# Patient Record
Sex: Female | Born: 1985 | Race: Black or African American | Hispanic: No | Marital: Married | Smoking: Never smoker
Health system: Southern US, Community
[De-identification: ages and names within clinical notes are randomized; demographics above are authoritative.]

## PROBLEM LIST (undated history)

## (undated) DIAGNOSIS — M2550 Pain in unspecified joint: Secondary | ICD-10-CM

## (undated) DIAGNOSIS — M7989 Other specified soft tissue disorders: Secondary | ICD-10-CM

## (undated) DIAGNOSIS — E559 Vitamin D deficiency, unspecified: Secondary | ICD-10-CM

## (undated) DIAGNOSIS — D649 Anemia, unspecified: Secondary | ICD-10-CM

## (undated) DIAGNOSIS — K5900 Constipation, unspecified: Secondary | ICD-10-CM

## (undated) DIAGNOSIS — Z8632 Personal history of gestational diabetes: Principal | ICD-10-CM

## (undated) HISTORY — DX: Personal history of gestational diabetes: Z86.32

## (undated) HISTORY — DX: Anemia, unspecified: D64.9

## (undated) HISTORY — DX: Vitamin D deficiency, unspecified: E55.9

## (undated) HISTORY — DX: Constipation, unspecified: K59.00

## (undated) HISTORY — DX: Other specified soft tissue disorders: M79.89

## (undated) HISTORY — DX: Pain in unspecified joint: M25.50

## (undated) HISTORY — PX: NO PAST SURGERIES: SHX2092

---

## 2016-03-29 NOTE — L&D Delivery Note (Signed)
Patient is a 31 y.o. now [id number] s/p NSVD at [redacted]w[redacted]d, who was admitted for IOL for A1GDM, started on IV Pitocin 080 this morning, and progressed quickly.   Delivery Note Patient SROM at 12:12 PM with clear fluid, and quickly delivered at 12:13 PM a viable female was delivered via Vaginal, Spontaneous Delivery. Arrived in the room, baby had just delivered and placenta quickly delivered spontaneously. Infant with good tone and spontaneous cry; dried and bulb suctioned. Cord clamped x 2 after 1-minute delay, and cut by family member. Cord blood drawn Fundus firm with massage and Pitocin. Perineum inspected and found to have no lacerations.  APGAR: 8, 9; weight pending. Placenta status: intact, sent to L&D; 3-vessel cord  Anesthesia:  Epidural Episiotomy: None Lacerations: None Suture Repair: none Est. Blood Loss (mL): 50  Mom to postpartum.  Baby to Couplet care / Skin to Skin.  Raynelle Fanning P. Degele, MD OB Fellow 12/15/16, 12:32 PM

## 2016-05-19 LAB — OB RESULTS CONSOLE GC/CHLAMYDIA
CHLAMYDIA, DNA PROBE: NEGATIVE
Gonorrhea: NEGATIVE

## 2016-05-19 LAB — OB RESULTS CONSOLE RPR: RPR: NONREACTIVE

## 2016-05-19 LAB — OB RESULTS CONSOLE ABO/RH: RH Type: POSITIVE

## 2016-05-19 LAB — OB RESULTS CONSOLE HGB/HCT, BLOOD
HCT: 33
Hemoglobin: 10.9

## 2016-05-19 LAB — OB RESULTS CONSOLE HIV ANTIBODY (ROUTINE TESTING): HIV: NONREACTIVE

## 2016-05-19 LAB — OB RESULTS CONSOLE ANTIBODY SCREEN: Antibody Screen: NEGATIVE

## 2016-05-19 LAB — OB RESULTS CONSOLE HEPATITIS B SURFACE ANTIGEN: HEP B S AG: NEGATIVE

## 2016-05-19 LAB — CYTOLOGY - PAP: PAP SMEAR: NEGATIVE

## 2016-05-19 LAB — OB RESULTS CONSOLE PLATELET COUNT: Platelets: 400

## 2016-05-19 LAB — OB RESULTS CONSOLE RUBELLA ANTIBODY, IGM: RUBELLA: IMMUNE

## 2016-10-21 ENCOUNTER — Ambulatory Visit (INDEPENDENT_AMBULATORY_CARE_PROVIDER_SITE_OTHER): Payer: BLUE CROSS/BLUE SHIELD | Admitting: Obstetrics and Gynecology

## 2016-10-21 ENCOUNTER — Encounter: Payer: Self-pay | Admitting: *Deleted

## 2016-10-21 ENCOUNTER — Encounter: Payer: Self-pay | Admitting: Obstetrics and Gynecology

## 2016-10-21 DIAGNOSIS — Z3480 Encounter for supervision of other normal pregnancy, unspecified trimester: Secondary | ICD-10-CM

## 2016-10-21 DIAGNOSIS — Z3483 Encounter for supervision of other normal pregnancy, third trimester: Secondary | ICD-10-CM

## 2016-10-21 DIAGNOSIS — Z6841 Body Mass Index (BMI) 40.0 and over, adult: Secondary | ICD-10-CM | POA: Insufficient documentation

## 2016-10-21 DIAGNOSIS — O0990 Supervision of high risk pregnancy, unspecified, unspecified trimester: Secondary | ICD-10-CM | POA: Insufficient documentation

## 2016-10-21 DIAGNOSIS — O99213 Obesity complicating pregnancy, third trimester: Secondary | ICD-10-CM | POA: Insufficient documentation

## 2016-10-21 DIAGNOSIS — E669 Obesity, unspecified: Secondary | ICD-10-CM | POA: Insufficient documentation

## 2016-10-21 NOTE — Progress Notes (Signed)
Prenatal Visit Note Date: 10/21/2016 [facility]  NOB  Transfer of [place] from [place] b/c father is ill  Subjective:  Cassandra Vazquez is a 31 y.o. G1P0 at 10330w1d being seen today for ongoing prenatal care.  She is currently monitored for the following issues for this high-risk pregnancy and has Supervision of other normal pregnancy, antepartum; Obesity in pregnancy, antepartum, third trimester; and BMI 45.0-49.9, adult (HCC) on her problem list.  Patient reports no complaints.   Contractions: Irregular. Vag. Bleeding: None.  Movement: Present. Denies leaking of fluid.   The following portions of the patient's history were reviewed and updated as appropriate: allergies, current medications, past family history, past medical history, past social history, past surgical history and problem list. Problem list updated.  Objective:   Vitals:   10/21/16 0825 10/21/16 0858  BP: 107/67   Pulse: 96   Temp: 98 F (36.7 C)   Weight: 272 lb (123.4 kg)   Height:  5\' 4"  (1.626 m)    Fetal Status: Fetal Heart Rate (bpm): 150s Fundal Height: 30 cm Movement: Present     General:  Alert, oriented and cooperative. Patient is in no acute distress.  Skin: Skin is warm and dry. No rash noted.   Cardiovascular: Normal heart rate noted  Respiratory: Normal respiratory effort, no problems with respiration noted  Abdomen: Soft, gravid, appropriate for gestational age. Pain/Pressure: Present     Pelvic:  Cervical exam deferred        Extremities: Normal range of motion.     Mental Status: Normal mood and affect. Normal behavior. Normal judgment and thought content.   Urinalysis:      Assessment and Plan:  Pregnancy: G1P0 at 5230w1d  1. Supervision of other normal pregnancy, antepartum Routine care. Pt didn't have an anatomy u/s. A limited u/s showed normal placenta and no previa. OCPs D/w her re: practice, hospital, residents, CNMs, etc - Glucose Tolerance, 2 Hours  w/1 Hour - CBC - RPR - HIV antibody (with reflex) - US MFM OB DETAIL +14 WK; Future  2. Obesity in pregnancy, antepartum, third trimester Routine care  3. BMI 45.0-49.9, adult (HCC) See above.   Preterm labor symptoms and general obstetric precautions including but not limited to vaginal bleeding, contractions, leaking of fluid and fetal movement were reviewed in detail with the patient. Please refer to After Visit Summary for other counseling recommendations.  Return in about 2 weeks (around 11/04/2016).   Walden BingPickens, Glendal Cassaday, MD

## 2016-10-22 ENCOUNTER — Encounter: Payer: Self-pay | Admitting: Obstetrics and Gynecology

## 2016-10-22 DIAGNOSIS — Z8632 Personal history of gestational diabetes: Secondary | ICD-10-CM

## 2016-10-22 DIAGNOSIS — O09299 Supervision of pregnancy with other poor reproductive or obstetric history, unspecified trimester: Secondary | ICD-10-CM

## 2016-10-22 HISTORY — DX: Personal history of gestational diabetes: Z86.32

## 2016-10-22 HISTORY — DX: Supervision of pregnancy with other poor reproductive or obstetric history, unspecified trimester: O09.299

## 2016-10-22 LAB — GLUCOSE TOLERANCE, 2 HOURS W/ 1HR
GLUCOSE, 1 HOUR: 180 mg/dL — AB (ref 65–179)
GLUCOSE, 2 HOUR: 164 mg/dL — AB (ref 65–152)
Glucose, Fasting: 84 mg/dL (ref 65–91)

## 2016-10-22 LAB — CBC
HEMATOCRIT: 31.8 % — AB (ref 34.0–46.6)
HEMOGLOBIN: 10.3 g/dL — AB (ref 11.1–15.9)
MCH: 26.3 pg — AB (ref 26.6–33.0)
MCHC: 32.4 g/dL (ref 31.5–35.7)
MCV: 81 fL (ref 79–97)
Platelets: 357 10*3/uL (ref 150–379)
RBC: 3.92 x10E6/uL (ref 3.77–5.28)
RDW: 14.1 % (ref 12.3–15.4)
WBC: 10.8 10*3/uL (ref 3.4–10.8)

## 2016-10-22 LAB — RPR: RPR: NONREACTIVE

## 2016-10-22 LAB — HIV ANTIBODY (ROUTINE TESTING W REFLEX): HIV SCREEN 4TH GENERATION: NONREACTIVE

## 2016-10-25 ENCOUNTER — Ambulatory Visit (HOSPITAL_COMMUNITY)
Admission: RE | Admit: 2016-10-25 | Discharge: 2016-10-25 | Disposition: A | Discharge status: Home / Self Care | Payer: BLUE CROSS/BLUE SHIELD | Source: Ambulatory Visit | Attending: Obstetrics and Gynecology | Admitting: Obstetrics and Gynecology

## 2016-10-25 ENCOUNTER — Telehealth: Payer: Self-pay | Admitting: *Deleted

## 2016-10-25 DIAGNOSIS — O321XX0 Maternal care for breech presentation, not applicable or unspecified: Secondary | ICD-10-CM | POA: Diagnosis not present

## 2016-10-25 DIAGNOSIS — E669 Obesity, unspecified: Secondary | ICD-10-CM | POA: Diagnosis not present

## 2016-10-25 DIAGNOSIS — O99213 Obesity complicating pregnancy, third trimester: Secondary | ICD-10-CM | POA: Insufficient documentation

## 2016-10-25 DIAGNOSIS — Z3A31 31 weeks gestation of pregnancy: Secondary | ICD-10-CM | POA: Insufficient documentation

## 2016-10-25 DIAGNOSIS — Z369 Encounter for antenatal screening, unspecified: Secondary | ICD-10-CM | POA: Diagnosis not present

## 2016-10-25 DIAGNOSIS — Z3483 Encounter for supervision of other normal pregnancy, third trimester: Secondary | ICD-10-CM | POA: Diagnosis present

## 2016-10-25 DIAGNOSIS — Z3480 Encounter for supervision of other normal pregnancy, unspecified trimester: Secondary | ICD-10-CM

## 2016-10-25 DIAGNOSIS — O24419 Gestational diabetes mellitus in pregnancy, unspecified control: Secondary | ICD-10-CM

## 2016-10-25 MED ORDER — ACCU-CHEK FASTCLIX LANCETS MISC: 1.0000 [IU] | Freq: Four times a day (QID) | 12 refills | Status: DC

## 2016-10-25 MED ORDER — ACCU-CHEK NANO SMARTVIEW W/DEVICE KIT: 1.0000 | PACK | 0 refills | Status: DC

## 2016-10-25 MED ORDER — GLUCOSE BLOOD VI STRP: ORAL_STRIP | 12 refills | Status: DC

## 2016-10-25 NOTE — Telephone Encounter (Signed)
-----   Message from [place] Bingharlie Pickens, MD sent at 10/22/2016 10:54 AM EDT ----- Pt has gdm. Please set her up per protocol. thanks

## 2016-10-25 NOTE — Telephone Encounter (Signed)
Called pt to adv GDM - LM for her to rtn call.  Sent Supplies to pharmacy and placed referral to N&D

## 2016-11-03 ENCOUNTER — Encounter: Payer: BLUE CROSS/BLUE SHIELD | Attending: Obstetrics and Gynecology | Admitting: Registered"

## 2016-11-03 DIAGNOSIS — Z713 Dietary counseling and surveillance: Secondary | ICD-10-CM | POA: Diagnosis not present

## 2016-11-03 DIAGNOSIS — O24419 Gestational diabetes mellitus in pregnancy, unspecified control: Secondary | ICD-10-CM | POA: Diagnosis not present

## 2016-11-03 DIAGNOSIS — R7309 Other abnormal glucose: Secondary | ICD-10-CM

## 2016-11-04 ENCOUNTER — Encounter: Payer: Self-pay | Admitting: Registered"

## 2016-11-04 ENCOUNTER — Ambulatory Visit (INDEPENDENT_AMBULATORY_CARE_PROVIDER_SITE_OTHER): Payer: BLUE CROSS/BLUE SHIELD | Admitting: Obstetrics & Gynecology

## 2016-11-04 VITALS — BP 111/71 | HR 94 | Wt 269.0 lb

## 2016-11-04 DIAGNOSIS — Z23 Encounter for immunization: Secondary | ICD-10-CM | POA: Diagnosis not present

## 2016-11-04 DIAGNOSIS — O0993 Supervision of high risk pregnancy, unspecified, third trimester: Secondary | ICD-10-CM

## 2016-11-04 DIAGNOSIS — O24419 Gestational diabetes mellitus in pregnancy, unspecified control: Secondary | ICD-10-CM

## 2016-11-04 MED ORDER — GLUCOSE BLOOD VI STRP: ORAL_STRIP | 12 refills | Status: DC

## 2016-11-04 NOTE — Patient Instructions (Addendum)
Return to clinic for any scheduled appointments or obstetric concerns, or go to [facility]U for evaluation   Gestational Diabetes Mellitus, Self Care Caring for yourself after you have been diagnosed with gestational diabetes (gestational diabetes mellitus) means keeping your blood sugar (glucose) under control with a balance of:  Nutrition.  Exercise.  Lifestyle changes.  Medicines or insulin, if necessary.  Support from your team of health care providers and others.  The following information explains what you need to know to manage your gestational diabetes at home. What do I need to do to manage my blood glucose?  Check your blood glucose every day during your pregnancy. Do this as often as told by your health care provider.  Contact your health care provider if your blood glucose is above your target for 2 tests in a row. Your health care provider will set individualized treatment goals for you. Generally, the goal of treatment is to maintain the following blood glucose levels during pregnancy:  After not eating for 8 hours (after fasting): at or below 95 mg/dL (5.3 mmol/L).  After meals (postprandial): ? One hour after a meal: at or below 140 mg/dL (7.8 mmol/L). ? Two hours after a meal: at or below 120 mg/dL (6.7 mmol/L).  A1c (hemoglobin A1c) level: 6-6.5%.  What do I need to know about hyperglycemia and hypoglycemia? What is hyperglycemia? Hyperglycemia, also called high blood glucose, occurs when blood glucose is too high. Make sure you know the early signs of hyperglycemia, such as:  Increased thirst.  Hunger.  Feeling very tired.  Needing to urinate more often than usual.  Blurry vision.  What is hypoglycemia? Hypoglycemia, also called low blood glucose, occurswith a blood glucose level at or below 70 mg/dL (3.9 mmol/L). The risk for hypoglycemia increases during or after exercise, during sleep, during illness, and when skipping meals or not eating for a long time  (fasting). It is important to know the symptoms of hypoglycemia and treat it right away. Always have a 15-gram rapid-acting carbohydrate snack with you to treat low blood glucose.Family members and close friends should also know the symptoms and should understand how to treat hypoglycemia, in case you are not able to treat yourself. What are the symptoms of hypoglycemia? Hypoglycemia symptoms can include:  Hunger.  Anxiety.  Sweating and feeling clammy.  Confusion.  Dizziness or feeling light-headed.  Sleepiness.  Nausea.  Increased heart rate.  Headache.  Blurry vision.  Seizure.  Nightmares.  Tingling or numbness around the mouth, lips, or tongue.  A change in speech.  Decreased ability to concentrate.  A change in coordination.  Restless sleep.  Tremors or shakes.  Fainting.  Irritability.  How do I treat hypoglycemia?  If you are alert and able to swallow safely, follow the 15:15 rule:  Take 15 grams of a rapid-acting carbohydrate. Rapid-acting options include: ? 1 tube of glucose gel. ? 3 glucose pills. ? 6-8 pieces of hard candy. ? 4 oz (120 mL) of fruit juice. ? 4 oz (120 mL) of regular (not diet) soda.  Check your blood glucose 15 minutes after you take the carbohydrate.  If the repeat blood glucose level is still at or below 70 mg/dL (3.9 mmol/L), take 15 grams of a carbohydrate again.  If your blood glucose level does not increase above 70 mg/dL (3.9 mmol/L) after 3 tries, seek emergency medical care.  After your blood glucose level returns to normal, eat a meal or a snack within 1 hour.  How do  I treat severe hypoglycemia? Severe hypoglycemia is when your blood glucose level is at or below 54 mg/dL (3 mmol/L). Severe hypoglycemia is an emergency. Do not wait to see if the symptoms will go away. Get medical help right away. Call your local emergency services (911 in the [place] Do not drive yourself to the hospital. If you have severe  hypoglycemia and you cannot eat or drink, you may need an injection of glucagon. A family member or close friend should learn how to check your blood glucose and how to give you a glucagon injection. Ask your health care provider if you need to have an emergency glucagon injection kit available. Severe hypoglycemia may need to be treated in a hospital. The treatment may include getting glucose through an IV tube. You may also need treatment for the cause of your hypoglycemia. What else can I do to manage my gestational diabetes? Take your diabetes medicines as told  If your health care provider prescribed insulin or diabetes medicines, take them every day.  Do not run out of insulin or other diabetes medicines that you take. Plan ahead so you always have these available.  If you use insulin, adjust your dosage based on how physically active you are and what foods you eat. Your health care provider will tell you how to adjust your dosage. Make healthy food choices  The things that you eat and drink affect your blood glucose. Making good choices helps to control your diabetes and prevent other health problems. A healthy meal plan includes eating lean proteins, complex carbohydrates, fresh fruits and vegetables, low-fat dairy products, and healthy fats. Make an appointment to see a diet and nutrition specialist (registered dietitian) to help you create an eating plan that is right for you. Make sure that you:  Follow instructions from your health care provider about eating or drinking restrictions.  Drink enough fluid to keep your urine clear or pale yellow.  Eat healthy snacks between nutritious meals.  Track the carbohydrates that you eat. Do this by reading food labels and learning the standard serving sizes of foods.  Follow your sick day plan whenever you cannot eat or drink as usual. Make this plan in advance with your health care provider.  Stay active   Do at least 30 minutes of  physical activity a day, or as much physical activity as your health care provider recommends during your pregnancy. ? Doing 10 minutes of exercise 30 minutes after each meal may help to control postprandial blood glucose levels.  If you start a new exercise or activity, work with your health care provider to adjust your insulin, medicines, or food intake as needed. Make healthy lifestyle choices  Do not drink alcohol.  Do not use any tobacco products, such as cigarettes, chewing tobacco, and e-cigarettes. If you need help quitting, ask your health care provider.  Learn to manage stress. If you need help with this, ask your health care provider. Care for your body  Keep your immunizations up to date.  Brush your teeth and gums two times a day, and floss at least one time a day.  Visit your dentist at least once every 6 months.  Maintain a healthy weight during your pregnancy. General instructions   Take over-the-counter and prescription medicines only as told by your health care provider.  Talk with your health care provider about your risk for high blood pressure during pregnancy (preeclampsia or eclampsia).  Share your diabetes management plan with people in your  workplace, school, and household.  Check your urine for ketones during your pregnancy when you are ill and as told by your health care provider.  Carry a medical alert card or wear medical alert jewelry.  Ask your health care provider: ? Do I need to meet with a diabetes educator? ? Where can I find a support group for people with diabetes?  Keep all follow-up visits during your pregnancy (prenatal) and after delivery (postnatal) as told by your health care provider. This is important. Get the care that you need after delivery  Have your blood glucose level checked 4-12 weeks after delivery. This is done with an oral glucose tolerance test (OGTT).  Get screened for diabetes at least every 3 years, or as often as  told by your health care provider. Where to find more information: To learn more about gestational diabetes, visit:  American Diabetes Association (ADA): www.diabetes.org/diabetes-basics/gestational  Centers for Disease Control and Prevention (CDC): http://sanchez-watson.com/.pdf  This information is not intended to replace advice given to you by your health care provider. Make sure you discuss any questions you have with your health care provider. Document Released: 07/07/2015 Document Revised: 08/21/2015 Document Reviewed: 04/18/2015 Elsevier Interactive Patient Education  2017 [employer].

## 2016-11-04 NOTE — Progress Notes (Addendum)
   PRENATAL VISIT NOTE  Subjective:  Marjorie Whitelock is a [age] y.Myna Brighto. G3P2002 at 6414w1d being seen today for ongoing prenatal care.  She is currently monitored for the following issues for this high-risk pregnancy and has Supervision of high-risk pregnancy; Obesity in pregnancy, antepartum, third trimester; BMI 45.0-49.9, adult (HCC); and GDM (gestational diabetes mellitus) on her problem list.  Patient reports dark brown discharge x 1 episode, no blood. Feels it is dirt and sweat, does not see any more when she wipes.   Contractions: Not present. Vag. Bleeding: None.  Movement: Present. Denies leaking of fluid.   The following portions of the patient's history were reviewed and updated as appropriate: allergies, current medications, past family history, past medical history, past social history, past surgical history and problem list. Problem list updated.  Objective:   Vitals:   11/04/16 0845  BP: 111/71  Pulse: 94  Weight: 269 lb (122 kg)    Fetal Status: Fetal Heart Rate (bpm): 147 Fundal Height: 33 cm Movement: Present     General:  Alert, oriented and cooperative. Patient is in no acute distress.  Skin: Skin is warm and dry. No rash noted.   Cardiovascular: Normal heart rate noted  Respiratory: Normal respiratory effort, no problems with respiration noted  Abdomen: Soft, gravid, appropriate for gestational age.  Pain/Pressure: Absent     Pelvic: Cervical exam deferred        Extremities: Normal range of motion.  Edema: None  Mental Status:  Normal mood and affect. Normal behavior. Normal judgment and thought content.   Assessment and Plan:  Pregnancy: G3P2002 at 1929w1d  1. Gestational diabetes mellitus (GDM), antepartum, gestational diabetes method of control unspecified Just had DM education yesterday. Will evaluate blood sugars next visit.  Growth scan ordered 37-38 weeks. - glucose blood test strip; Use as instructed  Dispense: 100 each; Refill: 12 - US MFM OB FOLLOW UP;  Future  2. Supervision of high risk pregnancy in third trimester - Tdap vaccine greater than or equal to 29yo IM Preterm labor symptoms and general obstetric precautions including but not limited to vaginal bleeding, contractions, leaking of fluid and fetal movement were reviewed in detail with the patient. Please refer to After Visit Summary for other counseling recommendations.  Return in about 2 weeks (around 11/18/2016) for OB Visit.   Jaynie CollinsUgonna Naw Lasala, MD

## 2016-11-04 NOTE — Progress Notes (Signed)
Pt has dark brown vaginal discharge today

## 2016-11-04 NOTE — Addendum Note (Signed)
Addended by: Jaynie CollinsANYANWU, Connor Foxworthy A on: 11/04/2016 09:12 AM   Modules accepted: Orders

## 2016-11-04 NOTE — Progress Notes (Signed)
Patient was seen on 11/03/2016 for Gestational Diabetes self-management class at the [facility]. The following learning objectives were met by the patient during this course:   States the definition of Gestational Diabetes  States why dietary management is important in controlling blood glucose  Describes the effects each nutrient has on blood glucose levels  Demonstrates ability to create a balanced meal plan  Demonstrates carbohydrate counting   States when to check blood glucose levels  Demonstrates proper blood glucose monitoring techniques  States the effect of stress and exercise on blood glucose levels  States the importance of limiting caffeine and abstaining from alcohol and smoking  Blood glucose monitor given: Contour Next Lot # [id number] Exp: 2016-12-26 Blood glucose reading: 90  Patient instructed to monitor glucose levels: FBS: 60 - <95 1 hour: <140 2 hour: <120  Patient received handouts:  Nutrition Diabetes and Pregnancy  Carbohydrate Counting List  Patient will be seen for follow-up as needed.

## 2016-11-08 ENCOUNTER — Ambulatory Visit: Payer: BLUE CROSS/BLUE SHIELD | Admitting: Family Medicine

## 2016-11-08 ENCOUNTER — Ambulatory Visit (INDEPENDENT_AMBULATORY_CARE_PROVIDER_SITE_OTHER): Payer: BLUE CROSS/BLUE SHIELD | Admitting: Family

## 2016-11-08 ENCOUNTER — Encounter: Payer: Self-pay | Admitting: Family

## 2016-11-08 VITALS — BP 118/76 | HR 89 | Temp 98.1°F | Ht 64.0 in | Wt 273.2 lb

## 2016-11-08 DIAGNOSIS — Z111 Encounter for screening for respiratory tuberculosis: Secondary | ICD-10-CM

## 2016-11-08 DIAGNOSIS — Z0000 Encounter for general adult medical examination without abnormal findings: Secondary | ICD-10-CM | POA: Insufficient documentation

## 2016-11-08 DIAGNOSIS — O99213 Obesity complicating pregnancy, third trimester: Secondary | ICD-10-CM

## 2016-11-08 NOTE — Progress Notes (Signed)
Pre visit review using our clinic review tool, if applicable. No additional management support is needed unless otherwise documented below in the visit note. 

## 2016-11-08 NOTE — Patient Instructions (Signed)
Such a pleasure meeting you!  I wish you a healthy last 4 weeks of pregnancy and healthy baby.   Come back after and we will discuss nutrition :)

## 2016-11-08 NOTE — Progress Notes (Signed)
Subjective:    Patient ID: Cassandra Vazquez, female    DOB: 23-Dec-1985, 31 y.o.   MRN: [id number]  CC: [name] is a 31 y.o. female who presents today to establish care.    HPI: Pregnant- 36 weeks  Over all feels well, is tired and thinks her weight is contributing.   Frustrated by weight gain during this pregnancy and feels "unhealthy".  Tearful when thinks about her health and health of her parents from weight.   OB- follows with [facility] for pap smear  Would like form filled out for [employer]  Denies exertional chest pain or pressure, numbness or tingling radiating to left arm or jaw, palpitations, dizziness, frequent headaches, changes in vision, or shortness of breath.      HISTORY:  History reviewed. No pertinent past medical history. No past surgical history on file. Family History  Problem Relation Age of Onset  . Diabetes Mother   . Diabetes Father   . CVA Father     Allergies: Patient has no known allergies. Current Outpatient Prescriptions on File Prior to Visit  Medication Sig Dispense Refill  . ACCU-CHEK FASTCLIX LANCETS MISC 1 Units by Percutaneous route 4 (four) times daily. 100 each 12  . Blood Glucose Monitoring Suppl (ACCU-CHEK NANO SMARTVIEW) w/Device KIT 1 kit by Subdermal route as directed. Check blood sugars for fasting, and two hours after breakfast, lunch and dinner (4 checks daily) 1 kit 0  . glucose blood test strip Use as instructed 100 each 12   No current facility-administered medications on file prior to visit.     Social History  Substance Use Topics  . Smoking status: Never Smoker  . Smokeless tobacco: Never Used  . Alcohol use No    Review of Systems  Constitutional: Positive for fatigue. Negative for chills and fever.  Respiratory: Negative for cough.   Cardiovascular: Negative for chest pain and palpitations.  Gastrointestinal: Negative for nausea and vomiting.      Objective:    BP 118/76   Pulse 89    Temp 98.1 F (36.7 C) (Oral)   Ht _0  (1.626 m)   Wt 273 lb 3.2 oz (123.9 kg)   LMP 03/17/2016   SpO2 96%   BMI 46.89 kg/m  BP Readings from Last 3 Encounters:  11/08/16 118/76  11/04/16 111/71  10/21/16 107/67   Wt Readings from Last 3 Encounters:  11/08/16 273 lb 3.2 oz (123.9 kg)  11/04/16 269 lb (122 kg)  10/21/16 272 lb (123.4 kg)    Physical Exam  Constitutional: She appears well-developed and well-nourished.  Eyes: Conjunctivae are normal.  Cardiovascular: Normal rate, regular rhythm, normal heart sounds and normal pulses.   Pulmonary/Chest: Effort normal and breath sounds normal. She has no wheezes. She has no rhonchi. She has no rales.  Neurological: She is alert.  Skin: Skin is warm and dry.  Psychiatric: She has a normal mood and affect. Her speech is normal and behavior is normal. Thought content normal.  Vitals reviewed.      Assessment & Plan:   Problem List Items Addressed This Visit      Other   Obesity in pregnancy, antepartum, third trimester    Discussed with patient that  at this point, she should focus on delivering a healthy baby. Advised her to come back after baby and we would work together to get her back on a healthy track. We discussed nutrition consult and an exercise program.  PPD screening test - Primary    Pending form for [employer] after PPD read in 2 days.      Relevant Orders   PPD (Completed)       I am having Ms. Charnley maintain her ACCU-CHEK NANO SMARTVIEW, ACCU-CHEK FASTCLIX LANCETS, and glucose blood.   No orders of the defined types were placed in this encounter.   Return precautions given.   Risks, benefits, and alternatives of the medications and treatment plan prescribed today were discussed, and patient expressed understanding.   Education regarding symptom management and diagnosis given to patient on AVS.  Continue to follow with Burnard Hawthorne, FNP for routine health maintenance.     Marcos Eke and I agreed with plan.   Mable Paris, FNP

## 2016-11-08 NOTE — Assessment & Plan Note (Addendum)
Discussed with patient that  at this point, she should focus on delivering a healthy baby. Advised her to come back after baby and we would work together to get her back on a healthy track. We discussed nutrition consult and an exercise program.

## 2016-11-08 NOTE — Assessment & Plan Note (Signed)
Pending form for [employer] after PPD read in 2 days.

## 2016-11-10 ENCOUNTER — Ambulatory Visit: Payer: BLUE CROSS/BLUE SHIELD | Admitting: *Deleted

## 2016-11-10 DIAGNOSIS — Z111 Encounter for screening for respiratory tuberculosis: Secondary | ICD-10-CM

## 2016-11-10 LAB — TB SKIN TEST
Induration: 0 mm
TB SKIN TEST: NEGATIVE

## 2016-11-10 NOTE — Progress Notes (Signed)
Patient presented for PPD read.

## 2016-11-17 ENCOUNTER — Ambulatory Visit (INDEPENDENT_AMBULATORY_CARE_PROVIDER_SITE_OTHER): Payer: BLUE CROSS/BLUE SHIELD | Admitting: Family Medicine

## 2016-11-17 VITALS — BP 105/71 | HR 82 | Wt 273.0 lb

## 2016-11-17 DIAGNOSIS — O0993 Supervision of high risk pregnancy, unspecified, third trimester: Secondary | ICD-10-CM

## 2016-11-17 DIAGNOSIS — O24410 Gestational diabetes mellitus in pregnancy, diet controlled: Secondary | ICD-10-CM

## 2016-11-17 DIAGNOSIS — O99213 Obesity complicating pregnancy, third trimester: Secondary | ICD-10-CM

## 2016-11-17 NOTE — Progress Notes (Deleted)
   PRENATAL VISIT NOTE  Subjective:  Cassandra Vazquez is a 31 y.o. G3P2002 at [redacted]w[redacted]d being seen today for ongoing prenatal care.  She is currently monitored for the following issues for this {Blank single:19197::"high-risk","low-risk"} pregnancy and has Supervision of high-risk pregnancy; Obesity in pregnancy, antepartum, third trimester; BMI 45.0-49.9, adult (HCC); GDM (gestational diabetes mellitus); and PPD screening test on her problem list.  Patient reports {sx:14538}.  Contractions: Not present. Vag. Bleeding: None.  Movement: Present. Denies leaking of fluid.   Ran out early last week.  Fasting- 110s 2hr after eating 90s   The following portions of the patient's history were reviewed and updated as appropriate: allergies, current medications, past family history, past medical history, past social history, past surgical history and problem list. Problem list updated.  Objective:   Vitals:   11/17/16 1636  BP: 105/71  Pulse: 82  Weight: 273 lb (123.8 kg)    Fetal Status: Fetal Heart Rate (bpm): 150   Movement: Present     General:  Alert, oriented and cooperative. Patient is in no acute distress.  Skin: Skin is warm and dry. No rash noted.   Cardiovascular: Normal heart rate noted  Respiratory: Normal respiratory effort, no problems with respiration noted  Abdomen: Soft, gravid, appropriate for gestational age.  Pain/Pressure: Absent     Pelvic: {Blank single:19197::"Cervical exam performed","Cervical exam deferred"}        Extremities: Normal range of motion.  Edema: None  Mental Status:  Normal mood and affect. Normal behavior. Normal judgment and thought content.   Assessment and Plan:  Pregnancy: G3P2002 at [redacted]w[redacted]d  1. Supervision of high risk pregnancy in third trimester ***  2. Diet controlled gestational diabetes mellitus (GDM) in third trimester ***  3. Obesity in pregnancy, antepartum, third trimester 1 lb (0.454 kg)   {Blank single:19197::"Term","Preterm"} labor  symptoms and general obstetric precautions including but not limited to vaginal bleeding, contractions, leaking of fluid and fetal movement were reviewed in detail with the patient. Please refer to After Visit Summary for other counseling recommendations.  Return in about 2 weeks (around 12/01/2016) for Routine prenatal care.   Federico Flake, MD

## 2016-11-17 NOTE — Progress Notes (Signed)
Pt denies issues at this time 

## 2016-11-17 NOTE — Progress Notes (Signed)
   PRENATAL VISIT NOTE  Subjective:  Cassandra Vazquez is a 31 y.o. G3P2002 at [redacted]w[redacted]d being seen today for ongoing prenatal care.  She is currently monitored for the following issues for this low-risk pregnancy and has Supervision of high-risk pregnancy; Obesity in pregnancy, antepartum, third trimester; BMI 45.0-49.9, adult (HCC); GDM (gestational diabetes mellitus); and PPD screening test on her problem list.  Patient reports no complaints.  Contractions: Not present. Vag. Bleeding: None.  Movement: Present. Denies leaking of fluid.   Ran out of strips last week Reports fastings in the 110s PP in the 90s   The following portions of the patient's history were reviewed and updated as appropriate: allergies, current medications, past family history, past medical history, past social history, past surgical history and problem list. Problem list updated.  Objective:   Vitals:   11/17/16 1636  BP: 105/71  Pulse: 82  Weight: 273 lb (123.8 kg)    Fetal Status: Fetal Heart Rate (bpm): 150   Movement: Present     General:  Alert, oriented and cooperative. Patient is in no acute distress.  Skin: Skin is warm and dry. No rash noted.   Cardiovascular: Normal heart rate noted  Respiratory: Normal respiratory effort, no problems with respiration noted  Abdomen: Soft, gravid, appropriate for gestational age.  Pain/Pressure: Absent     Pelvic: Cervical exam deferred        Extremities: Normal range of motion.  Edema: None  Mental Status:  Normal mood and affect. Normal behavior. Normal judgment and thought content.   Assessment and Plan:  Pregnancy: G3P2002 at [redacted]w[redacted]d  1. Supervision of high risk pregnancy in third trimester- -UTD -Will defer GBS and GC/CT until next appointment  2. Diet controlled gestational diabetes mellitus (GDM) in third trimester - Discussed need to check sugars, has not been checking regularly.  - Per her report fasting are above goal but did not bring log today - [place] MFM  OB FOLLOW UP; Future  3. Obesity in pregnancy, antepartum, third trimester 1 lb (0.454 kg)   Preterm labor symptoms and general obstetric precautions including but not limited to vaginal bleeding, contractions, leaking of fluid and fetal movement were reviewed in detail with the patient. Please refer to After Visit Summary for other counseling recommendations.   Return in about 2 weeks (around 12/01/2016) for Routine prenatal care.   Federico Flake, MD

## 2016-11-30 ENCOUNTER — Ambulatory Visit (INDEPENDENT_AMBULATORY_CARE_PROVIDER_SITE_OTHER): Payer: BLUE CROSS/BLUE SHIELD | Admitting: Obstetrics and Gynecology

## 2016-11-30 VITALS — BP 119/75 | HR 98 | Wt 270.0 lb

## 2016-11-30 DIAGNOSIS — Z113 Encounter for screening for infections with a predominantly sexual mode of transmission: Secondary | ICD-10-CM

## 2016-11-30 DIAGNOSIS — O0993 Supervision of high risk pregnancy, unspecified, third trimester: Secondary | ICD-10-CM

## 2016-11-30 DIAGNOSIS — O24419 Gestational diabetes mellitus in pregnancy, unspecified control: Secondary | ICD-10-CM

## 2016-11-30 DIAGNOSIS — O99213 Obesity complicating pregnancy, third trimester: Secondary | ICD-10-CM

## 2016-11-30 DIAGNOSIS — Z6841 Body Mass Index (BMI) 40.0 and over, adult: Secondary | ICD-10-CM

## 2016-11-30 LAB — OB RESULTS CONSOLE GBS: STREP GROUP B AG: NEGATIVE

## 2016-11-30 MED ORDER — METFORMIN HCL 500 MG PO TABS: 500.0000 mg | ORAL_TABLET | Freq: Every day | ORAL | 1 refills | Status: DC

## 2016-11-30 NOTE — Progress Notes (Signed)
Prenatal Visit Note Date: 11/30/2016 Clinic: [facility]-  Subjective:  Cassandra Vazquez is a 31 y.o. G3P2002 at 2163w6d being seen today for ongoing prenatal care.  She is currently monitored for the following issues for this high-risk pregnancy and has Supervision of high-risk pregnancy; Obesity in pregnancy, antepartum, third trimester; BMI 45.0-49.9, adult (HCC); GDM, class A2; and PPD screening test on her problem list.  Patient reports no complaints.   Contractions: Irregular. Vag. Bleeding: None.  Movement: Present. Denies leaking of fluid.   The following portions of the patient's history were reviewed and updated as appropriate: allergies, current medications, past family history, past medical history, past social history, past surgical history and problem list. Problem list updated.  Objective:   Vitals:   11/30/16 1557  BP: 119/75  Pulse: 98  Weight: 270 lb (122.5 kg)    Fetal Status: Fetal Heart Rate (bpm): 148 Fundal Height: 37 cm Movement: Present  Presentation: Vertex  General:  Alert, oriented and cooperative. Patient is in no acute distress.  Skin: Skin is warm and dry. No rash noted.   Cardiovascular: Normal heart rate noted  Respiratory: Normal respiratory effort, no problems with respiration noted  Abdomen: Soft, gravid, appropriate for gestational age. Pain/Pressure: Absent     Pelvic:  Cervical exam deferred        Extremities: Normal range of motion.  Edema: None  Mental Status: Normal mood and affect. Normal behavior. Normal judgment and thought content.   Urinalysis:      Assessment and Plan:  Pregnancy: G3P2002 at 8463w6d  1. Supervision of high risk pregnancy in third trimester D/w pt re: BC nv.  - Culture, beta strep (group b only) - Cervicovaginal ancillary only   2. GDM AM fastings in the 110s and 2h PP in the 90s-110s. D/w pt that recommend starting qhs medication given AM elevated BS values. rNST today (140 baseline, +accels, no  decel, mod variability, one UC x 6128m). NST/AFI later this week. Growth u/s and BPP next week. Will start with metformin 500mg  qhs.   Preterm labor symptoms and general obstetric precautions including but not limited to vaginal bleeding, contractions, leaking of fluid and fetal movement were reviewed in detail with the patient. Please refer to After Visit Summary for other counseling recommendations.  Return in about 3 days (around 12/03/2016) for nst/afi. 1wk nst/rob.   [employer] [facility] BingPickens, Claudell Rhody, MD

## 2016-12-01 LAB — BASIC METABOLIC PANEL
BUN/Creatinine Ratio: 20 (ref 9–23)
BUN: 10 mg/dL (ref 6–20)
CALCIUM: 9 mg/dL (ref 8.7–10.2)
CHLORIDE: 102 mmol/L (ref 96–106)
CO2: 20 mmol/L (ref 20–29)
Creatinine, Ser: 0.49 mg/dL — ABNORMAL LOW (ref 0.57–1.00)
GFR calc Af Amer: 152 mL/min/{1.73_m2} (ref >59)
GFR calc non Af Amer: 132 mL/min/{1.73_m2} (ref >59)
GLUCOSE: 51 mg/dL — AB (ref 65–99)
POTASSIUM: 4.2 mmol/L (ref 3.5–5.2)
SODIUM: 137 mmol/L (ref 134–144)

## 2016-12-02 LAB — CERVICOVAGINAL ANCILLARY ONLY
Chlamydia: NEGATIVE
Neisseria Gonorrhea: NEGATIVE

## 2016-12-04 LAB — CULTURE, BETA STREP (GROUP B ONLY): Strep Gp B Culture: NEGATIVE

## 2016-12-08 ENCOUNTER — Ambulatory Visit (INDEPENDENT_AMBULATORY_CARE_PROVIDER_SITE_OTHER): Payer: BLUE CROSS/BLUE SHIELD | Admitting: Student

## 2016-12-08 ENCOUNTER — Ambulatory Visit (HOSPITAL_COMMUNITY): Payer: BLUE CROSS/BLUE SHIELD

## 2016-12-08 VITALS — BP 117/75 | HR 94 | Wt 273.6 lb

## 2016-12-08 DIAGNOSIS — O24419 Gestational diabetes mellitus in pregnancy, unspecified control: Secondary | ICD-10-CM

## 2016-12-08 DIAGNOSIS — O0993 Supervision of high risk pregnancy, unspecified, third trimester: Secondary | ICD-10-CM

## 2016-12-08 NOTE — Progress Notes (Addendum)
   PRENATAL VISIT NOTE  Subjective:  Cassandra Vazquez is a 31 y.o. G3P2002 at 9365w0d being seen today for ongoing prenatal care.  She is currently monitored for the following issues for this high-risk pregnancy and has Supervision of high-risk pregnancy; Obesity in pregnancy, antepartum, third trimester; BMI 45.0-49.9, adult (HCC); GDM, class A2; and PPD screening test on her problem list.  Patient reports no complaints.  Contractions: Irregular. Vag. Bleeding: None.  Movement: Present. Denies leaking of fluid.   The following portions of the patient's history were reviewed and updated as appropriate: allergies, current medications, past family history, past medical history, past social history, past surgical history and problem list. Problem list updated.  Objective:   Vitals:   12/08/16 1633  BP: 117/75  Pulse: 94  Weight: 273 lb 9.6 oz (124.1 kg)    Fetal Status: Fetal Heart Rate (bpm): NST Fundal Height: 38 cm Movement: Present     General:  Alert, oriented and cooperative. Patient is in no acute distress.  Skin: Skin is warm and dry. No rash noted.   Cardiovascular: Normal heart rate noted  Respiratory: Normal respiratory effort, no problems with respiration noted  Abdomen: Soft, gravid, appropriate for gestational age.  Pain/Pressure: Present     Pelvic: Cervical exam deferred        Extremities: Normal range of motion.  Edema: None  Mental Status:  Normal mood and affect. Normal behavior. Normal judgment and thought content.   Assessment and Plan:  Pregnancy: G3P2002 at 2165w0d  1. Supervision of high risk pregnancy in third trimester See notes in point # 2. Given that patient does not have a BPP until next week and that she has not been taking her prescribed medication, NST and AFI were done at this visit.  - Fetal nonstress test: 140 bpm mod var, present acel, - dec, one UCX in 20 min - Amniotic fluid index-Total is 13; no evidence of polyhydramnios.   2. GDM, class A2 Patient  was recently given  RX for metformin 500 mg qhs on her visit on 11-30-2016 after her fastings were in the 110s. Patient says that she has not been taking her metformin but that her fastings are in the 90s after she stopped eating so late and stopped drinking soda in the past. She says that she did not think she needed her metformin because her fastings were now  in the 90s after this dietary change. However, patient has not brought her log and so I am unable to verify this information.  I emphasized to the patient the importance of taking her medication and the effects of uncontrolled diabetes on the fetus. Patient agrees to take her nightly metformin and continue her diabetic diet and sugar checks.   -Plan for induction at 39 weeks; patient has BPP appt on 12-15-2016.    Term labor symptoms and general obstetric precautions including but not limited to vaginal bleeding, contractions, leaking of fluid and fetal movement were reviewed in detail with the patient. Please refer to After Visit Summary for other counseling recommendations.  Return in about 1 week (around 12/15/2016).   Cassandra Vazquez, CNM

## 2016-12-09 ENCOUNTER — Other Ambulatory Visit: Payer: Self-pay | Admitting: Student

## 2016-12-09 ENCOUNTER — Telehealth (HOSPITAL_COMMUNITY): Payer: Self-pay | Admitting: *Deleted

## 2016-12-09 ENCOUNTER — Encounter: Payer: Self-pay | Admitting: *Deleted

## 2016-12-09 NOTE — Telephone Encounter (Signed)
Preadmission screen  

## 2016-12-10 ENCOUNTER — Other Ambulatory Visit: Payer: Self-pay | Admitting: Advanced Practice Midwife

## 2016-12-10 ENCOUNTER — Ambulatory Visit (HOSPITAL_COMMUNITY)
Admission: RE | Admit: 2016-12-10 | Discharge: 2016-12-10 | Disposition: A | Discharge status: Home / Self Care | Payer: BC Managed Care – PPO | Source: Ambulatory Visit | Attending: Obstetrics & Gynecology | Admitting: Obstetrics & Gynecology

## 2016-12-10 DIAGNOSIS — Z3A38 38 weeks gestation of pregnancy: Secondary | ICD-10-CM | POA: Insufficient documentation

## 2016-12-10 DIAGNOSIS — O24419 Gestational diabetes mellitus in pregnancy, unspecified control: Secondary | ICD-10-CM | POA: Diagnosis present

## 2016-12-10 DIAGNOSIS — O0993 Supervision of high risk pregnancy, unspecified, third trimester: Secondary | ICD-10-CM | POA: Diagnosis not present

## 2016-12-14 ENCOUNTER — Ambulatory Visit (INDEPENDENT_AMBULATORY_CARE_PROVIDER_SITE_OTHER): Payer: BC Managed Care – PPO | Admitting: Family Medicine

## 2016-12-14 VITALS — BP 100/65 | HR 88 | Wt 269.4 lb

## 2016-12-14 DIAGNOSIS — O0993 Supervision of high risk pregnancy, unspecified, third trimester: Secondary | ICD-10-CM

## 2016-12-14 DIAGNOSIS — O99213 Obesity complicating pregnancy, third trimester: Secondary | ICD-10-CM

## 2016-12-14 DIAGNOSIS — O24419 Gestational diabetes mellitus in pregnancy, unspecified control: Secondary | ICD-10-CM

## 2016-12-14 NOTE — Progress Notes (Signed)
   PRENATAL VISIT NOTE  Subjective:  Cassandra Vazquez is a 31 y.o. G3P2002 at [redacted]w[redacted]d being seen today for ongoing prenatal care.  She is currently monitored for the following issues for this high-risk pregnancy and has Supervision of high-risk pregnancy; Obesity in pregnancy, antepartum, third trimester; BMI 45.0-49.9, adult (HCC); GDM, class A2; PPD screening test; and NSVD (normal spontaneous vaginal delivery) on her problem list.  Patient reports mild contraction.  Contractions: Irregular.  .  Movement: Present. Denies leaking of fluid.   The following portions of the patient's history were reviewed and updated as appropriate: allergies, current medications, past family history, past medical history, past social history, past surgical history and problem list. Problem list updated.  Objective:   Vitals:   12/14/16 1621  BP: 100/65  Pulse: 88  Weight: 269 lb 6.4 oz (122.2 kg)    Fetal Status:     Movement: Present     General:  Alert, oriented and cooperative. Patient is in no acute distress.  Skin: Skin is warm and dry. No rash noted.   Cardiovascular: Normal heart rate noted  Respiratory: Normal respiratory effort, no problems with respiration noted  Abdomen: Soft, gravid, appropriate for gestational age.  Pain/Pressure: Present     Pelvic: Cervical exam performed        Extremities: Normal range of motion.  Edema: None  Mental Status:  Normal mood and affect. Normal behavior. Normal judgment and thought content.   Assessment and Plan:  Pregnancy: G3P2002 at [redacted]w[redacted]d  1. GDM, class A2 Well controlled sugars, has IOL scheduled  2. Supervision of high risk pregnancy in third trimester UTD  3. Obesity in pregnancy, antepartum, third trimester Stable  Term labor symptoms and general obstetric precautions including but not limited to vaginal bleeding, contractions, leaking of fluid and fetal movement were reviewed in detail with the patient. Please refer to After Visit Summary for  other counseling recommendations.   Future Appointments Date Time Provider Department Center  01/27/2017 10:00 AM Anyanwu, Jethro Bastos, MD [facility]    Federico Flake, MD

## 2016-12-15 ENCOUNTER — Inpatient Hospital Stay (HOSPITAL_COMMUNITY): Payer: BC Managed Care – PPO | Admitting: Anesthesiology

## 2016-12-15 ENCOUNTER — Ambulatory Visit (HOSPITAL_COMMUNITY): Payer: BLUE CROSS/BLUE SHIELD

## 2016-12-15 ENCOUNTER — Inpatient Hospital Stay (HOSPITAL_COMMUNITY)
Admission: RE | Admit: 2016-12-15 | Discharge: 2016-12-17 | DRG: 775 | Disposition: A | Discharge status: Home / Self Care | Payer: BC Managed Care – PPO | Source: Ambulatory Visit | Attending: Family Medicine | Admitting: Family Medicine

## 2016-12-15 ENCOUNTER — Encounter (HOSPITAL_COMMUNITY): Payer: Self-pay

## 2016-12-15 DIAGNOSIS — O99214 Obesity complicating childbirth: Secondary | ICD-10-CM | POA: Diagnosis present

## 2016-12-15 DIAGNOSIS — Z3A39 39 weeks gestation of pregnancy: Secondary | ICD-10-CM

## 2016-12-15 DIAGNOSIS — Z6841 Body Mass Index (BMI) 40.0 and over, adult: Secondary | ICD-10-CM | POA: Diagnosis not present

## 2016-12-15 DIAGNOSIS — O09299 Supervision of pregnancy with other poor reproductive or obstetric history, unspecified trimester: Secondary | ICD-10-CM

## 2016-12-15 DIAGNOSIS — O24420 Gestational diabetes mellitus in childbirth, diet controlled: Secondary | ICD-10-CM | POA: Diagnosis present

## 2016-12-15 DIAGNOSIS — Z8632 Personal history of gestational diabetes: Secondary | ICD-10-CM

## 2016-12-15 DIAGNOSIS — O99213 Obesity complicating pregnancy, third trimester: Secondary | ICD-10-CM | POA: Diagnosis present

## 2016-12-15 LAB — CBC
HEMATOCRIT: 32.7 % — AB (ref 36.0–46.0)
Hemoglobin: 10.8 g/dL — ABNORMAL LOW (ref 12.0–15.0)
MCH: 25.4 pg — ABNORMAL LOW (ref 26.0–34.0)
MCHC: 33 g/dL (ref 30.0–36.0)
MCV: 76.9 fL — AB (ref 78.0–100.0)
PLATELETS: 343 10*3/uL (ref 150–400)
RBC: 4.25 MIL/uL (ref 3.87–5.11)
RDW: 14.7 % (ref 11.5–15.5)
WBC: 10.5 10*3/uL (ref 4.0–10.5)

## 2016-12-15 LAB — GLUCOSE, CAPILLARY: Glucose-Capillary: 89 mg/dL (ref 65–99)

## 2016-12-15 LAB — TYPE AND SCREEN
ABO/RH(D): A POS
ANTIBODY SCREEN: NEGATIVE

## 2016-12-15 LAB — ABO/RH: ABO/RH(D): A POS

## 2016-12-15 MED ORDER — ACETAMINOPHEN 325 MG PO TABS: 650.0000 mg | ORAL_TABLET | ORAL | Status: DC | PRN

## 2016-12-15 MED ORDER — DIBUCAINE 1 % RE OINT: 1.0000 "application " | TOPICAL_OINTMENT | RECTAL | Status: DC | PRN

## 2016-12-15 MED ORDER — COCONUT OIL OIL: 1.0000 "application " | TOPICAL_OIL | Status: DC | PRN

## 2016-12-15 MED ORDER — LACTATED RINGERS IV SOLN: 500.0000 mL | INTRAVENOUS | Status: DC | PRN

## 2016-12-15 MED ORDER — IBUPROFEN 600 MG PO TABS
600.0000 mg | ORAL_TABLET | Freq: Four times a day (QID) | ORAL | Status: DC
  Administered 2016-12-15 – 2016-12-17 (×7): 600 mg via ORAL
  Filled 2016-12-15 (×7): qty 1

## 2016-12-15 MED ORDER — DIPHENHYDRAMINE HCL 50 MG/ML IJ SOLN: 12.5000 mg | INTRAMUSCULAR | Status: DC | PRN

## 2016-12-15 MED ORDER — TETANUS-DIPHTH-ACELL PERTUSSIS 5-2.5-18.5 LF-MCG/0.5 IM SUSP
0.5000 mL | Freq: Once | INTRAMUSCULAR | Status: DC
Start: 2016-12-16 — End: 2016-12-17

## 2016-12-15 MED ORDER — ONDANSETRON HCL 4 MG/2ML IJ SOLN: 4.0000 mg | INTRAMUSCULAR | Status: DC | PRN

## 2016-12-15 MED ORDER — OXYTOCIN 40 UNITS IN LACTATED RINGERS INFUSION - SIMPLE MED
2.5000 [IU]/h | INTRAVENOUS | Status: DC
  Filled 2016-12-15: qty 1000

## 2016-12-15 MED ORDER — PHENYLEPHRINE 40 MCG/ML (10ML) SYRINGE FOR IV PUSH (FOR BLOOD PRESSURE SUPPORT)
80.0000 ug | PREFILLED_SYRINGE | INTRAVENOUS | Status: DC | PRN
  Filled 2016-12-15: qty 5

## 2016-12-15 MED ORDER — LACTATED RINGERS IV SOLN
INTRAVENOUS | Status: DC
  Administered 2016-12-15: 08:00:00 via INTRAVENOUS

## 2016-12-15 MED ORDER — PHENYLEPHRINE 40 MCG/ML (10ML) SYRINGE FOR IV PUSH (FOR BLOOD PRESSURE SUPPORT)
80.0000 ug | PREFILLED_SYRINGE | INTRAVENOUS | Status: DC | PRN
  Filled 2016-12-15: qty 10
  Filled 2016-12-15: qty 5

## 2016-12-15 MED ORDER — TERBUTALINE SULFATE 1 MG/ML IJ SOLN
0.2500 mg | Freq: Once | INTRAMUSCULAR | Status: DC | PRN
  Filled 2016-12-15: qty 1

## 2016-12-15 MED ORDER — FENTANYL 2.5 MCG/ML BUPIVACAINE 1/10 % EPIDURAL INFUSION (WH - ANES)
14.0000 mL/h | INTRAMUSCULAR | Status: DC | PRN
  Administered 2016-12-15 (×2): 14 mL/h via EPIDURAL
  Filled 2016-12-15: qty 100

## 2016-12-15 MED ORDER — ONDANSETRON HCL 4 MG/2ML IJ SOLN
4.0000 mg | Freq: Four times a day (QID) | INTRAMUSCULAR | Status: DC | PRN
  Filled 2016-12-15: qty 2

## 2016-12-15 MED ORDER — PRENATAL MULTIVITAMIN CH
1.0000 | ORAL_TABLET | Freq: Every day | ORAL | Status: DC
  Administered 2016-12-16: 1 via ORAL
  Filled 2016-12-15: qty 1

## 2016-12-15 MED ORDER — SIMETHICONE 80 MG PO CHEW: 80.0000 mg | CHEWABLE_TABLET | ORAL | Status: DC | PRN

## 2016-12-15 MED ORDER — SENNOSIDES-DOCUSATE SODIUM 8.6-50 MG PO TABS
2.0000 | ORAL_TABLET | ORAL | Status: DC
  Administered 2016-12-15 – 2016-12-17 (×2): 2 via ORAL
  Filled 2016-12-15 (×2): qty 2

## 2016-12-15 MED ORDER — OXYTOCIN 40 UNITS IN LACTATED RINGERS INFUSION - SIMPLE MED
1.0000 m[IU]/min | INTRAVENOUS | Status: DC
Start: 2016-12-15 — End: 2016-12-15
  Administered 2016-12-15: 2 m[IU]/min via INTRAVENOUS

## 2016-12-15 MED ORDER — ACETAMINOPHEN 325 MG PO TABS
650.0000 mg | ORAL_TABLET | ORAL | Status: DC | PRN
  Administered 2016-12-15 – 2016-12-17 (×5): 650 mg via ORAL
  Filled 2016-12-15 (×5): qty 2

## 2016-12-15 MED ORDER — LIDOCAINE HCL (PF) 1 % IJ SOLN
INTRAMUSCULAR | Status: DC | PRN
  Administered 2016-12-15 (×2): 4 mL via EPIDURAL

## 2016-12-15 MED ORDER — BENZOCAINE-MENTHOL 20-0.5 % EX AERO: 1.0000 "application " | INHALATION_SPRAY | CUTANEOUS | Status: DC | PRN

## 2016-12-15 MED ORDER — WITCH HAZEL-GLYCERIN EX PADS: 1.0000 "application " | MEDICATED_PAD | CUTANEOUS | Status: DC | PRN

## 2016-12-15 MED ORDER — OXYCODONE-ACETAMINOPHEN 5-325 MG PO TABS: 1.0000 | ORAL_TABLET | ORAL | Status: DC | PRN

## 2016-12-15 MED ORDER — LIDOCAINE HCL (PF) 1 % IJ SOLN
30.0000 mL | INTRAMUSCULAR | Status: DC | PRN
  Filled 2016-12-15: qty 30

## 2016-12-15 MED ORDER — ONDANSETRON HCL 4 MG PO TABS: 4.0000 mg | ORAL_TABLET | ORAL | Status: DC | PRN

## 2016-12-15 MED ORDER — OXYCODONE-ACETAMINOPHEN 5-325 MG PO TABS: 2.0000 | ORAL_TABLET | ORAL | Status: DC | PRN

## 2016-12-15 MED ORDER — ZOLPIDEM TARTRATE 5 MG PO TABS: 5.0000 mg | ORAL_TABLET | Freq: Every evening | ORAL | Status: DC | PRN

## 2016-12-15 MED ORDER — DIPHENHYDRAMINE HCL 25 MG PO CAPS: 25.0000 mg | ORAL_CAPSULE | Freq: Four times a day (QID) | ORAL | Status: DC | PRN

## 2016-12-15 MED ORDER — FLEET ENEMA 7-19 GM/118ML RE ENEM: 1.0000 | ENEMA | Freq: Every day | RECTAL | Status: DC | PRN

## 2016-12-15 MED ORDER — EPHEDRINE 5 MG/ML INJ
10.0000 mg | INTRAVENOUS | Status: DC | PRN
  Filled 2016-12-15: qty 2

## 2016-12-15 MED ORDER — SOD CITRATE-CITRIC ACID 500-334 MG/5ML PO SOLN: 30.0000 mL | ORAL | Status: DC | PRN

## 2016-12-15 MED ORDER — OXYTOCIN BOLUS FROM INFUSION
500.0000 mL | Freq: Once | INTRAVENOUS | Status: AC
  Administered 2016-12-15: 500 mL via INTRAVENOUS

## 2016-12-15 MED ORDER — LACTATED RINGERS IV SOLN
500.0000 mL | Freq: Once | INTRAVENOUS | Status: AC
  Administered 2016-12-15: 500 mL via INTRAVENOUS

## 2016-12-15 MED ORDER — MISOPROSTOL 25 MCG QUARTER TABLET
25.0000 ug | ORAL_TABLET | ORAL | Status: DC | PRN
  Filled 2016-12-15: qty 1

## 2016-12-15 NOTE — Anesthesia Postprocedure Evaluation (Signed)
Anesthesia Post Note  Patient: Cassandra Vazquez  Procedure(s) Performed: * No procedures listed *     Patient location during evaluation: Mother Baby Anesthesia Type: Epidural Level of consciousness: awake, awake and alert, oriented and patient cooperative Pain management: pain level controlled Vital Signs Assessment: post-procedure vital signs reviewed and stable Respiratory status: spontaneous breathing, nonlabored ventilation and respiratory function stable Cardiovascular status: stable Postop Assessment: no headache, no backache, patient able to bend at knees and no apparent nausea or vomiting Anesthetic complications: no    Last Vitals:  Vitals:   12/15/16 1402 12/15/16 1518  BP: 118/66 140/82  Pulse: 76 78  Resp: 18   Temp: 36.6 C 36.6 C  SpO2:      Last Pain:  Vitals:   12/15/16 1518  TempSrc: Oral  PainSc: 4    Pain Goal:                 Lekisha Mcghee L

## 2016-12-15 NOTE — Anesthesia Preprocedure Evaluation (Signed)
Anesthesia Evaluation  Patient identified by MRN, date of birth, ID band Patient awake    Reviewed: Allergy & Precautions, H&P , NPO status , Patient's Chart, lab work & pertinent test results  History of Anesthesia Complications Negative for: history of anesthetic complications  Airway Mallampati: II  TM Distance: >3 FB Neck ROM: full    Dental no notable dental hx. (+) Teeth Intact   Pulmonary neg pulmonary ROS,    Pulmonary exam normal breath sounds clear to auscultation       Cardiovascular negative cardio ROS Normal cardiovascular exam Rhythm:regular Rate:Normal     Neuro/Psych negative neurological ROS  negative psych ROS   GI/Hepatic negative GI ROS, Neg liver ROS,   Endo/Other  diabetes, GestationalMorbid obesity  Renal/GU negative Renal ROS     Musculoskeletal   Abdominal (+) + obese,   Peds  Hematology negative hematology ROS (+)   Anesthesia Other Findings   Reproductive/Obstetrics (+) Pregnancy                             Anesthesia Physical Anesthesia Plan  ASA: III  Anesthesia Plan: Epidural   Post-op Pain Management:    Induction:   PONV Risk Score and Plan:   Airway Management Planned:   Additional Equipment:   Intra-op Plan:   Post-operative Plan:   Informed Consent: I have reviewed the patients History and Physical, chart, labs and discussed the procedure including the risks, benefits and alternatives for the proposed anesthesia with the patient or authorized representative who has indicated his/her understanding and acceptance.     Plan Discussed with:   Anesthesia Plan Comments:         Anesthesia Quick Evaluation

## 2016-12-15 NOTE — Anesthesia Pain Management Evaluation Note (Signed)
  [facility] Pain Management Visit Note  Patient: Cassandra Vazquez, 31 y.o., female  "Hello I am a member of the anesthesia team at [facility]. We have an anesthesia team available at all times to provide care throughout the hospital, including epidural management and anesthesia for C-section. I don't know your plan for the delivery whether it a natural birth, water birth, IV sedation, nitrous supplementation, doula or epidural, but we want to meet your pain goals."   1.Was your pain managed to your expectations on prior hospitalizations?   Yes   2.What is your expectation for pain management during this hospitalization?     Epidural  3.How can we help you reach that goal? epidural  Record the patient's initial score and the patient's pain goal.   Pain: 5/10  Pain Goal: 0/10 [facility] wants you to be able to say your pain was always managed very well.  Salome Arnt 12/15/2016

## 2016-12-15 NOTE — Anesthesia Procedure Notes (Signed)
Epidural Patient location during procedure: OB Start time: 12/15/2016 10:40 AM  Staffing Anesthesiologist: Karna Christmas P Performed: anesthesiologist   Preanesthetic Checklist Completed: patient identified, site marked, pre-op evaluation, timeout performed, IV checked, risks and benefits discussed and monitors and equipment checked  Epidural Patient position: sitting Prep: DuraPrep Patient monitoring: heart rate, cardiac monitor, continuous pulse ox and blood pressure Approach: midline Location: L3-L4 Injection technique: LOR air  Needle:  Needle type: Tuohy  Needle gauge: 17 G Needle length: 9 cm Needle insertion depth: 9 cm Catheter type: closed end flexible Catheter size: 19 Gauge Catheter at skin depth: 14 cm Test dose: negative and Other  Assessment Events: blood not aspirated, injection not painful, no injection resistance and negative IV test  Additional Notes Informed consent obtained prior to proceeding including risk of failure, 1% risk of PDPH, risk of minor discomfort and bruising.  Discussed rare but serious complications.  Discussed alternatives to epidural analgesia and patient desires to proceed.  Timeout performed pre-procedure verifying patient name, procedure, and platelet count.  Loss of resistance encountered on second attempt.Patient tolerated procedure well. Reason for block:procedure for pain

## 2016-12-15 NOTE — H&P (Signed)
OBSTETRIC ADMISSION HISTORY AND PHYSICAL  Cassandra Vazquez is a 31 y.o. female G3P2002 with IUP at 67w0dby LMP presenting for IOL for A2GDM. She reports +FMs, No LOF, minimal VB, no blurry vision, headaches or peripheral edema, and RUQ pain.  She plans on breast feeding. She is considering POP's for birth control after delivery. She has not been taking her prescribed metformin, as she felt she was able to control her blood sugars with diet control. She began to feel contractions yesterday AM, about every 20 minutes. They were about every 7 minutes as of yesterday evening. Her previously pregnancies were uncomplicated and were delivered via SVD.  [facility]  Dating: By LMP on 03/17/16 --->  Estimated Date of Delivery: 12/22/16  Sono:   '@[redacted]w[redacted]d' , CWD, normal anatomy, cephalic presentation, est. FW 3215 g, 62% EFW  Prenatal History/Complications: [facility] [place] [facility] [place] [facility]  Dating LMP=9wk u/s Blood type: A/Positive/-- (02/21 0000)   Genetic Screen  none Antibody:Negative (02/21 0000)  Anatomic [place] - limited but normal. Neg but incomplete. '[ ]'  rpt at 38wks Rubella: Immune (02/21 0000)  GTT Third trimester:  RPR: Nonreactive (02/21 0000)   Flu vaccine N/A HBsAg: Negative (02/21 0000)   TDaP vaccine 11/04/16 HIV: Non-reactive (02/21 0000)   Baby Food  Breast/bottle                                             GBS: neg  Contraception Condoms/Rhythm method Pap:05/19/16 - Normal  Circumcision n/a   Pediatrician [facility]   Support Person FOB: Jabari   Prenatal Classes No Hgb electrophoresis:     Past Medical History: History reviewed. No pertinent past medical history.  Past Surgical History: History reviewed. No pertinent surgical history.  Obstetrical History: OB History    Gravida Para Term Preterm AB Living   '3 2 2     2   ' SAB TAB Ectopic Multiple Live Births           2      Obstetric Comments   224and 2013 TSVD; no  issues or problems during those pregnancies. Largest was 8lbs 15oz. Both children are healthy      Social History: Social History   Social History  . Marital status: Married    Spouse name: N/A  . Number of children: N/A  . Years of education: N/A   Social History Main Topics  . Smoking status: Never Smoker  . Smokeless tobacco: Never Used  . Alcohol use No  . Drug use: No  . Sexual activity: Yes   Other Topics Concern  . 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 Diabetes Mother   . Diabetes Father   . CVA Father     Allergies: No Known Allergies  Prescriptions Prior to Admission  Medication Sig Dispense Refill Last Dose  . ACCU-CHEK FASTCLIX LANCETS MISC 1 Units by Percutaneous route 4 (four) times daily. 100 each 12 Taking  . Blood Glucose Monitoring Suppl (ACCU-CHEK NANO SMARTVIEW) w/Device KIT 1 kit by Subdermal route as directed. Check blood sugars for fasting, and two hours after breakfast, lunch and dinner (4 checks  daily) 1 kit 0 Taking  . glucose blood test strip Use as instructed 100 each 12 Taking  . metFORMIN (GLUCOPHAGE) 500 MG tablet Take 1 tablet (500 mg total) by mouth at bedtime. 30 tablet 1 Taking     Review of Systems   All systems reviewed and negative except as stated in HPI  Blood pressure 119/85, pulse 89, resp. rate 16, height '5\' 4"'  (1.626 m), weight 122 kg (269 lb), last menstrual period 03/17/2016. General appearance: alert, cooperative and no distress. Sitting comfortably in bed with husband at bedside. Abdomen: soft, non-tender; bowel sounds normal Pelvic: Deferred Extremities: Homans sign is negative, no sign of DVT Presentation: cephalic Fetal monitoringBaseline: 140 bpm, Variability: Good {> 6 bpm), Accelerations: Reactive and Decelerations: Absent Uterine activityFrequency: Every 3-4 minutes Dilation:  4.5 Effacement (%): 70 Station: -2 Exam by:: J.Thornton< RN    Prenatal labs: ABO, Rh: A/Positive/-- (02/21 0000) Antibody: Negative (02/21 0000) Rubella: Immune (02/21 0000) RPR: Non Reactive (07/26 0851)  HBsAg: Negative (02/21 0000)  HIV: Non-reactive (02/21 0000)  GBS: Negative (09/04 0000)  1 hr Glucola 180, 2 hr 164 Genetic screening  none Anatomy US 08/02/16 - limited but normal. Neg but incomplete. Repeat 10/25/16  Limited views of the fetal anatomy obtained due to late gestational age and fetal position. No gross anomalies noted.  Prenatal Transfer Tool  Maternal Diabetes: Yes:  Diabetes Type:  Insulin/Medication controlled, Diet controlled Genetic Screening: Declined Maternal Ultrasounds/Referrals: Normal Fetal Ultrasounds or other Referrals:  None Maternal Substance Abuse:  No Significant Maternal Medications:  None Significant Maternal Lab Results: Lab values include: Group B Strep negative  Results for orders placed or performed during the hospital encounter of 12/15/16 (from the past 24 hour(s))  CBC   Collection Time: 12/15/16  7:52 AM  Result Value Ref Range   WBC 10.5 4.0 - 10.5 K/uL   RBC 4.25 3.87 - 5.11 MIL/uL   Hemoglobin 10.8 (L) 12.0 - 15.0 g/dL   HCT 32.7 (L) 36.0 - 46.0 %   MCV 76.9 (L) 78.0 - 100.0 fL   MCH 25.4 (L) 26.0 - 34.0 pg   MCHC 33.0 30.0 - 36.0 g/dL   RDW 14.7 11.5 - 15.5 %   Platelets 343 150 - 400 K/uL  Glucose, capillary   Collection Time: 12/15/16  7:55 AM  Result Value Ref Range   Glucose-Capillary 89 65 - 99 mg/dL    Patient Active Problem List   Diagnosis Date Noted  . Normal labor 12/15/2016  . PPD screening test 11/08/2016  . GDM, class A2 10/22/2016  . Supervision of high-risk pregnancy 10/21/2016  . Obesity in pregnancy, antepartum, third trimester 10/21/2016  . BMI 45.0-49.9, adult (B and E) 10/21/2016    Assessment/Plan:  Cassandra Vazquez is a 31 y.o. G3P2002 at 70w0dhere for IOL due to A1GDM. Has not been taking  prescribed metformin, but has been controlling with diet. Is currently comfortable.  #Labor: Pit started 2x2 #Pain:  Currently minimal discomofort with contractions. Plans to get epidural. #FWB: Category 1 strip #ID:  GBS negative #MOF: Breast feeding #MOC: Considering POP's #Circ:  N/A  ADahlia Bailiff Medical Student  12/15/2016, 9:37 AM  I have seen and evaluated the patient independently and agree with the assessment and plan as listed above. MRegenia Skeeter DO PGY-2 Family Medicine Resident   OB FELLOW MEDICAL STUDENT NOTE ATTESTATION  I confirm that I have verified the information documented in the medical student's note and that I have also personally performed the physical  exam and all medical decision making activities. A1GDM (she was prescribed metformin, but never started taking it; reports making further dietary changes, and reports CBGs at goal). Initial CBG here 89. IOL, favorable cervix, Pit started.  Gailen Shelter, MD OB Fellow 12/15/2016

## 2016-12-15 NOTE — Lactation Note (Signed)
This note was copied from a baby's chart. Lactation Consultation Note  Patient Name: Cassandra Vazquez [id number] Date: 12/15/2016 Reason for consult: Initial assessment Baby at 30 minutes of life. Upon entry baby was latched. Baby does come on and off the breast but mom is able to re latch baby easily. A few swallows were noted. Experienced Mom denies breast or nipple pain, voiced no concerns. Discussed baby behavior, feeding frequency, baby belly size, voids, wt loss, breast changes, and nipple care. Reviewed manual expression and spoon feeding. Given lactation handouts. Aware of OP services and support group.    Maternal Data Has patient been taught Hand Expression?: Yes Does the patient have breastfeeding experience prior to this delivery?: Yes  Feeding Feeding Type: Breast Fed Length of feed:  (latched at entry and exit)  LATCH Score Latch: Repeated attempts needed to sustain latch, nipple held in mouth throughout feeding, stimulation needed to elicit sucking reflex.  Audible Swallowing: A few with stimulation  Type of Nipple: Everted at rest and after stimulation  Comfort (Breast/Nipple): Soft / non-tender  Hold (Positioning): No assistance needed to correctly position infant at breast.  LATCH Score: 8  Interventions Interventions: Breast feeding basics reviewed;Hand express;Breast massage;Position options;Support pillows;Hand pump  Lactation Tools Discussed/Used WIC Program: No   Consult Status Consult Status: Follow-up Date: 12/16/16 Follow-up type: In-patient    Rulon Eisenmenger 12/15/2016, 1:03 PM

## 2016-12-16 LAB — GLUCOSE, CAPILLARY: Glucose-Capillary: 99 mg/dL (ref 65–99)

## 2016-12-16 LAB — RPR: RPR: NONREACTIVE

## 2016-12-16 MED ORDER — IBUPROFEN 600 MG PO TABS: 600.0000 mg | ORAL_TABLET | Freq: Four times a day (QID) | ORAL | 0 refills | Status: DC | PRN

## 2016-12-16 NOTE — Discharge Instructions (Signed)

## 2016-12-16 NOTE — Progress Notes (Signed)
Post Partum Day 1 Subjective: G3P3003 now PPD#1 from uncomplicated SVD following IOL for A2GDM.  She has had one mild range elevated BP this AM.  No headache, vision changes, epigastric or RUQ pain.  No concerns this AM.  Pain controlled.  Minimal lochia.  Ambulating with ease.  Tolerating PO.   Objective: Blood pressure 120/68, pulse 65, temperature 98 F (36.7 C), temperature source Axillary, resp. rate 18, height  (1.626 m), weight 122 kg (269 lb), last menstrual period 03/17/2016, SpO2 100 %, unknown if currently breastfeeding.  Physical Exam:  General: alert and cooperative Lochia: appropriate Uterine Fundus: firm Incision: N/A DVT Evaluation: No evidence of DVT seen on physical exam.   Recent Labs  12/15/16 0752  HGB 10.8*  HCT 32.7*    Assessment/Plan: G3P3003 now PPD#1 from SVD following IOL for A2GDM.  Continue routine postpartum care. A2GDM - never started her prescribed metformin; CBG this AM acceptable at 99; monitor Elevated BP - isolated; denies pre-eclampsia symptoms; monitor x48 hours   Anticipate discharge tomorrow.   LOS: 1 day   Cassandra Beach, DO PGY-2 Family Medicine Resident 12/16/2016, 8:23 AM   CNM attestation Post Partum Day #1 I have seen and examined this patient and agree with above documentation in the resident's note.   Cassandra Vazquez is a 31 y.o. [phone] s/p SVD.  Pt denies problems with ambulating, voiding or po intake. Pain is well controlled.  Plan for birth control is oral progesterone-only contraceptive.  Method of Feeding: breast  PE:  BP 120/68 (BP Location: Left Arm)   Pulse 65   Temp 98 F (36.7 C) (Axillary)   Resp 18   Ht  (1.626 m)   Wt 122 kg (269 lb)   LMP 03/17/2016   SpO2 100%   Breastfeeding? Unknown   BMI 46.17 kg/m  Fundus firm  Plan for discharge: 12/17/16  Cam Hai, CNM 10:06 AM 12/16/2016

## 2016-12-17 ENCOUNTER — Encounter (INDEPENDENT_AMBULATORY_CARE_PROVIDER_SITE_OTHER): Payer: Self-pay | Admitting: *Deleted

## 2016-12-17 DIAGNOSIS — Z029 Encounter for administrative examinations, unspecified: Secondary | ICD-10-CM

## 2016-12-17 NOTE — Lactation Note (Signed)
This note was copied from a baby's chart. Lactation Consultation Note: Mother reports that infant cluster fed through the night. Advised mother that this is normal for the first several nights. Advised mother to continue to cue base feed and feed at least 8-12 times in 24 hours. Mother advised in treatment and prevention of engorgement . Reviewed S/S of Mastitis. Mother informed of all breastfeeding services , BFSG'S and out patient dept. Mother receptive to all teaching. She has number to [facility] office for questions and concerns.   Patient Name: Cassandra Vazquez RUEAV'W Date: 12/17/2016 Reason for consult: Follow-up assessment   Maternal Data    Feeding Feeding Type: Breast Fed Length of feed: 10 min  LATCH Score Latch: Repeated attempts needed to sustain latch, nipple held in mouth throughout feeding, stimulation needed to elicit sucking reflex.  Audible Swallowing: None  Type of Nipple: Everted at rest and after stimulation  Comfort (Breast/Nipple): Soft / non-tender  Hold (Positioning): No assistance needed to correctly position infant at breast.  LATCH Score: 7  Interventions    Lactation Tools Discussed/Used     Consult Status Consult Status: Complete    Michel Bickers 12/17/2016, 9:51 AM

## 2016-12-17 NOTE — Discharge Summary (Signed)
OB Discharge Summary     Patient Name: Cassandra Vazquez DOB: February 15, 1986 MRN: [id number]  Date of admission: 12/15/2016 Delivering MD: KEY, Stanton Kidney K   Date of discharge: 12/17/2016  Admitting diagnosis: INDUCTION Intrauterine pregnancy: [redacted]w[redacted]d    Secondary diagnosis:  Principal Problem:   NSVD (normal spontaneous vaginal delivery) Active Problems:   Obesity in pregnancy, antepartum, third trimester   GDM, class A2  Additional problems:  Patient Active Problem List   Diagnosis Date Noted  . NSVD (normal spontaneous vaginal delivery) 12/15/2016  . PPD screening test 11/08/2016  . GDM, class A2 10/22/2016  . Supervision of high-risk pregnancy 10/21/2016  . Obesity in pregnancy, antepartum, third trimester 10/21/2016  . BMI 45.0-49.9, adult ([place] 10/21/2016        Discharge diagnosis: Term Pregnancy Delivered                                                                                                Post partum procedures: None  Augmentation: Pitocin  Complications: None  Hospital course:  Induction of Labor With Vaginal Delivery   31y.o. yo G3P3003 at 3103w0das admitted to the hospital 12/15/2016 for induction of labor.  Indication for induction: A1 DM.  Patient had an uncomplicated labor course as follows: Membrane Rupture Time/Date: 12:12 PM ,12/15/2016   Intrapartum Procedures: Episiotomy: None [1]                                         Lacerations:  None [1]  Patient had delivery of a Viable infant.  Information for the patient's newborn:  WeRoselani, Grajeda0[[id number]]Delivery Method: Vaginal, Spontaneous Delivery (Filed from Delivery Summary)   12/15/2016  Details of delivery can be found in separate delivery note.  Patient had a routine postpartum course.  She did have two mildly elevated blood pressures on postpartum day one but these resolved.  She had no headache, vision changes, epigastric or RUQ pain.  On day of discharge, her blood pressure is normal.   Patient  is discharged home 12/17/16.  Physical exam  Vitals:   12/15/16 1915 12/16/16 0300 12/16/16 1807 12/17/16 0621  BP: 138/80 120/68 (!) 115/59 127/72  Pulse: 100 65 90 89  Resp: '16 18 18 18  ' Temp: 99.6 F (37.6 C) 98 F (36.7 C) 98.4 F (36.9 C) 98.2 F (36.8 C)  TempSrc: Axillary Axillary Oral Oral  SpO2:      Weight:      Height:       General: alert, cooperative and no distress Lochia: appropriate Uterine Fundus: firm Incision: N/A DVT Evaluation: No evidence of DVT seen on physical exam. Labs: Lab Results  Component Value Date   WBC 10.5 12/15/2016   HGB 10.8 (L) 12/15/2016   HCT 32.7 (L) 12/15/2016   MCV 76.9 (L) 12/15/2016   PLT 343 12/15/2016   CMP Latest Ref Rng & Units 11/30/2016  Glucose 65 - 99 mg/dL 51(L)  BUN 6 - 20 mg/dL 10  Creatinine  0.57 - 1.00 mg/dL 0.49(L)  Sodium 134 - 144 mmol/L 137  Potassium 3.5 - 5.2 mmol/L 4.2  Chloride 96 - 106 mmol/L 102  CO2 20 - 29 mmol/L 20  Calcium 8.7 - 10.2 mg/dL 9.0    Discharge instruction: per After Visit Summary and "Baby and Me Booklet".  After visit meds:  Allergies as of 12/17/2016   No Known Allergies     Medication List    STOP taking these medications   ACCU-CHEK FASTCLIX LANCETS Misc   ACCU-CHEK NANO SMARTVIEW w/Device Kit   glucose blood test strip     TAKE these medications   ibuprofen 600 MG tablet Commonly known as:  ADVIL,MOTRIN Take 1 tablet (600 mg total) by mouth every 6 (six) hours as needed for moderate pain or cramping.            Discharge Care Instructions        Start     Ordered   12/16/16 0000  ibuprofen (ADVIL,MOTRIN) 600 MG tablet  Every 6 hours PRN     12/16/16 0801   12/15/16 0000  OB RESULT CONSOLE Group B Strep    Comments:  This external order was created through the Results Console.   12/15/16 0726      Diet: carb modified diet  Activity: Advance as tolerated. Pelvic rest for 6 weeks.   Outpatient follow up:6 weeks Follow up Appt:Future  Appointments Date Time Provider [place]  01/27/2017 10:00 AM Anyanwu, Sallyanne Havers, MD [facility]   Follow up Visit:No Follow-up on file.  Postpartum contraception: Progesterone only pills  Newborn Data: Live born female  Birth Weight: 6 lb 7.2 oz (2926 g) APGAR: 8, 9  Baby Feeding: Breast Disposition:home with mother   12/17/2016 Metta Clines, DO PGY-2 Family Medicine Resident  I have seen and examined this patient and agree with the management plan.

## 2016-12-21 ENCOUNTER — Encounter: Payer: BLUE CROSS/BLUE SHIELD | Admitting: Obstetrics & Gynecology

## 2017-01-27 ENCOUNTER — Ambulatory Visit (INDEPENDENT_AMBULATORY_CARE_PROVIDER_SITE_OTHER): Payer: BC Managed Care – PPO | Admitting: Clinical

## 2017-01-27 ENCOUNTER — Encounter: Payer: Self-pay | Admitting: Obstetrics & Gynecology

## 2017-01-27 ENCOUNTER — Ambulatory Visit (INDEPENDENT_AMBULATORY_CARE_PROVIDER_SITE_OTHER): Payer: BC Managed Care – PPO | Admitting: Obstetrics & Gynecology

## 2017-01-27 VITALS — BP 100/61 | HR 75 | Wt 255.2 lb

## 2017-01-27 DIAGNOSIS — F53 Postpartum depression: Secondary | ICD-10-CM | POA: Insufficient documentation

## 2017-01-27 DIAGNOSIS — F4323 Adjustment disorder with mixed anxiety and depressed mood: Secondary | ICD-10-CM | POA: Diagnosis not present

## 2017-01-27 DIAGNOSIS — Z8632 Personal history of gestational diabetes: Secondary | ICD-10-CM

## 2017-01-27 DIAGNOSIS — Z1389 Encounter for screening for other disorder: Secondary | ICD-10-CM | POA: Diagnosis not present

## 2017-01-27 DIAGNOSIS — O99345 Other mental disorders complicating the puerperium: Secondary | ICD-10-CM

## 2017-01-27 NOTE — BH Specialist Note (Addendum)
Integrated Behavioral Health Initial Visit  MRN: [id number] Name: Cassandra Vazquez  Number of Integrated Behavioral Health Clinician visits:: 1/6 Session Start time: 3:30  Session End time: 4:30 Total time: 1 hour  Type of Service: Integrated Behavioral Health- Individual/Family Interpretor:No. Interpretor Name and Language: n/a   Warm Hand Off Completed.       SUBJECTIVE: Cassandra Vazquez is a 31 y.o. female accompanied by n/a Patient was referred by Dr Macon LargeAnyanwu for depression. Patient reports the following symptoms/concerns: Pt states her primary concern today is feeling depressed, fatigue, anxious, irritable, lack of concentration and poor quality of sleep postpartum; feels best when she is able to plan ahead. Pt sleeps in about two hour stretches at a time. 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 Reduce symptoms of: anxiety and depression 2. Increase knowledge and/or ability of: coping skills  3. Demonstrate ability to: Increase healthy adjustment to current life circumstances  INTERVENTIONS: Interventions utilized: Copywriter, advertisingMindfulness or Relaxation Training, Behavioral Activation and Psychoeducation and/or Health Education  Standardized Assessments completed: Not Needed; did Edinburgh screening at previous medical visit today, with score of 23, no SI  ASSESSMENT: Patient currently experiencing Adjustment disorder with mixed anxious and depressed mood.   Patient may benefit from psychoeducation and brief therapeutic interventions  regarding coping with symptoms of anxiety and depression.  PLAN: 1. Follow up with behavioral health clinician on : One month, or earlier, as needed, if symptoms increase and/or do not improve 2. Behavioral recommendations:  -Begin using sleep mask, aromatherapy, and sound machine at bedtime, starting tonight -CALM relaxation breathing exercise every morning; throughout the day as needed -On first day back at work, consider teaching students CALM and/or flower/pinwheel breathing exercise, for improved work atmosphere -Begin Worry Hour strategy tomorrow, to prioritize worries -Consider apps for additional self-coping -Read educational material regarding coping with symptoms of anxiety and depression, along with information about how antidepressants work -If symptoms do not improve within two weeks, [facility] medical provider [facility] 3. Referral(s): [facility] 4. "From scale of 1-10, how likely are you to follow plan?": 9  Georg Ang C Lovenia Debruler, LCSWA

## 2017-01-27 NOTE — Patient Instructions (Signed)
Return to clinic for any scheduled appointments or for any gynecologic concerns as needed.   

## 2017-01-27 NOTE — Progress Notes (Signed)
Post Partum Exam  Cassandra Vazquez is a 31 y.o. 1033P3003 female who presents for a postpartum visit. She is 6 weeks postpartum following a spontaneous vaginal delivery. I have fully reviewed the prenatal and intrapartum course; had A2GDM. The delivery was at 39.0 gestational weeks.  Anesthesia: epidural. Postpartum course has been unremarkable. Baby's course has been uncomplicated. Baby is feeding by breast. Bleeding not at this time. Bowel function is normal. Bladder function is normal. Patient is not sexually active. Contraception method is condoms. Postpartum depression screening:positive (see below); denies any SI or HI.  Feels she has adequate support at home.  The following portions of the patient's history were reviewed and updated as appropriate: allergies, current medications, past family history, past medical history, past social history, past surgical history and problem list. Normal pap in 2/04/29/2016.  Review of Systems Pertinent items noted in HPI and remainder of comprehensive ROS otherwise negative.    Objective:  Blood pressure 100/61, pulse 75, weight 255 lb 3.2 oz (115.8 kg), unknown if currently breastfeeding.  General:  alert   Breasts:  inspection negative, no nipple discharge or bleeding, no masses or nodularity palpable  Lungs: clear to auscultation bilaterally  Heart:  regular rate and rhythm  Abdomen: soft, non-tender; bowel sounds normal; no masses,  no organomegaly   Pelvic:  not evaluated          Assessment:   Normal postpartum exam. Has postpartum depression  Plan:  1. History of gestational diabetes Will return for postpartum 2 hr GTT tomorrow, will follow up results and manage accordingly.  2. Postpartum depression Patient [facility]ally [facility] concerns.  [facility] called, appointment scheduled for 3:30pm today. - Amb ref to Integrated Behavioral Health  3. Postpartum care following vaginal  delivery Contraception: condoms. Declines other methods.  Follow up as scheduled and later on as needed.  Jaynie CollinsUGONNA  Cassandra Jares, MD, FACOG Attending Obstetrician & Gynecologist, [facility] for [facility], [facility]

## 2017-01-28 ENCOUNTER — Other Ambulatory Visit: Payer: BC Managed Care – PPO

## 2017-01-28 DIAGNOSIS — O24419 Gestational diabetes mellitus in pregnancy, unspecified control: Secondary | ICD-10-CM

## 2017-01-31 LAB — GLUCOSE TOLERANCE, 2 HOURS
GLUCOSE FASTING GTT: 87 mg/dL (ref 65–99)
GLUCOSE, 2 HOUR: 135 mg/dL (ref 65–139)

## 2017-03-20 ENCOUNTER — Encounter: Payer: Self-pay | Admitting: Emergency Medicine

## 2017-03-20 ENCOUNTER — Emergency Department
Admission: EM | Admit: 2017-03-20 | Discharge: 2017-03-20 | Disposition: A | Discharge status: Home / Self Care | Payer: BC Managed Care – PPO | Attending: Emergency Medicine | Admitting: Emergency Medicine

## 2017-03-20 ENCOUNTER — Other Ambulatory Visit: Payer: Self-pay

## 2017-03-20 ENCOUNTER — Emergency Department: Payer: BC Managed Care – PPO

## 2017-03-20 DIAGNOSIS — S93401A Sprain of unspecified ligament of right ankle, initial encounter: Secondary | ICD-10-CM | POA: Insufficient documentation

## 2017-03-20 DIAGNOSIS — Y939 Activity, unspecified: Secondary | ICD-10-CM | POA: Insufficient documentation

## 2017-03-20 DIAGNOSIS — W010XXA Fall on same level from slipping, tripping and stumbling without subsequent striking against object, initial encounter: Secondary | ICD-10-CM | POA: Diagnosis not present

## 2017-03-20 DIAGNOSIS — S99911A Unspecified injury of right ankle, initial encounter: Secondary | ICD-10-CM | POA: Diagnosis present

## 2017-03-20 DIAGNOSIS — Y929 Unspecified place or not applicable: Secondary | ICD-10-CM | POA: Insufficient documentation

## 2017-03-20 DIAGNOSIS — Y999 Unspecified external cause status: Secondary | ICD-10-CM | POA: Insufficient documentation

## 2017-03-20 MED ORDER — NAPROXEN 500 MG PO TABS
500.0000 mg | ORAL_TABLET | Freq: Once | ORAL | Status: AC
  Administered 2017-03-20: 500 mg via ORAL
  Filled 2017-03-20: qty 1

## 2017-03-20 NOTE — ED Triage Notes (Signed)
States she fell twisted right ankle   Unable to bear wt

## 2017-03-20 NOTE — Discharge Instructions (Signed)
Follow up with the podiatrist if not improving over the week.  Return to the ER for symptoms that change or worsen if unable to schedule an appointment.

## 2017-03-20 NOTE — ED Provider Notes (Signed)
[facility] Emergency Department Provider Note ____________________________________________  Time seen: Approximately 12:43 PM  I have reviewed the triage vital signs and the nursing notes.   HISTORY  Chief Complaint Ankle Pain    HPI Cassandra Vazquez is a 31 y.o. female who presents to the emergency department for treatment and evaluation of right ankle pain.  She reports and eversion injury earlier today.  She reports significant pain with attempts to bear weight.  She denies other pain or injury.  She has not taken any medications for pain or attempted any other alleviating measures prior to arrival. Past Medical History:  Diagnosis Date  . History of A2 gestational diabetes 10/22/2016   Dx 7/26    Patient Active Problem List   Diagnosis Date Noted  . Postpartum depression 01/27/2017  . PPD screening test 11/08/2016  . History of A2 gestational diabetes 10/22/2016  . Supervision of high-risk pregnancy 10/21/2016  . Obesity in pregnancy, antepartum, third trimester 10/21/2016  . BMI 45.0-49.9, adult ([facility]) 10/21/2016    History reviewed. No pertinent surgical history.  Prior to Admission medications   Medication Sig Start Date End Date Taking? Authorizing Provider  ibuprofen (ADVIL,MOTRIN) 600 MG tablet Take 1 tablet (600 mg total) by mouth every 6 (six) hours as needed for moderate pain or cramping. Patient not taking: Reported on 01/27/2017 12/16/16   Larene BeachKey, Mary K, DO    Allergies Patient has no known allergies.  Family History  Problem Relation Age of Onset  . Diabetes Mother   . Diabetes Father   . CVA Father     Social History Social History   Tobacco Use  . Smoking status: Never Smoker  . Smokeless tobacco: Never Used  Substance Use Topics  . Alcohol use: No  . Drug use: No    Review of Systems Constitutional: Negative for recent illness. Cardiovascular: Negative for chest pain. Respiratory: Negative for shortness of  breath. Musculoskeletal: Positive for right ankle pain. Skin: Positive for diffuse swelling without erythema of the right ankle. Neurological: Negative for paresthesias.  ____________________________________________   PHYSICAL EXAM:  VITAL SIGNS: ED Triage Vitals  Enc Vitals Group     BP 03/20/17 1158 130/87     Pulse Rate 03/20/17 1157 94     Resp 03/20/17 1157 18     Temp 03/20/17 1157 98.2 F (36.8 C)     Temp Source 03/20/17 1157 Oral     SpO2 03/20/17 1157 98 %     Weight --      Height --      Head Circumference --      Peak Flow --      Pain Score 03/20/17 1150 8     Pain Loc --      Pain Edu? --      Excl. in GC? --     Constitutional: Alert and oriented. Well appearing and in no acute distress. Eyes: Conjunctivae are clear without discharge or drainage Head: Atraumatic Neck: Supple. Respiratory: Respirations even and unlabored. Musculoskeletal: Diffuse soft tissue swelling over the right ankle is noted on exam.  Compartments are soft.  Patient has limited flexion and extension of the right foot secondary to pain.  There is no obvious deformity.  There is no open wounds or lesions about the ankle. Neurologic: Motor and sensation is intact over the right lower Skin: Intact Psychiatric: Affect and behavior appropriate.  ____________________________________________   LABS (all labs ordered are listed, but only abnormal results are displayed)  Labs  Reviewed - No data to display ____________________________________________  RADIOLOGY  X-ray of the right ankle is negative for acute bony abnormality per radiology. ____________________________________________   PROCEDURES  .Splint Application Date/Time: 03/20/2017 1:46 PM Performed by: Chinita Pesterriplett, Laiyah Exline B, FNP Authorized by: Chinita Pesterriplett, Kemberly Taves B, FNP   Consent:    Consent obtained:  Verbal   Consent given by:  Patient Pre-procedure details:    Sensation:  Normal Procedure details:    Laterality:  Right    Location:  Ankle   Splint type:  Ankle stirrup   Supplies:  Prefabricated splint Post-procedure details:    Pain:  Improved   Sensation:  Normal   Patient tolerance of procedure:  Tolerated well, no immediate complications    ____________________________________________   INITIAL IMPRESSION / ASSESSMENT AND PLAN / ED COURSE  Cassandra Vazquez is a 31 y.o. female who presents to the emergency department after sustaining a mechanical, non-syncopal fall and twisting her right ankle as a result.  X-ray and clinical exam are consistent.  Patient will be placed in a Velcro ankle stirrup splint and advised to rest, ice, and elevate her right lower extremity.  She was instructed to follow-up with podiatry if not improving over a week.  She was instructed to return to the emergency department for symptoms that change or worsen if she is unable to schedule an appointment.  Medications  naproxen (NAPROSYN) tablet 500 mg (not administered)    Pertinent labs & imaging results that were available during my care of the patient were reviewed by me and considered in my medical decision making (see chart for details).  _________________________________________   FINAL CLINICAL IMPRESSION(S) / ED DIAGNOSES  Final diagnoses:  Sprain of right ankle, unspecified ligament, initial encounter    ED Discharge Orders    None       If controlled substance prescribed during this visit, 12 month history viewed on the N[facility] prior to issuing an initial prescription for Schedule II or III opiod.    Chinita Pesterriplett, Isam Unrein B, FNP 03/20/17 1348    Arnaldo NatalMalinda, Paul F, MD 03/20/17 [phone]

## 2017-09-02 ENCOUNTER — Encounter: Payer: Self-pay | Admitting: Family

## 2017-09-02 ENCOUNTER — Ambulatory Visit (INDEPENDENT_AMBULATORY_CARE_PROVIDER_SITE_OTHER): Payer: BC Managed Care – PPO | Admitting: Family

## 2017-09-02 VITALS — BP 110/74 | HR 84 | Temp 98.4°F | Ht 64.0 in | Wt 272.0 lb

## 2017-09-02 DIAGNOSIS — O99213 Obesity complicating pregnancy, third trimester: Secondary | ICD-10-CM | POA: Diagnosis not present

## 2017-09-02 DIAGNOSIS — F339 Major depressive disorder, recurrent, unspecified: Secondary | ICD-10-CM | POA: Diagnosis not present

## 2017-09-02 DIAGNOSIS — Z6841 Body Mass Index (BMI) 40.0 and over, adult: Secondary | ICD-10-CM

## 2017-09-02 DIAGNOSIS — Z0000 Encounter for general adult medical examination without abnormal findings: Secondary | ICD-10-CM

## 2017-09-02 DIAGNOSIS — R5383 Other fatigue: Secondary | ICD-10-CM | POA: Diagnosis not present

## 2017-09-02 MED ORDER — BUPROPION HCL ER (XL) 150 MG PO TB24: ORAL_TABLET | ORAL | 3 refills | Status: DC

## 2017-09-02 NOTE — Assessment & Plan Note (Signed)
Suspect multifactorial as discussed with patient. Lack of exercise, disrupted sleep, obesity likely contributing factors.  Pending lab work for metabolic reasons as well.  Very much encouraged exercise, weight loss.

## 2017-09-02 NOTE — Assessment & Plan Note (Signed)
Trial of Wellbutrin.  Referral for medical weight loss management. will follow

## 2017-09-02 NOTE — Progress Notes (Signed)
Subjective:    Patient ID: Cassandra Vazquez, female    DOB: [birth year], 32 y.o.   MRN: [id number]  CC: Cassandra Vazquez is a [age] y.o. female who presents today for physical exam.    HPI: Feeling well. No concerns.   Does report low energy and has gained 11 pounds. No changes to diet. No exercise. Has 738 month old daughter. Disrupted sleep with 3 kids.  Doesn't snore.   Reports depression since baby . Feels overwhelmed. No si/hi. No h/o seizure. Drinks one glass of wine per night     Colorectal Cancer Screening: no early family history Breast Cancer Screening: no early family history Cervical Cancer Screening: UTD 2018 , normal.  Bone Health screening/DEXA for 65+: No increased fracture risk. Defer screening at this time. Lung Cancer Screening: Doesn't have 30 year pack year history and age > 55 years. Immunizations       Tetanus - utd        Labs: Screening labs today. Exercise: Gets regular exercise.  Alcohol use:  Nightly, one glass of wine Smoking/tobacco use: Nonsmoker.  Regular dental exams: In need of dental exam. Wears seat belt: Yes. Skin: no new lesions; no h/o skin cancer  HISTORY:  Past Medical History:  Diagnosis Date  . History of A2 gestational diabetes 10/22/2016   Dx 7/26    History reviewed. No pertinent surgical history. Family History  Problem Relation Age of Onset  . Diabetes Mother   . Diabetes Father   . CVA Father   . Colon cancer Neg Hx   . Breast cancer Neg Hx       ALLERGIES: Patient has no known allergies.  Current Outpatient Medications on File Prior to Visit  Medication Sig Dispense Refill  . ibuprofen (ADVIL,MOTRIN) 600 MG tablet Take 1 tablet (600 mg total) by mouth every 6 (six) hours as needed for moderate pain or cramping. (Patient not taking: Reported on 01/27/2017) 30 tablet 0   No current facility-administered medications on file prior to visit.     Social History   Tobacco Use  . Smoking status: Never Smoker  . Smokeless  tobacco: Never Used  Substance Use Topics  . Alcohol use: Yes    Comment: wine one glass per night  . Drug use: No    Review of Systems  Constitutional: Positive for fatigue. Negative for chills, fever and unexpected weight change.  HENT: Negative for congestion.   Respiratory: Negative for cough.   Cardiovascular: Negative for chest pain, palpitations and leg swelling.  Gastrointestinal: Negative for nausea and vomiting.  Musculoskeletal: Negative for arthralgias and myalgias.  Skin: Negative for rash.  Neurological: Negative for headaches.  Hematological: Negative for adenopathy.  Psychiatric/Behavioral: Negative for confusion, sleep disturbance and suicidal ideas.      Objective:    BP 110/74 (BP Location: Left Arm, Patient Position: Sitting, Cuff Size: Normal)   Pulse 84   Temp 98.4 F (36.9 C) (Oral)   Ht 5\' 4"  (1.626 m)   Wt 272 lb (123.4 kg)   SpO2 99%   BMI 46.69 kg/m   BP Readings from Last 3 Encounters:  09/02/17 110/74  03/20/17 130/87  01/27/17 100/61   Wt Readings from Last 3 Encounters:  09/02/17 272 lb (123.4 kg)  01/27/17 255 lb 3.2 oz (115.8 kg)  12/15/16 269 lb (122 kg)    Physical Exam  Constitutional: She appears well-developed and well-nourished.  Eyes: Conjunctivae are normal.  Neck: No thyroid mass and no thyromegaly present.  Cardiovascular: Normal rate, regular rhythm, normal heart sounds and normal pulses.  Pulmonary/Chest: Effort normal and breath sounds normal. She has no wheezes. She has no rhonchi. She has no rales. Right breast exhibits no inverted nipple, no mass, no nipple discharge, no skin change and no tenderness. Left breast exhibits no inverted nipple, no mass, no nipple discharge, no skin change and no tenderness. Breasts are symmetrical.  CBE performed.   Lymphadenopathy:       Head (right side): No submental, no submandibular, no tonsillar, no preauricular, no posterior auricular and no occipital adenopathy present.        Head (left side): No submental, no submandibular, no tonsillar, no preauricular, no posterior auricular and no occipital adenopathy present.    She has no cervical adenopathy.       Right cervical: No superficial cervical, no deep cervical and no posterior cervical adenopathy present.      Left cervical: No superficial cervical, no deep cervical and no posterior cervical adenopathy present.    She has no axillary adenopathy.  Neurological: She is alert.  Skin: Skin is warm and dry.  Psychiatric: She has a normal mood and affect. Her speech is normal and behavior is normal. Thought content normal.  Vitals reviewed.      Assessment & Plan:   Problem List Items Addressed This Visit      Other   BMI 45.0-49.9, adult (HCC)    Trial of Wellbutrin.  Referral for medical weight loss management. will follow      Routine physical examination - Primary    Clinical breast exam performed.  Patient declines pelvic exam in the absence of complaints today, Pap is up-to-date.      Relevant Orders   Comprehensive metabolic panel   Depression, recurrent (HCC)    Unchanged.  Will try Wellbutrin, added benefit of weight loss.  Patient to follow-up in 6 weeks.      Relevant Medications   buPROPion (WELLBUTRIN XL) 150 MG 24 hr tablet   Fatigue    Suspect multifactorial as discussed with patient. Lack of exercise, disrupted sleep, obesity likely contributing factors.  Pending lab work for metabolic reasons as well.  Very much encouraged exercise, weight loss.      Relevant Orders   CBC with Differential/Platelet   TSH   VITAMIN D 25 Hydroxy (Vit-D Deficiency, Fractures)   RESOLVED: Obesity in pregnancy, antepartum, third trimester   Relevant Orders   Amb Ref to Medical Weight Management   Hemoglobin A1c   Lipid panel       I am having Cassandra Vazquez start on buPROPion. I am also having her maintain her ibuprofen.   Meds ordered this encounter  Medications  . buPROPion (WELLBUTRIN XL) 150  MG 24 hr tablet    Sig: Start 150 mg ER PO qam, increase after 3 days to 300mg  qam    Dispense:  60 tablet    Refill:  3    Order Specific Question:   Supervising Provider    Answer:   Sherlene Shams [[id number]]    Return precautions given.   Risks, benefits, and alternatives of the medications and treatment plan prescribed today were discussed, and patient expressed understanding.   Education regarding symptom management and diagnosis given to patient on AVS.   Continue to follow with Allegra Grana, FNP for routine health maintenance.   Cassandra Vazquez and I agreed with plan.   Rennie Plowman, FNP

## 2017-09-02 NOTE — Assessment & Plan Note (Signed)
Unchanged.  Will try Wellbutrin, added benefit of weight loss.  Patient to follow-up in 6 weeks.

## 2017-09-02 NOTE — Assessment & Plan Note (Signed)
Clinical breast exam performed.  Patient declines pelvic exam in the absence of complaints today, Pap is up-to-date.

## 2017-09-02 NOTE — Patient Instructions (Addendum)
Trial of wellbutrin. Limit red wine.   Fasting labs when you can  Today we discussed referrals, orders. Cassandra Vazquez - medical weight loss physican.    I have placed these orders in the system for you.  Please be sure to give [place] a call if you have not heard from our office regarding scheduling a test or regarding referral in a timely manner.  It is very important that you let me know as soon as possible.    Health Maintenance, Female Adopting a healthy lifestyle and getting preventive care can go a long way to promote health and wellness. Talk with your health care provider about what schedule of regular examinations is right for you. This is a good chance for you to check in with your provider about disease prevention and staying healthy. In between checkups, there are plenty of things you can do on your own. Experts have done a lot of research about which lifestyle changes and preventive measures are most likely to keep you healthy. Ask your health care provider for more information. Weight and diet Eat a healthy diet  Be sure to include plenty of vegetables, fruits, low-fat dairy products, and lean protein.  Do not eat a lot of foods high in solid fats, added sugars, or salt.  Get regular exercise. This is one of the most important things you can do for your health. ? Most adults should exercise for at least 150 minutes each week. The exercise should increase your heart rate and make you sweat (moderate-intensity exercise). ? Most adults should also do strengthening exercises at least twice a week. This is in addition to the moderate-intensity exercise.  Maintain a healthy weight  Body mass index (BMI) is a measurement that can be used to identify possible weight problems. It estimates body fat based on height and weight. Your health care provider can help determine your BMI and help you achieve or maintain a healthy weight.  For females 27 years of age and older: ? A BMI below 18.5 is  considered underweight. ? A BMI of 18.5 to 24.9 is normal. ? A BMI of 25 to 29.9 is considered overweight. ? A BMI of 30 and above is considered obese.  Watch levels of cholesterol and blood lipids  You should start having your blood tested for lipids and cholesterol at 30 years of age, then have this test every 5 years.  You may need to have your cholesterol levels checked more often if: ? Your lipid or cholesterol levels are high. ? You are older than 30 years of age. ? You are at high risk for heart disease.  Cancer screening Lung Cancer  Lung cancer screening is recommended for adults 12-60 years old who are at high risk for lung cancer because of a history of smoking.  A yearly low-dose CT scan of the lungs is recommended for people who: ? Currently smoke. ? Have quit within the past 15 years. ? Have at least a 30-pack-year history of smoking. A pack year is smoking an average of one pack of cigarettes a day for 1 year.  Yearly screening should continue until it has been 15 years since you quit.  Yearly screening should stop if you develop a health problem that would prevent you from having lung cancer treatment.  Breast Cancer  Practice breast self-awareness. This means understanding how your breasts normally appear and feel.  It also means doing regular breast self-exams. Let your health care provider know about any  changes, no matter how small.  If you are in your 20s or 30s, you should have a clinical breast exam (CBE) by a health care provider every 1-3 years as part of a regular health exam.  If you are 71 or older, have a CBE every year. Also consider having a breast X-ray (mammogram) every year.  If you have a family history of breast cancer, talk to your health care provider about genetic screening.  If you are at high risk for breast cancer, talk to your health care provider about having an MRI and a mammogram every year.  Breast cancer gene (BRCA) assessment  is recommended for women who have family members with BRCA-related cancers. BRCA-related cancers include: ? Breast. ? Ovarian. ? Tubal. ? Peritoneal cancers.  Results of the assessment will determine the need for genetic counseling and BRCA1 and BRCA2 testing.  Cervical Cancer Your health care provider may recommend that you be screened regularly for cancer of the pelvic organs (ovaries, uterus, and vagina). This screening involves a pelvic examination, including checking for microscopic changes to the surface of your cervix (Pap test). You may be encouraged to have this screening done every 3 years, beginning at age 102.  For women ages 11-65, health care providers may recommend pelvic exams and Pap testing every 3 years, or they may recommend the Pap and pelvic exam, combined with testing for human papilloma virus (HPV), every 5 years. Some types of HPV increase your risk of cervical cancer. Testing for HPV may also be done on women of any age with unclear Pap test results.  Other health care providers may not recommend any screening for nonpregnant women who are considered low risk for pelvic cancer and who do not have symptoms. Ask your health care provider if a screening pelvic exam is right for you.  If you have had past treatment for cervical cancer or a condition that could lead to cancer, you need Pap tests and screening for cancer for at least 20 years after your treatment. If Pap tests have been discontinued, your risk factors (such as having a new sexual partner) need to be reassessed to determine if screening should resume. Some women have medical problems that increase the chance of getting cervical cancer. In these cases, your health care provider may recommend more frequent screening and Pap tests.  Colorectal Cancer  This type of cancer can be detected and often prevented.  Routine colorectal cancer screening usually begins at 30 years of age and continues through 30 years of  age.  Your health care provider may recommend screening at an earlier age if you have risk factors for colon cancer.  Your health care provider may also recommend using home test kits to check for hidden blood in the stool.  A small camera at the end of a tube can be used to examine your colon directly (sigmoidoscopy or colonoscopy). This is done to check for the earliest forms of colorectal cancer.  Routine screening usually begins at age 16.  Direct examination of the colon should be repeated every 5-10 years through 30 years of age. However, you may need to be screened more often if early forms of precancerous polyps or small growths are found.  Skin Cancer  Check your skin from head to toe regularly.  Tell your health care provider about any new moles or changes in moles, especially if there is a change in a mole's shape or color.  Also tell your health care provider if  you have a mole that is larger than the size of a pencil eraser.  Always use sunscreen. Apply sunscreen liberally and repeatedly throughout the day.  Protect yourself by wearing long sleeves, pants, a wide-brimmed hat, and sunglasses whenever you are outside.  Heart disease, diabetes, and high blood pressure  High blood pressure causes heart disease and increases the risk of stroke. High blood pressure is more likely to develop in: ? People who have blood pressure in the high end of the normal range (130-139/85-89 mm Hg). ? People who are overweight or obese. ? People who are African American.  If you are 34-38 years of age, have your blood pressure checked every 3-5 years. If you are 9 years of age or older, have your blood pressure checked every year. You should have your blood pressure measured twice-once when you are at a hospital or clinic, and once when you are not at a hospital or clinic. Record the average of the two measurements. To check your blood pressure when you are not at a hospital or clinic, you  can use: ? An automated blood pressure machine at a pharmacy. ? A home blood pressure monitor.  If you are between 3 years and 71 years old, ask your health care provider if you should take aspirin to prevent strokes.  Have regular diabetes screenings. This involves taking a blood sample to check your fasting blood sugar level. ? If you are at a normal weight and have a low risk for diabetes, have this test once every three years after 30 years of age. ? If you are overweight and have a high risk for diabetes, consider being tested at a younger age or more often. Preventing infection Hepatitis B  If you have a higher risk for hepatitis B, you should be screened for this virus. You are considered at high risk for hepatitis B if: ? You were born in a country where hepatitis B is common. Ask your health care provider which countries are considered high risk. ? Your parents were born in a high-risk country, and you have not been immunized against hepatitis B (hepatitis B vaccine). ? You have HIV or AIDS. ? You use needles to inject street drugs. ? You live with someone who has hepatitis B. ? You have had sex with someone who has hepatitis B. ? You get hemodialysis treatment. ? You take certain medicines for conditions, including cancer, organ transplantation, and autoimmune conditions.  Hepatitis C  Blood testing is recommended for: ? Everyone born from [birth year] through 1963. ? Anyone with known risk factors for hepatitis C.  Sexually transmitted infections (STIs)  You should be screened for sexually transmitted infections (STIs) including gonorrhea and chlamydia if: ? You are sexually active and are younger than 30 years of age. ? You are older than 30 years of age and your health care provider tells you that you are at risk for this type of infection. ? Your sexual activity has changed since you were last screened and you are at an increased risk for chlamydia or gonorrhea. Ask your  health care provider if you are at risk.  If you do not have HIV, but are at risk, it may be recommended that you take a prescription medicine daily to prevent HIV infection. This is called pre-exposure prophylaxis (PrEP). You are considered at risk if: ? You are sexually active and do not regularly use condoms or know the HIV status of your partner(s). ? You take drugs by  injection. ? You are sexually active with a partner who has HIV.  Talk with your health care provider about whether you are at high risk of being infected with HIV. If you choose to begin PrEP, you should first be tested for HIV. You should then be tested every 3 months for as long as you are taking PrEP. Pregnancy  If you are premenopausal and you may become pregnant, ask your health care provider about preconception counseling.  If you may become pregnant, take 400 to 800 micrograms (mcg) of folic acid every day.  If you want to prevent pregnancy, talk to your health care provider about birth control (contraception). Osteoporosis and menopause  Osteoporosis is a disease in which the bones lose minerals and strength with aging. This can result in serious bone fractures. Your risk for osteoporosis can be identified using a bone density scan.  If you are 45 years of age or older, or if you are at risk for osteoporosis and fractures, ask your health care provider if you should be screened.  Ask your health care provider whether you should take a calcium or vitamin D supplement to lower your risk for osteoporosis.  Menopause may have certain physical symptoms and risks.  Hormone replacement therapy may reduce some of these symptoms and risks. Talk to your health care provider about whether hormone replacement therapy is right for you. Follow these instructions at home:  Schedule regular health, dental, and eye exams.  Stay current with your immunizations.  Do not use any tobacco products including cigarettes, chewing  tobacco, or electronic cigarettes.  If you are pregnant, do not drink alcohol.  If you are breastfeeding, limit how much and how often you drink alcohol.  Limit alcohol intake to no more than 1 drink per day for nonpregnant women. One drink equals 12 ounces of beer, 5 ounces of wine, or 1 ounces of hard liquor.  Do not use street drugs.  Do not share needles.  Ask your health care provider for help if you need support or information about quitting drugs.  Tell your health care provider if you often feel depressed.  Tell your health care provider if you have ever been abused or do not feel safe at home. This information is not intended to replace advice given to you by your health care provider. Make sure you discuss any questions you have with your health care provider. Document Released: 09/28/2010 Document Revised: 08/21/2015 Document Reviewed: 12/17/2014 Elsevier Interactive Patient Education  [employer].

## 2017-09-08 ENCOUNTER — Other Ambulatory Visit: Payer: BC Managed Care – PPO

## 2017-10-08 ENCOUNTER — Other Ambulatory Visit: Payer: Self-pay

## 2017-10-08 ENCOUNTER — Emergency Department: Payer: BC Managed Care – PPO

## 2017-10-08 ENCOUNTER — Emergency Department
Admission: EM | Admit: 2017-10-08 | Discharge: 2017-10-08 | Disposition: A | Discharge status: Home / Self Care | Payer: BC Managed Care – PPO | Attending: Emergency Medicine | Admitting: Emergency Medicine

## 2017-10-08 DIAGNOSIS — R0602 Shortness of breath: Secondary | ICD-10-CM | POA: Diagnosis not present

## 2017-10-08 DIAGNOSIS — R091 Pleurisy: Secondary | ICD-10-CM

## 2017-10-08 DIAGNOSIS — R079 Chest pain, unspecified: Secondary | ICD-10-CM | POA: Insufficient documentation

## 2017-10-08 LAB — CBC
HEMATOCRIT: 35.4 % (ref 35.0–47.0)
HEMOGLOBIN: 12.2 g/dL (ref 12.0–16.0)
MCH: 28.8 pg (ref 26.0–34.0)
MCHC: 34.5 g/dL (ref 32.0–36.0)
MCV: 83.7 fL (ref 80.0–100.0)
Platelets: 375 10*3/uL (ref 150–440)
RBC: 4.23 MIL/uL (ref 3.80–5.20)
RDW: 13.5 % (ref 11.5–14.5)
WBC: 9.3 10*3/uL (ref 3.6–11.0)

## 2017-10-08 LAB — BASIC METABOLIC PANEL
ANION GAP: 7 (ref 5–15)
BUN: 8 mg/dL (ref 6–20)
CALCIUM: 8.8 mg/dL — AB (ref 8.9–10.3)
CHLORIDE: 107 mmol/L (ref 98–111)
CO2: 24 mmol/L (ref 22–32)
Creatinine, Ser: 0.71 mg/dL (ref 0.44–1.00)
GFR calc non Af Amer: >60 mL/min (ref >60)
Glucose, Bld: 103 mg/dL — ABNORMAL HIGH (ref 70–99)
Potassium: 3.7 mmol/L (ref 3.5–5.1)
Sodium: 138 mmol/L (ref 135–145)

## 2017-10-08 LAB — TROPONIN I: Troponin I: <0.03 ng/mL (ref <0.03)

## 2017-10-08 LAB — FIBRIN DERIVATIVES D-DIMER (ARMC ONLY): Fibrin derivatives D-dimer (ARMC): 244.63 ng/mL (FEU) (ref 0.00–499.00)

## 2017-10-08 LAB — POCT PREGNANCY, URINE: PREG TEST UR: NEGATIVE

## 2017-10-08 NOTE — Discharge Instructions (Addendum)

## 2017-10-08 NOTE — ED Provider Notes (Signed)
[facility] Emergency Department Provider Note  ____________________________________________  Time seen: Approximately 5:27 PM  I have reviewed the triage vital signs and the nursing notes.   HISTORY  Chief Complaint Chest Pain   HPI Cassandra Vazquez is a 31 y.o. female with no significant past medical history who presents for evaluation of chest pain.  Patient reports that the pain started yesterday.  The pain is constant, dull located in the center of her chest and it becomes sharp and pleuritic with coughing or deep inspiration.  The pain is moderate in intensity.  Patient denies coughing, personal or family history of blood clots, recent travel immobilization, exogenous hormones, leg pain or swelling, or history of cancer.  She does not smoke.  No history of asthma or COPD.  She also endorses mild constant shortness of breath since this morning.  Past Medical History:  Diagnosis Date  . History of A2 gestational diabetes 10/22/2016   Dx 7/26    Patient Active Problem List   Diagnosis Date Noted  . Depression, recurrent (HCC) 09/02/2017  . Fatigue 09/02/2017  . Routine physical examination 11/08/2016  . BMI 45.0-49.9, adult (HCC) 10/21/2016    History reviewed. No pertinent surgical history.  Prior to Admission medications   Medication Sig Start Date End Date Taking? Authorizing Provider  buPROPion (WELLBUTRIN XL) 150 MG 24 hr tablet Start 150 mg ER PO qam, increase after 3 days to 300mg  qam Patient taking differently: Take 300 mg by mouth daily.  09/02/17  Yes Arnett, Lyn Records, FNP  ibuprofen (ADVIL,MOTRIN) 600 MG tablet Take 1 tablet (600 mg total) by mouth every 6 (six) hours as needed for moderate pain or cramping. Patient not taking: Reported on 01/27/2017 12/16/16   Larene Beach, DO    Allergies Patient has no known allergies.  Family History  Problem Relation Age of Onset  . Diabetes Mother   . Diabetes Father   . CVA Father   . Colon  cancer Neg Hx   . Breast cancer Neg Hx     Social History Social History   Tobacco Use  . Smoking status: Never Smoker  . Smokeless tobacco: Never Used  Substance Use Topics  . Alcohol use: Yes    Comment: wine one glass per night  . Drug use: No    Review of Systems  Constitutional: Negative for fever. Eyes: Negative for visual changes. ENT: Negative for sore throat. Neck: No neck pain  Cardiovascular: + chest pain. Respiratory: + shortness of breath. Gastrointestinal: Negative for abdominal pain, vomiting or diarrhea. Genitourinary: Negative for dysuria. Musculoskeletal: Negative for back pain. Skin: Negative for rash. Neurological: Negative for headaches, weakness or numbness. Psych: No SI or HI  ____________________________________________   PHYSICAL EXAM:  VITAL SIGNS: ED Triage Vitals  Enc Vitals Group     BP 10/08/17 1440 123/78     Pulse Rate 10/08/17 1440 81     Resp 10/08/17 1440 18     Temp 10/08/17 1440 98.4 F (36.9 C)     Temp Source 10/08/17 1440 Oral     SpO2 10/08/17 1440 99 %     Weight 10/08/17 1439 270 lb (122.5 kg)     Height 10/08/17 1439 5\' 4"  (1.626 m)     Head Circumference --      Peak Flow --      Pain Score 10/08/17 1439 4     Pain Loc --      Pain Edu? --  Excl. in GC? --     Constitutional: Alert and oriented. Well appearing and in no apparent distress. HEENT:      Head: Normocephalic and atraumatic.         Eyes: Conjunctivae are normal. Sclera is non-icteric.       Mouth/Throat: Mucous membranes are moist.       Neck: Supple with no signs of meningismus. Cardiovascular: Regular rate and rhythm. No murmurs, gallops, or rubs. 2+ symmetrical distal pulses are present in all extremities. No JVD. Respiratory: Normal respiratory effort. Lungs are clear to auscultation bilaterally. No wheezes, crackles, or rhonchi.  Gastrointestinal: Soft, non tender, and non distended with positive bowel sounds. No rebound or  guarding. Genitourinary: No CVA tenderness. Musculoskeletal: Nontender with normal range of motion in all extremities. No edema, cyanosis, or erythema of extremities. Neurologic: Normal speech and language. Face is symmetric. Moving all extremities. No gross focal neurologic deficits are appreciated. Skin: Skin is warm, dry and intact. No rash noted. Psychiatric: Mood and affect are normal. Speech and behavior are normal.  ____________________________________________   LABS (all labs ordered are listed, but only abnormal results are displayed)  Labs Reviewed  BASIC METABOLIC PANEL - Abnormal; Notable for the following components:      Result Value   Glucose, Bld 103 (*)    Calcium 8.8 (*)    All other components within normal limits  CBC  TROPONIN I  FIBRIN DERIVATIVES D-DIMER ([facility] ONLY)  POC URINE PREG, ED  POCT PREGNANCY, URINE   ____________________________________________  EKG  ED ECG REPORT I, Nita Sicklearolina Ezell Melikian, the attending physician, personally viewed and interpreted this ECG.  Normal sinus rhythm, rate of 76, normal intervals, normal axis, no ST elevations or depressions.  Normal EKG. ____________________________________________  RADIOLOGY  I have personally reviewed the images performed during this visit and I agree with the Radiologist's read.   Interpretation by Radiologist:  Dg Chest 2 View  Result Date: 10/08/2017 CLINICAL DATA:  Pt states she has had Mid chest pain and SOB x 1 day. Sharp pains when taking a deep breath. EXAM: CHEST - 2 VIEW COMPARISON:  None. FINDINGS: The heart size and mediastinal contours are within normal limits. Both lungs are clear. No pleural effusion or pneumothorax. The visualized skeletal structures are unremarkable. IMPRESSION: No active cardiopulmonary disease. Electronically Signed   By: Amie Portlandavid  Ormond M.D.   On: 10/08/2017 15:02     ____________________________________________   PROCEDURES  Procedure(s) performed:  None Procedures Critical Care performed:  None ____________________________________________   INITIAL IMPRESSION / ASSESSMENT AND PLAN / ED COURSE  32 y.o. female with no significant past medical history who presents for evaluation of constant pleuritic central chest pain and SOb since yesterday.  Patient is well-appearing, no distress, she has normal vital signs, normal work of breathing, satting 99% on room air, lungs are clear, there is no pitting edema of her extremities.  EKG shows no evidence of ischemia.  Chest x-ray shows no evidence of pneumothorax or pneumonia.  Will check a d-dimer to rule out PE.  Also the differential diagnosis pleurisy.  No clinical signs or symptoms of dissection, ACS, myocarditis or pericarditis.  Clinical Course as of Oct 08 2009  Sat Oct 08, 2017  2010 Chest x-ray, EKG, troponin, d-dimer, and labs with no acute findings.  Patient's presentation most likely concerning for pleurisy.  Will discharge home on NSAIDs.  Discussed strict return precautions for new or worsening chest pain, shortness of breath, fever, dizziness.  Otherwise she will  follow-up with her doctor Monday.   [CV]    Clinical Course User Index [CV] Don Perking Washington, MD     As part of my medical decision making, I reviewed the following data within the electronic MEDICAL RECORD NUMBER Nursing notes reviewed and incorporated, Labs reviewed , EKG interpreted , Old EKG reviewed, Old chart reviewed, Radiograph reviewed , Notes from prior ED visits and [place] Controlled Substance Database    Pertinent labs & imaging results that were available during my care of the patient were reviewed by me and considered in my medical decision making (see chart for details).    ____________________________________________   FINAL CLINICAL IMPRESSION(S) / ED DIAGNOSES  Final diagnoses:  Chest pain, unspecified type  Pleurisy      NEW MEDICATIONS STARTED DURING THIS VISIT:  ED Discharge Orders    None        Note:  This document was prepared using Dragon voice recognition software and may include unintentional dictation errors.    Nita Sickle, MD 10/08/17 2011

## 2017-10-08 NOTE — ED Triage Notes (Signed)
Pt arrives to ED c/o of central CP that began yesterday, states pain when deep breath. Denies productive cough. States SOB. No SOB noticed. Denies N&V& diaphoresis.   Alert, oriented, ambulatory. No distress noted.

## 2018-01-16 ENCOUNTER — Encounter: Payer: Self-pay | Admitting: *Deleted

## 2018-01-16 ENCOUNTER — Other Ambulatory Visit (INDEPENDENT_AMBULATORY_CARE_PROVIDER_SITE_OTHER): Payer: BC Managed Care – PPO | Admitting: *Deleted

## 2018-01-16 VITALS — BP 130/80 | HR 85

## 2018-01-16 DIAGNOSIS — Z3201 Encounter for pregnancy test, result positive: Secondary | ICD-10-CM | POA: Diagnosis not present

## 2018-01-16 NOTE — Progress Notes (Signed)
Pt here for a UPT. Had a positive pregnancy test at home. LMP 11/29/17 (exact). Pt has started taking prenatal vitamins with folic acid.  UPT results positive.  According to LMP pt is 6weeks 6 days pregnant. Pt will make follow up appointment.   Scheryl Marten, RN

## 2018-01-17 NOTE — Progress Notes (Signed)
I have reviewed the chart and agree with nursing staff's documentation of this patient's encounter.  Jaynie Collins, MD 01/17/2018 12:44 PM

## 2018-02-21 ENCOUNTER — Ambulatory Visit (INDEPENDENT_AMBULATORY_CARE_PROVIDER_SITE_OTHER): Payer: BC Managed Care – PPO | Admitting: Obstetrics & Gynecology

## 2018-02-21 ENCOUNTER — Encounter: Payer: Self-pay | Admitting: Obstetrics & Gynecology

## 2018-02-21 VITALS — BP 115/74 | HR 74 | Wt 268.4 lb

## 2018-02-21 DIAGNOSIS — O0990 Supervision of high risk pregnancy, unspecified, unspecified trimester: Secondary | ICD-10-CM

## 2018-02-21 DIAGNOSIS — Z8632 Personal history of gestational diabetes: Secondary | ICD-10-CM

## 2018-02-21 DIAGNOSIS — O09291 Supervision of pregnancy with other poor reproductive or obstetric history, first trimester: Secondary | ICD-10-CM

## 2018-02-21 DIAGNOSIS — O283 Abnormal ultrasonic finding on antenatal screening of mother: Secondary | ICD-10-CM

## 2018-02-21 DIAGNOSIS — O0991 Supervision of high risk pregnancy, unspecified, first trimester: Secondary | ICD-10-CM

## 2018-02-21 DIAGNOSIS — O99213 Obesity complicating pregnancy, third trimester: Secondary | ICD-10-CM

## 2018-02-21 DIAGNOSIS — O09299 Supervision of pregnancy with other poor reproductive or obstetric history, unspecified trimester: Secondary | ICD-10-CM

## 2018-02-21 DIAGNOSIS — E559 Vitamin D deficiency, unspecified: Secondary | ICD-10-CM

## 2018-02-21 LAB — POCT URINE PREGNANCY: Preg Test, Ur: POSITIVE — AB

## 2018-02-21 NOTE — Progress Notes (Signed)
Last pap 05/19/2016- Normal  Declined flu

## 2018-02-21 NOTE — Addendum Note (Signed)
Addended by: Jaynie CollinsANYANWU, Tyra Gural A on: 02/21/2018 04:48 PM   Modules accepted: Orders

## 2018-02-21 NOTE — Progress Notes (Addendum)
Subjective:   Cassandra Vazquez is a 32 y.o. [phone] at 8959w0d by LMP being seen today for her first obstetrical visit.  Her obstetrical history is significant for obesity and history of GDM. Patient does intend to breast feed. Pregnancy history fully reviewed.  Patient reports no complaints.  HISTORY: OB History  Gravida Para Term Preterm AB Living  4 3 3  0 0 3  SAB TAB Ectopic Multiple Live Births  0 0 0 0 3    # Outcome Date GA Lbr Len/2nd Weight Sex Delivery Anes PTL Lv  4 Current           3 Term 12/15/16 7571w0d 02:10 / 00:03 6 lb 7.2 oz (2.926 kg) F Vag-Spont EPI  LIV     Name: Deremer,GIRL Netha     Apgar1: 8  Apgar5: 9  2 Term           1 Term             Obstetric Comments  2015 and 2013 TSVD; no issues or problems during those pregnancies. Largest was 8lbs 15oz. Both children are healthy    Last pap smear was done 05/19/2016 and was normal  Past Medical History:  Diagnosis Date  . History of A2 gestational diabetes 10/22/2016   Negative postpartum 2 hr GTT   History reviewed. No pertinent surgical history. Family History  Problem Relation Age of Onset  . Diabetes Mother   . Diabetes Father   . CVA Father   . Colon cancer Neg Hx   . Breast cancer Neg Hx    Social History   Tobacco Use  . Smoking status: Never Smoker  . Smokeless tobacco: Never Used  Substance Use Topics  . Alcohol use: Yes    Comment: wine one glass per night  . Drug use: No   No Known Allergies Current Outpatient Medications on File Prior to Visit  Medication Sig Dispense Refill  . buPROPion (WELLBUTRIN XL) 150 MG 24 hr tablet Start 150 mg ER PO qam, increase after 3 days to 300mg  qam (Patient taking differently: Take 300 mg by mouth daily. ) 60 tablet 3  . ibuprofen (ADVIL,MOTRIN) 600 MG tablet Take 1 tablet (600 mg total) by mouth every 6 (six) hours as needed for moderate pain or cramping. (Patient not taking: Reported on 01/27/2017) 30 tablet 0   No current facility-administered  medications on file prior to visit.     Review of Systems Pertinent items noted in HPI and remainder of comprehensive ROS otherwise negative.  Exam   Vitals:   02/21/18 1535  BP: 115/74  Pulse: 74  Weight: 268 lb 6.4 oz (121.7 kg)      General: well-developed, well-nourished female in no acute distress  Breasts:  normal appearance, no masses or tenderness bilaterally  Skin: normal coloration and turgor, no rashes  Neurologic: oriented, normal, negative, normal mood  Extremities: normal strength, tone, and muscle mass, ROM of all joints is normal  HEENT PERRLA, extraocular movement intact and sclera clear, anicteric  Mouth/Teeth mucous membranes moist, pharynx normal without lesions and dental hygiene good  Neck supple and no masses  Cardiovascular: regular rate and rhythm  Respiratory:  no respiratory distress, normal breath sounds  Abdomen: soft, non-tender; bowel sounds normal; no masses,  no organomegaly   Bedside Ultrasound for FHR check: Single viable IUP with FHR160 bpm.  There was a large fundal mass noted, possibly intrauterine.  Will send for formal ultrasound ASAP for further characterization.  Patient informed that the ultrasound is considered a limited obstetric ultrasound and is not intended to be a complete ultrasound exam.  Patient also informed that the ultrasound is not being completed with the intent of assessing for fetal or placental anomalies or any pelvic abnormalities.  Explained that the purpose of today's ultrasound is to assess for fetal heart rate.  Patient acknowledges the purpose of the exam and the limitations of the study.    Assessment:   Pregnancy: [id number] Patient Active Problem List   Diagnosis Date Noted  . Depression, recurrent (HCC) 09/02/2017  . History of gestational diabetes in prior pregnancy, currently pregnant 10/22/2016  . Supervision of high risk pregnancy, antepartum 10/21/2016  . BMI 45.0-49.9, adult (HCC) 10/21/2016     Plan:    1. Abnormal obstetric ultrasound scan Will evaluate ?uterine mass - US OB Comp Less 14 Wks; Future - US OB Transvaginal; Future  2. History of gestational diabetes in prior pregnancy, currently pregnant Labs drawn, will get early 2 hr GTT later - Comprehensive metabolic panel - Hemoglobin A1c - TSH - Protein / creatinine ratio, urine - Glucose Tolerance, 2 Hours w/1 Hour; Future - [place] MFM OB DETAIL +14 WK; Future  3. Obesity in pregnancy, antepartum, third trimester Anatomy scan also ordered - US MFM OB DETAIL +14 WK; Future  4. Supervision of high risk pregnancy, antepartum - Obstetric Panel, Including HIV - Culture, OB Urine - Genetic Screening - Vitamin D (25 hydroxy) - Enroll Patient in Babyscripts - GC probe amplification, urine - [place] MFM OB DETAIL +14 WK; Future Prenatal labs ordered, will follow up results and manage accordingly. Declined carrier screening (Hemoglobinopathy, CF and SMA screen) due to cost. Continue prenatal vitamins. Genetic Screening discussed, NIPS: ordered. Ultrasound discussed; fetal anatomic survey: ordered. Problem list reviewed and updated. The nature of [facility] with multiple MDs and other Advanced Practice Providers was explained to patient; also emphasized that residents, students are part of our team. Routine obstetric precautions reviewed. Return in about 4 weeks (around 03/21/2018) for OB Visit.     Jaynie Collins, MD, FACOG Obstetrician & Gynecologist, [facility] for [employer], [facility]

## 2018-02-21 NOTE — Patient Instructions (Signed)
First Trimester of Pregnancy The first trimester of pregnancy is from week 1 until the end of week 13 (months 1 through 3). A week after a sperm fertilizes an egg, the egg will implant on the wall of the uterus. This embryo will begin to develop into a baby. Genes from you and your partner will form the baby. The female genes will determine whether the baby will be a boy or a girl. At 6-8 weeks, the eyes and face will be formed, and the heartbeat can be seen on ultrasound. At the end of 12 weeks, all the baby's organs will be formed. Now that you are pregnant, you will want to do everything you can to have a healthy baby. Two of the most important things are to get good prenatal care and to follow your health care provider's instructions. Prenatal care is all the medical care you receive before the baby's birth. This care will help prevent, find, and treat any problems during the pregnancy and childbirth. Body changes during your first trimester Your body goes through many changes during pregnancy. The changes vary from woman to woman.  You may gain or lose a couple of pounds at first.  You may feel sick to your stomach (nauseous) and you may throw up (vomit). If the vomiting is uncontrollable, call your health care provider.  You may tire easily.  You may develop headaches that can be relieved by medicines. All medicines should be approved by your health care provider.  You may urinate more often. Painful urination may mean you have a bladder infection.  You may develop heartburn as a result of your pregnancy.  You may develop constipation because certain hormones are causing the muscles that push stool through your intestines to slow down.  You may develop hemorrhoids or swollen veins (varicose veins).  Your breasts may begin to grow larger and become tender. Your nipples may stick out more, and the tissue that surrounds them (areola) may become darker.  Your gums may bleed and may be  sensitive to brushing and flossing.  Dark spots or blotches (chloasma, mask of pregnancy) may develop on your face. This will likely fade after the baby is born.  Your menstrual periods will stop.  You may have a loss of appetite.  You may develop cravings for certain kinds of food.  You may have changes in your emotions from day to day, such as being excited to be pregnant or being concerned that something may go wrong with the pregnancy and baby.  You may have more vivid and strange dreams.  You may have changes in your hair. These can include thickening of your hair, rapid growth, and changes in texture. Some women also have hair loss during or after pregnancy, or hair that feels dry or thin. Your hair will most likely return to normal after your baby is born.  What to expect at prenatal visits During a routine prenatal visit:  You will be weighed to make sure you and the baby are growing normally.  Your blood pressure will be taken.  Your abdomen will be measured to track your baby's growth.  The fetal heartbeat will be listened to between weeks 10 and 14 of your pregnancy.  Test results from any previous visits will be discussed.  Your health care provider may ask you:  How you are feeling.  If you are feeling the baby move.  If you have had any abnormal symptoms, such as leaking fluid, bleeding, severe headaches,   or abdominal cramping.  If you are using any tobacco products, including cigarettes, chewing tobacco, and electronic cigarettes.  If you have any questions.  Other tests that may be performed during your first trimester include:  Blood tests to find your blood type and to check for the presence of any previous infections. The tests will also be used to check for low iron levels (anemia) and protein on red blood cells (Rh antibodies). Depending on your risk factors, or if you previously had diabetes during pregnancy, you may have tests to check for high blood  sugar that affects pregnant women (gestational diabetes).  Urine tests to check for infections, diabetes, or protein in the urine.  An ultrasound to confirm the proper growth and development of the baby.  Fetal screens for spinal cord problems (spina bifida) and Down syndrome.  HIV (human immunodeficiency virus) testing. Routine prenatal testing includes screening for HIV, unless you choose not to have this test.  You may need other tests to make sure you and the baby are doing well.  Follow these instructions at home: Medicines  Follow your health care provider's instructions regarding medicine use. Specific medicines may be either safe or unsafe to take during pregnancy.  Take a prenatal vitamin that contains at least 600 micrograms (mcg) of folic acid.  If you develop constipation, try taking a stool softener if your health care provider approves. Eating and drinking  Eat a balanced diet that includes fresh fruits and vegetables, whole grains, good sources of protein such as meat, eggs, or tofu, and low-fat dairy. Your health care provider will help you determine the amount of weight gain that is right for you.  Avoid raw meat and uncooked cheese. These carry germs that can cause birth defects in the baby.  Eating four or five small meals rather than three large meals a day may help relieve nausea and vomiting. If you start to feel nauseous, eating a few soda crackers can be helpful. Drinking liquids between meals, instead of during meals, also seems to help ease nausea and vomiting.  Limit foods that are high in fat and processed sugars, such as fried and sweet foods.  To prevent constipation: ? Eat foods that are high in fiber, such as fresh fruits and vegetables, whole grains, and beans. ? Drink enough fluid to keep your urine clear or pale yellow. Activity  Exercise only as directed by your health care provider. Most women can continue their usual exercise routine during  pregnancy. Try to exercise for 30 minutes at least 5 days a week. Exercising will help you: ? Control your weight. ? Stay in shape. ? Be prepared for labor and delivery.  Experiencing pain or cramping in the lower abdomen or lower back is a good sign that you should stop exercising. Check with your health care provider before continuing with normal exercises.  Try to avoid standing for long periods of time. Move your legs often if you must stand in one place for a long time.  Avoid heavy lifting.  Wear low-heeled shoes and practice good posture.  You may continue to have sex unless your health care provider tells you not to. Relieving pain and discomfort  Wear a good support bra to relieve breast tenderness.  Take warm sitz baths to soothe any pain or discomfort caused by hemorrhoids. Use hemorrhoid cream if your health care provider approves.  Rest with your legs elevated if you have leg cramps or low back pain.  If you develop   varicose veins in your legs, wear support hose. Elevate your feet for 15 minutes, 3-4 times a day. Limit salt in your diet. Prenatal care  Schedule your prenatal visits by the twelfth week of pregnancy. They are usually scheduled monthly at first, then more often in the last 2 months before delivery.  Write down your questions. Take them to your prenatal visits.  Keep all your prenatal visits as told by your health care provider. This is important. Safety  Wear your seat belt at all times when driving.  Make a list of emergency phone numbers, including numbers for family, friends, the hospital, and police and fire departments. General instructions  Ask your health care provider for a referral to a local prenatal education class. Begin classes no later than the beginning of month 6 of your pregnancy.  Ask for help if you have counseling or nutritional needs during pregnancy. Your health care provider can offer advice or refer you to specialists for help  with various needs.  Do not use hot tubs, steam rooms, or saunas.  Do not douche or use tampons or scented sanitary pads.  Do not cross your legs for long periods of time.  Avoid cat litter boxes and soil used by cats. These carry germs that can cause birth defects in the baby and possibly loss of the fetus by miscarriage or stillbirth.  Avoid all smoking, herbs, alcohol, and medicines not prescribed by your health care provider. Chemicals in these products affect the formation and growth of the baby.  Do not use any products that contain nicotine or tobacco, such as cigarettes and e-cigarettes. If you need help quitting, ask your health care provider. You may receive counseling support and other resources to help you quit.  Schedule a dentist appointment. At home, brush your teeth with a soft toothbrush and be gentle when you floss. Contact a health care provider if:  You have dizziness.  You have mild pelvic cramps, pelvic pressure, or nagging pain in the abdominal area.  You have persistent nausea, vomiting, or diarrhea.  You have a bad smelling vaginal discharge.  You have pain when you urinate.  You notice increased swelling in your face, hands, legs, or ankles.  You are exposed to fifth disease or chickenpox.  You are exposed to German measles (rubella) and have never had it. Get help right away if:  You have a fever.  You are leaking fluid from your vagina.  You have spotting or bleeding from your vagina.  You have severe abdominal cramping or pain.  You have rapid weight gain or loss.  You vomit blood or material that looks like coffee grounds.  You develop a severe headache.  You have shortness of breath.  You have any kind of trauma, such as from a fall or a car accident. Summary  The first trimester of pregnancy is from week 1 until the end of week 13 (months 1 through 3).  Your body goes through many changes during pregnancy. The changes vary from  woman to woman.  You will have routine prenatal visits. During those visits, your health care provider will examine you, discuss any test results you may have, and talk with you about how you are feeling. This information is not intended to replace advice given to you by your health care provider. Make sure you discuss any questions you have with your health care provider. Document Released: 03/09/2001 Document Revised: 02/25/2016 Document Reviewed: 02/25/2016 Elsevier Interactive Patient Education  2018 Elsevier   Inc.  

## 2018-02-22 ENCOUNTER — Other Ambulatory Visit: Payer: BC Managed Care – PPO

## 2018-02-22 ENCOUNTER — Telehealth: Payer: Self-pay | Admitting: Family Medicine

## 2018-02-22 ENCOUNTER — Ambulatory Visit
Admission: RE | Admit: 2018-02-22 | Discharge: 2018-02-22 | Disposition: A | Discharge status: Home / Self Care | Payer: BC Managed Care – PPO | Source: Ambulatory Visit | Attending: Obstetrics & Gynecology | Admitting: Obstetrics & Gynecology

## 2018-02-22 ENCOUNTER — Encounter: Payer: Self-pay | Admitting: Family Medicine

## 2018-02-22 ENCOUNTER — Encounter: Payer: Self-pay | Admitting: Obstetrics & Gynecology

## 2018-02-22 DIAGNOSIS — O283 Abnormal ultrasonic finding on antenatal screening of mother: Secondary | ICD-10-CM | POA: Insufficient documentation

## 2018-02-22 DIAGNOSIS — O209 Hemorrhage in early pregnancy, unspecified: Secondary | ICD-10-CM | POA: Insufficient documentation

## 2018-02-22 DIAGNOSIS — Z3A12 12 weeks gestation of pregnancy: Secondary | ICD-10-CM | POA: Insufficient documentation

## 2018-02-22 DIAGNOSIS — E559 Vitamin D deficiency, unspecified: Secondary | ICD-10-CM | POA: Insufficient documentation

## 2018-02-22 LAB — OBSTETRIC PANEL, INCLUDING HIV
Antibody Screen: NEGATIVE
BASOS ABS: 0 10*3/uL (ref 0.0–0.2)
Basos: 0 %
EOS (ABSOLUTE): 0.2 10*3/uL (ref 0.0–0.4)
Eos: 1 %
HEMOGLOBIN: 10.7 g/dL — AB (ref 11.1–15.9)
HEP B S AG: NEGATIVE
HIV Screen 4th Generation wRfx: NONREACTIVE
Hematocrit: 32.5 % — ABNORMAL LOW (ref 34.0–46.6)
IMMATURE GRANS (ABS): 0 10*3/uL (ref 0.0–0.1)
Immature Granulocytes: 0 %
LYMPHS ABS: 2.6 10*3/uL (ref 0.7–3.1)
LYMPHS: 22 %
MCH: 26.8 pg (ref 26.6–33.0)
MCHC: 32.9 g/dL (ref 31.5–35.7)
MCV: 82 fL (ref 79–97)
MONOS ABS: 0.5 10*3/uL (ref 0.1–0.9)
Monocytes: 5 %
Neutrophils Absolute: 8.4 10*3/uL — ABNORMAL HIGH (ref 1.4–7.0)
Neutrophils: 72 %
PLATELETS: 355 10*3/uL (ref 150–450)
RBC: 3.99 x10E6/uL (ref 3.77–5.28)
RDW: 12.6 % (ref 12.3–15.4)
RPR: NONREACTIVE
Rh Factor: POSITIVE
Rubella Antibodies, IGG: 1.19 index (ref >0.99)
WBC: 11.8 10*3/uL — AB (ref 3.4–10.8)

## 2018-02-22 LAB — COMPREHENSIVE METABOLIC PANEL
ALT: 25 IU/L (ref 0–32)
AST: 19 IU/L (ref 0–40)
Albumin/Globulin Ratio: 1.4 (ref 1.2–2.2)
Albumin: 4 g/dL (ref 3.5–5.5)
Alkaline Phosphatase: 64 IU/L (ref 39–117)
BUN/Creatinine Ratio: 21 (ref 9–23)
BUN: 10 mg/dL (ref 6–20)
Bilirubin Total: <0.2 mg/dL (ref 0.0–1.2)
CALCIUM: 9.4 mg/dL (ref 8.7–10.2)
CO2: 20 mmol/L (ref 20–29)
CREATININE: 0.48 mg/dL — AB (ref 0.57–1.00)
Chloride: 101 mmol/L (ref 96–106)
GFR calc Af Amer: 152 mL/min/{1.73_m2} (ref >59)
GFR, EST NON AFRICAN AMERICAN: 132 mL/min/{1.73_m2} (ref >59)
Globulin, Total: 2.9 g/dL (ref 1.5–4.5)
Glucose: 80 mg/dL (ref 65–99)
Potassium: 3.9 mmol/L (ref 3.5–5.2)
Sodium: 136 mmol/L (ref 134–144)
TOTAL PROTEIN: 6.9 g/dL (ref 6.0–8.5)

## 2018-02-22 LAB — PROTEIN / CREATININE RATIO, URINE
Creatinine, Urine: 247.6 mg/dL
PROTEIN UR: 22.3 mg/dL
PROTEIN/CREAT RATIO: 90 mg/g{creat} (ref 0–200)

## 2018-02-22 LAB — HEMOGLOBIN A1C
ESTIMATED AVERAGE GLUCOSE: 108 mg/dL
Hgb A1c MFr Bld: 5.4 % (ref 4.8–5.6)

## 2018-02-22 LAB — VITAMIN D 25 HYDROXY (VIT D DEFICIENCY, FRACTURES): Vit D, 25-Hydroxy: 7.4 ng/mL — ABNORMAL LOW (ref 30.0–100.0)

## 2018-02-22 LAB — TSH: TSH: 1.87 u[IU]/mL (ref 0.450–4.500)

## 2018-02-22 MED ORDER — VITAMIN D (ERGOCALCIFEROL) 1.25 MG (50000 UNIT) PO CAPS: 50000.0000 [IU] | ORAL_CAPSULE | ORAL | 3 refills | Status: DC

## 2018-02-22 NOTE — Addendum Note (Signed)
Addended by: Mirian MoARTER, Ramonica Grigg C on: 02/22/2018 08:11 AM   Modules accepted: Orders

## 2018-02-22 NOTE — Addendum Note (Signed)
Addended by: Jaynie CollinsANYANWU, Carrigan Delafuente A on: 02/22/2018 08:16 AM   Modules accepted: Orders

## 2018-02-22 NOTE — Telephone Encounter (Signed)
Called Cassandra Vazquez to let her know the results of her US which were overall reassuring. Single live IUP dated 1633w4d. Radiology noted a subchorionic hemorrhage and this was discussed with the patient. Patient reports no bleeding or cramping. Reviewed return precautions and s/sx of miscarriage. She was instructed to go to [facility] over the holiday if she developed any concerning symptoms.     Federico FlakeKimberly Niles Emilie Carp, MD, MPH, ABFM Attending Physician Faculty Practice- [facility]

## 2018-02-22 NOTE — Progress Notes (Signed)
Called by the radiology for recent US. Patient was seen for stat OB US and showed live IUP at 5429w4d with subchorionic hemorrhage noted. Final report to come.   Federico FlakeKimberly Niles Imanii Gosdin, MD, MPH, ABFM Attending Physician Faculty Practice- [facility]

## 2018-02-23 LAB — CULTURE, OB URINE

## 2018-02-23 LAB — GLUCOSE TOLERANCE, 2 HOURS W/ 1HR
GLUCOSE, 1 HOUR: 173 mg/dL (ref 65–179)
GLUCOSE, FASTING: 91 mg/dL (ref 65–91)
Glucose, 2 hour: 125 mg/dL (ref 65–152)

## 2018-02-23 LAB — URINE CULTURE, OB REFLEX

## 2018-03-06 ENCOUNTER — Encounter: Payer: Self-pay | Admitting: Radiology

## 2018-03-06 ENCOUNTER — Telehealth: Payer: Self-pay | Admitting: Radiology

## 2018-03-06 NOTE — Telephone Encounter (Signed)
Left message to call [id number] to get results for panorama genetic screening

## 2018-03-20 ENCOUNTER — Encounter: Payer: BC Managed Care – PPO | Admitting: Obstetrics and Gynecology

## 2018-03-29 NOTE — L&D Delivery Note (Signed)
OB/GYN Faculty Practice Delivery Note  Devanee Mai is a 33 y.o. [phone] s/p spontaneous vaginal at [redacted]w[redacted]d. She was admitted for Rupture of Membranes  ROM: 5h 38m with clear fluid GBS Status: Negative Maximum Maternal Temperature: Temp (24hrs), Avg:98.4 F (36.9 C), Min:98.3 F (36.8 C), Max:98.5 F (36.9 C)  Labor Progress: . Admitted in early labor after SROM at 0315 . SVD  Delivery Date/Time:  09/01/18 0900  Delivery: Called to room and patient was complete and pushing. Head delivered LOA. No nuchal cord present. Shoulder and body delivered in usual fashion. Infant with spontaneous cry, placed on mother's abdomen, dried and stimulated. Cord clamped x 2 after 1-minute delay, and cut by FOB. Cord blood drawn. Placenta delivered spontaneously with gentle cord traction. Fundus firm with massage and Pitocin. Labia, perineum, vagina, and cervix inspected and found to have 1st degree labial that did not require repair.  Placenta: spontaneous , intact Complications: precipitous delivery Lacerations: 1st degree EBL: Analgesia: IV medication  Postpartum Planning [ ]  Lactation  Infant: Viable female  9/9  3285g  Genia Hotter, M.D.  Family Medicine  PGY-1 09/01/2018 9:44 AM

## 2018-03-30 ENCOUNTER — Ambulatory Visit (INDEPENDENT_AMBULATORY_CARE_PROVIDER_SITE_OTHER): Payer: BC Managed Care – PPO | Admitting: Obstetrics and Gynecology

## 2018-03-30 ENCOUNTER — Encounter: Payer: Self-pay | Admitting: Obstetrics and Gynecology

## 2018-03-30 VITALS — BP 121/79 | HR 97 | Wt 266.0 lb

## 2018-03-30 DIAGNOSIS — Z6841 Body Mass Index (BMI) 40.0 and over, adult: Secondary | ICD-10-CM

## 2018-03-30 DIAGNOSIS — O09299 Supervision of pregnancy with other poor reproductive or obstetric history, unspecified trimester: Secondary | ICD-10-CM

## 2018-03-30 DIAGNOSIS — O0990 Supervision of high risk pregnancy, unspecified, unspecified trimester: Secondary | ICD-10-CM

## 2018-03-30 DIAGNOSIS — O0992 Supervision of high risk pregnancy, unspecified, second trimester: Secondary | ICD-10-CM

## 2018-03-30 DIAGNOSIS — F339 Major depressive disorder, recurrent, unspecified: Secondary | ICD-10-CM

## 2018-03-30 DIAGNOSIS — Z8632 Personal history of gestational diabetes: Secondary | ICD-10-CM

## 2018-03-30 DIAGNOSIS — O09292 Supervision of pregnancy with other poor reproductive or obstetric history, second trimester: Secondary | ICD-10-CM

## 2018-03-30 MED ORDER — ASPIRIN EC 81 MG PO TBEC: 81.0000 mg | DELAYED_RELEASE_TABLET | Freq: Every day | ORAL | 10 refills | Status: DC

## 2018-03-30 NOTE — Progress Notes (Signed)
Prenatal Visit Note Date: 03/30/2018 [facility]: [facility]  Subjective:  Cassandra Vazquez is a 33 y.o. [phone] at [redacted]w[redacted]d being seen today for ongoing prenatal care.  She is currently monitored for the following issues for this high-risk pregnancy and has Supervision of high risk pregnancy, antepartum; BMI 45.0-49.9, adult (HCC); History of gestational diabetes in prior pregnancy, currently pregnant; Depression, recurrent (HCC); and Vitamin D deficiency on their problem list.  Patient reports no complaints.   Contractions: Not present. Vag. Bleeding: None.  Movement: Present. Denies leaking of fluid.   The following portions of the patient's history were reviewed and updated as appropriate: allergies, current medications, past family history, past medical history, past social history, past surgical history and problem list. Problem list updated.  Objective:   Vitals:   03/30/18 1619  BP: 121/79  Pulse: 97  Weight: 266 lb (120.7 kg)    Fetal Status: Fetal Heart Rate (bpm): 147   Movement: Present     General:  Alert, oriented and cooperative. Patient is in no acute distress.  Skin: Skin is warm and dry. No rash noted.   Cardiovascular: Normal heart rate noted  Respiratory: Normal respiratory effort, no problems with respiration noted  Abdomen: Soft, gravid, appropriate for gestational age. Pain/Pressure: Absent     Pelvic:  Cervical exam deferred        Extremities: Normal range of motion.  Edema: None  Mental Status: Normal mood and affect. Normal behavior. Normal judgment and thought content.   Urinalysis:      Assessment and Plan:  Pregnancy: G4P3003 at [redacted]w[redacted]d  1. Supervision of high risk pregnancy, antepartum Routine care. Declines afp. Will set up for routine anatomy u/s. Pt amenable to starting low dose asa  2. BMI 45.0-49.9, adult (HCC) Weight stable  3. History of gestational diabetes in prior pregnancy, currently pregnant Early screening negative  4.  Depression, recurrent (HCC) Doing well on no meds  Preterm labor symptoms and general obstetric precautions including but not limited to vaginal bleeding, contractions, leaking of fluid and fetal movement were reviewed in detail with the patient. Please refer to After Visit Summary for other counseling recommendations.  Return in about 3 weeks (around 04/20/2018) for rob.   [place] Bing, MD

## 2018-04-06 ENCOUNTER — Ambulatory Visit (HOSPITAL_COMMUNITY): Admission: RE | Admit: 2018-04-06 | Payer: BC Managed Care – PPO | Source: Ambulatory Visit

## 2018-04-10 ENCOUNTER — Encounter (HOSPITAL_COMMUNITY): Payer: Self-pay

## 2018-04-17 ENCOUNTER — Encounter (HOSPITAL_COMMUNITY): Payer: Self-pay

## 2018-04-17 ENCOUNTER — Ambulatory Visit (HOSPITAL_COMMUNITY)
Admission: RE | Admit: 2018-04-17 | Discharge: 2018-04-17 | Disposition: A | Discharge status: Home / Self Care | Payer: BC Managed Care – PPO | Source: Ambulatory Visit | Attending: Obstetrics & Gynecology | Admitting: Obstetrics & Gynecology

## 2018-04-17 ENCOUNTER — Other Ambulatory Visit (HOSPITAL_COMMUNITY): Payer: Self-pay | Admitting: *Deleted

## 2018-04-17 DIAGNOSIS — O0990 Supervision of high risk pregnancy, unspecified, unspecified trimester: Secondary | ICD-10-CM

## 2018-04-17 DIAGNOSIS — O99213 Obesity complicating pregnancy, third trimester: Secondary | ICD-10-CM | POA: Insufficient documentation

## 2018-04-17 DIAGNOSIS — O09299 Supervision of pregnancy with other poor reproductive or obstetric history, unspecified trimester: Secondary | ICD-10-CM | POA: Diagnosis present

## 2018-04-17 DIAGNOSIS — Z363 Encounter for antenatal screening for malformations: Secondary | ICD-10-CM | POA: Diagnosis not present

## 2018-04-17 DIAGNOSIS — Z8632 Personal history of gestational diabetes: Secondary | ICD-10-CM | POA: Diagnosis present

## 2018-04-17 DIAGNOSIS — Z3A19 19 weeks gestation of pregnancy: Secondary | ICD-10-CM | POA: Diagnosis not present

## 2018-04-17 DIAGNOSIS — O09292 Supervision of pregnancy with other poor reproductive or obstetric history, second trimester: Secondary | ICD-10-CM | POA: Diagnosis not present

## 2018-04-17 DIAGNOSIS — O99212 Obesity complicating pregnancy, second trimester: Secondary | ICD-10-CM

## 2018-04-17 DIAGNOSIS — Z362 Encounter for other antenatal screening follow-up: Secondary | ICD-10-CM

## 2018-04-24 ENCOUNTER — Encounter: Payer: BC Managed Care – PPO | Admitting: Family Medicine

## 2018-05-02 ENCOUNTER — Encounter: Payer: BC Managed Care – PPO | Admitting: Family Medicine

## 2018-05-06 ENCOUNTER — Emergency Department
Admission: EM | Admit: 2018-05-06 | Discharge: 2018-05-06 | Disposition: A | Discharge status: Home / Self Care | Payer: BC Managed Care – PPO | Attending: Emergency Medicine | Admitting: Emergency Medicine

## 2018-05-06 ENCOUNTER — Encounter: Payer: Self-pay | Admitting: Emergency Medicine

## 2018-05-06 ENCOUNTER — Emergency Department: Payer: BC Managed Care – PPO

## 2018-05-06 ENCOUNTER — Other Ambulatory Visit: Payer: Self-pay

## 2018-05-06 DIAGNOSIS — R55 Syncope and collapse: Secondary | ICD-10-CM | POA: Diagnosis not present

## 2018-05-06 DIAGNOSIS — O98512 Other viral diseases complicating pregnancy, second trimester: Secondary | ICD-10-CM | POA: Diagnosis not present

## 2018-05-06 DIAGNOSIS — J101 Influenza due to other identified influenza virus with other respiratory manifestations: Secondary | ICD-10-CM

## 2018-05-06 DIAGNOSIS — O9989 Other specified diseases and conditions complicating pregnancy, childbirth and the puerperium: Secondary | ICD-10-CM | POA: Diagnosis present

## 2018-05-06 DIAGNOSIS — Z3A00 Weeks of gestation of pregnancy not specified: Secondary | ICD-10-CM | POA: Diagnosis not present

## 2018-05-06 DIAGNOSIS — O2342 Unspecified infection of urinary tract in pregnancy, second trimester: Secondary | ICD-10-CM | POA: Insufficient documentation

## 2018-05-06 DIAGNOSIS — N3000 Acute cystitis without hematuria: Secondary | ICD-10-CM

## 2018-05-06 LAB — INFLUENZA PANEL BY PCR (TYPE A & B)
Influenza A By PCR: POSITIVE — AB
Influenza B By PCR: NEGATIVE

## 2018-05-06 LAB — URINALYSIS, COMPLETE (UACMP) WITH MICROSCOPIC
Bilirubin Urine: NEGATIVE
Glucose, UA: NEGATIVE mg/dL
Hgb urine dipstick: NEGATIVE
KETONES UR: 5 mg/dL — AB
Nitrite: NEGATIVE
Protein, ur: 30 mg/dL — AB
Specific Gravity, Urine: 1.025 (ref 1.005–1.030)
pH: 6 (ref 5.0–8.0)

## 2018-05-06 LAB — COMPREHENSIVE METABOLIC PANEL
ALT: 18 U/L (ref 0–44)
AST: 29 U/L (ref 15–41)
Albumin: 3.1 g/dL — ABNORMAL LOW (ref 3.5–5.0)
Alkaline Phosphatase: 62 U/L (ref 38–126)
Anion gap: 8 (ref 5–15)
BUN: 6 mg/dL (ref 6–20)
CO2: 22 mmol/L (ref 22–32)
CREATININE: 0.39 mg/dL — AB (ref 0.44–1.00)
Calcium: 8.4 mg/dL — ABNORMAL LOW (ref 8.9–10.3)
Chloride: 106 mmol/L (ref 98–111)
GFR calc Af Amer: >60 mL/min (ref >60)
GFR calc non Af Amer: >60 mL/min (ref >60)
Glucose, Bld: 88 mg/dL (ref 70–99)
Potassium: 3.7 mmol/L (ref 3.5–5.1)
Sodium: 136 mmol/L (ref 135–145)
Total Bilirubin: 0.5 mg/dL (ref 0.3–1.2)
Total Protein: 7 g/dL (ref 6.5–8.1)

## 2018-05-06 LAB — CBC
HCT: 35.9 % — ABNORMAL LOW (ref 36.0–46.0)
HEMOGLOBIN: 11.6 g/dL — AB (ref 12.0–15.0)
MCH: 27.6 pg (ref 26.0–34.0)
MCHC: 32.3 g/dL (ref 30.0–36.0)
MCV: 85.5 fL (ref 80.0–100.0)
Platelets: 298 10*3/uL (ref 150–400)
RBC: 4.2 MIL/uL (ref 3.87–5.11)
RDW: 13.1 % (ref 11.5–15.5)
WBC: 6 10*3/uL (ref 4.0–10.5)
nRBC: 0 % (ref 0.0–0.2)

## 2018-05-06 LAB — TROPONIN I: Troponin I: <0.03 ng/mL (ref <0.03)

## 2018-05-06 LAB — LIPASE, BLOOD: Lipase: 36 U/L (ref 11–51)

## 2018-05-06 MED ORDER — ONDANSETRON 4 MG PO TBDP: 4.0000 mg | ORAL_TABLET | Freq: Three times a day (TID) | ORAL | 0 refills | Status: DC | PRN

## 2018-05-06 MED ORDER — ONDANSETRON HCL 4 MG/2ML IJ SOLN
4.0000 mg | Freq: Once | INTRAMUSCULAR | Status: AC
  Administered 2018-05-06: 4 mg via INTRAVENOUS
  Filled 2018-05-06: qty 2

## 2018-05-06 MED ORDER — NITROFURANTOIN MONOHYD MACRO 100 MG PO CAPS: 100.0000 mg | ORAL_CAPSULE | Freq: Two times a day (BID) | ORAL | 0 refills | Status: AC

## 2018-05-06 MED ORDER — NITROFURANTOIN MONOHYD MACRO 100 MG PO CAPS: 100.0000 mg | ORAL_CAPSULE | Freq: Two times a day (BID) | ORAL | 0 refills | Status: DC

## 2018-05-06 MED ORDER — SODIUM CHLORIDE 0.9 % IV SOLN
Freq: Once | INTRAVENOUS | Status: AC
  Administered 2018-05-06: 14:00:00 via INTRAVENOUS

## 2018-05-06 MED ORDER — SODIUM CHLORIDE 0.9% FLUSH: 3.0000 mL | Freq: Once | INTRAVENOUS | Status: DC

## 2018-05-06 NOTE — ED Triage Notes (Signed)
Patient is [redacted] weeks pregnant, states had syncopal episode approx 11am. States woke on ground. States that she has had multiple syncopal episodes during her pregnancies. This is pregnancy number 4. States these episodes are usually preceded by nausea. Denies vaginal bleeding or contractions. Due date 6/3. States concerned this time because she woke up with R sided pain. OB consulted and wishes patient to be cleared in ED first R/T syncopal episode.

## 2018-05-06 NOTE — ED Notes (Signed)
Pt to ultrasound

## 2018-05-06 NOTE — ED Provider Notes (Signed)
[facility] Emergency Department Provider Note       Time seen: ----------------------------------------- 1:28 PM on 05/06/2018 -----------------------------------------   I have reviewed the triage vital signs and the nursing notes.  HISTORY   Chief Complaint Loss of Consciousness and Abdominal Pain    HPI Cassandra Vazquez is a 33 y.o. female with a history of second trimester pregnancy who presents to the ED for syncope.  Patient reports she is G4, has had history of syncope with prior pregnancies.  States these episodes usually preceded by nausea.  She denies any vaginal bleeding or contractions.  Her due date is June 3.  She has recently had flulike symptoms as well.  Past Medical History:  Diagnosis Date  . History of A2 gestational diabetes 10/22/2016   Negative postpartum 2 hr GTT    Patient Active Problem List   Diagnosis Date Noted  . Vitamin D deficiency 02/22/2018  . Depression, recurrent (HCC) 09/02/2017  . History of gestational diabetes in prior pregnancy, currently pregnant 10/22/2016  . Supervision of high risk pregnancy, antepartum 10/21/2016  . BMI 45.0-49.9, adult (HCC) 10/21/2016    History reviewed. No pertinent surgical history.  Allergies Patient has no known allergies.  Social History Social History   Tobacco Use  . Smoking status: Never Smoker  . Smokeless tobacco: Never Used  Substance Use Topics  . Alcohol use: Yes    Comment: wine one glass per night  . Drug use: No   Review of Systems Constitutional: Negative for fever. Cardiovascular: Negative for chest pain. Respiratory: Negative for shortness of breath. Gastrointestinal: Positive for abdominal pain Genitourinary: Negative for dysuria. Musculoskeletal: Negative for back pain. Skin: Negative for rash. Neurological: Negative for headaches, focal weakness or numbness.  All systems negative/normal/unremarkable except as stated in the  HPI  ____________________________________________   PHYSICAL EXAM:  VITAL SIGNS: ED Triage Vitals  Enc Vitals Group     BP 05/06/18 1220 92/65     Pulse Rate 05/06/18 1220 96     Resp 05/06/18 1220 20     Temp 05/06/18 1220 98.4 F (36.9 C)     Temp Source 05/06/18 1220 Oral     SpO2 05/06/18 1220 99 %     Weight 05/06/18 1223 270 lb (122.5 kg)     Height 05/06/18 1223 5\' 4"  (1.626 m)     Head Circumference --      Peak Flow --      Pain Score 05/06/18 1223 7     Pain Loc --      Pain Edu? --      Excl. in GC? --    Constitutional: Alert and oriented. Well appearing and in no distress. Eyes: Conjunctivae are normal. Normal extraocular movements. ENT      Head: Normocephalic and atraumatic.      Nose: No congestion/rhinnorhea.      Mouth/Throat: Mucous membranes are moist.      Neck: No stridor. Cardiovascular: Normal rate, regular rhythm. No murmurs, rubs, or gallops. Respiratory: Normal respiratory effort without tachypnea nor retractions. Breath sounds are clear and equal bilaterally. No wheezes/rales/rhonchi. Gastrointestinal: Gravid uterus, right flank tenderness Musculoskeletal: Nontender with normal range of motion in extremities. No lower extremity tenderness nor edema. Neurologic:  Normal speech and language. No gross focal neurologic deficits are appreciated.  Skin:  Skin is warm, dry and intact. No rash noted. Psychiatric: Mood and affect are normal. Speech and behavior are normal.  ____________________________________________  EKG: Interpreted by me.  Sinus rhythm  the rate of 98 bpm, normal PR interval, normal QRS, normal QT  ____________________________________________  ED COURSE:  As part of my medical decision making, I reviewed the following data within the electronic MEDICAL RECORD NUMBER History obtained from family if available, nursing notes, old chart and ekg, as well as notes from prior ED visits. Patient presented for syncope in the second trimester  pregnancy with right-sided abdominal pain, we will assess with labs and imaging as indicated at this time.   Procedures ____________________________________________   LABS (pertinent positives/negatives)  Labs Reviewed  CBC - Abnormal; Notable for the following components:      Result Value   Hemoglobin 11.6 (*)    HCT 35.9 (*)    All other components within normal limits  URINALYSIS, COMPLETE (UACMP) WITH MICROSCOPIC - Abnormal; Notable for the following components:   Color, Urine AMBER (*)    APPearance CLOUDY (*)    Ketones, ur 5 (*)    Protein, ur 30 (*)    Leukocytes, UA MODERATE (*)    Bacteria, UA RARE (*)    All other components within normal limits  COMPREHENSIVE METABOLIC PANEL - Abnormal; Notable for the following components:   Creatinine, Ser 0.39 (*)    Calcium 8.4 (*)    Albumin 3.1 (*)    All other components within normal limits  INFLUENZA PANEL BY PCR (TYPE A & B) - Abnormal; Notable for the following components:   Influenza A By PCR POSITIVE (*)    All other components within normal limits  URINE CULTURE  LIPASE, BLOOD  TROPONIN I    RADIOLOGY Images were viewed by me  Pregnancy ultrasound, renal ultrasound IMPRESSION: Single living intrauterine fetus in transverse presentation.  Limited biometry correlates well with clinical dating.  Amniotic fluid volume is normal.  This exam is performed on an emergent basis and does not comprehensively evaluate fetal size, dating, or anatomy; follow-up complete OB US should be considered if further fetal assessment is Warranted. IMPRESSION: No hydronephrosis.  ____________________________________________   DIFFERENTIAL DIAGNOSIS   Renal colic, UTI, pyelonephritis, abdominal pain and pregnancy, cholelithiasis, normal pregnancy, influenza, dehydration  FINAL ASSESSMENT AND PLAN  Syncope, second trimester pregnancy, UTI, influenza   Plan: The patient had presented for syncope in the second  trimester with flank pain. Patient's labs were negative with the exception of influenza and borderline UTI. Patient's imaging did not reveal any acute process.  She was given IV fluids and Zofran here.  She has been sick for 4 days and I think Tamiflu would be of little utility.  She is cleared for outpatient follow-up.   Ulice DashJohnathan E Briann Sarchet, MD    Note: This note was generated in part or whole with voice recognition software. Voice recognition is usually quite accurate but there are transcription errors that can and very often do occur. I apologize for any typographical errors that were not detected and corrected.     Emily FilbertWilliams, Krishav Mamone E, MD 05/06/18 [phone]
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
## 2018-05-08 ENCOUNTER — Encounter: Payer: BC Managed Care – PPO | Admitting: Obstetrics & Gynecology

## 2018-05-08 LAB — URINE CULTURE: Special Requests: NORMAL

## 2018-05-11 ENCOUNTER — Encounter: Payer: BC Managed Care – PPO | Admitting: Obstetrics and Gynecology

## 2018-05-15 ENCOUNTER — Ambulatory Visit (HOSPITAL_COMMUNITY): Payer: BC Managed Care – PPO

## 2018-05-17 ENCOUNTER — Encounter: Payer: BC Managed Care – PPO | Admitting: Advanced Practice Midwife

## 2018-05-19 ENCOUNTER — Ambulatory Visit (HOSPITAL_COMMUNITY): Admission: RE | Admit: 2018-05-19 | Payer: BC Managed Care – PPO | Source: Ambulatory Visit

## 2018-05-19 ENCOUNTER — Encounter (HOSPITAL_COMMUNITY): Payer: Self-pay

## 2018-06-06 ENCOUNTER — Ambulatory Visit (INDEPENDENT_AMBULATORY_CARE_PROVIDER_SITE_OTHER): Payer: BC Managed Care – PPO | Admitting: Obstetrics and Gynecology

## 2018-06-06 VITALS — BP 127/79 | HR 108 | Wt 260.0 lb

## 2018-06-06 DIAGNOSIS — Z3A27 27 weeks gestation of pregnancy: Secondary | ICD-10-CM

## 2018-06-06 DIAGNOSIS — O0990 Supervision of high risk pregnancy, unspecified, unspecified trimester: Secondary | ICD-10-CM

## 2018-06-06 DIAGNOSIS — O0992 Supervision of high risk pregnancy, unspecified, second trimester: Secondary | ICD-10-CM

## 2018-06-06 NOTE — Progress Notes (Signed)
Prenatal Visit Note Date: 06/06/2018 Clinic: [facility]  Subjective:  Cassandra Vazquez is a 33 y.o. [phone] at [redacted]w[redacted]d being seen today for ongoing prenatal care.  She is currently monitored for the following issues for this high-risk pregnancy and has Supervision of high risk pregnancy, antepartum; BMI 45.0-49.9, adult (HCC); History of gestational diabetes in prior pregnancy, currently pregnant; Depression, recurrent (HCC); and Vitamin D deficiency on their problem list.  Patient reports no complaints.   Contractions: Not present. Vag. Bleeding: None.  Movement: Present. Denies leaking of fluid.   The following portions of the patient's history were reviewed and updated as appropriate: allergies, current medications, past family history, past medical history, past social history, past surgical history and problem list. Problem list updated.  Objective:   Vitals:   06/06/18 1554  BP: 127/79  Pulse: (!) 108  Weight: 260 lb (117.9 kg)    Fetal Status: Fetal Heart Rate (bpm): 155 Fundal Height: 30 cm Movement: Present     General:  Alert, oriented and cooperative. Patient is in no acute distress.  Skin: Skin is warm and dry. No rash noted.   Cardiovascular: Normal heart rate noted  Respiratory: Normal respiratory effort, no problems with respiration noted  Abdomen: Soft, gravid, appropriate for gestational age. Pain/Pressure: Absent     Pelvic:  Cervical exam deferred        Extremities: Normal range of motion.  Edema: None  Mental Status: Normal mood and affect. Normal behavior. Normal judgment and thought content.   Urinalysis:      Assessment and Plan:  Pregnancy: G3T5176 at [redacted]w[redacted]d  Routine care. 28wks labs nv Preterm labor symptoms and general obstetric precautions including but not limited to vaginal bleeding, contractions, leaking of fluid and fetal movement were reviewed in detail with the patient. Please refer to After Visit Summary for other counseling  recommendations.  Return in about 1 week (around 06/13/2018) for 2hr GTT. 2wk hrob.   [place] Bing, MD

## 2018-06-14 ENCOUNTER — Other Ambulatory Visit: Payer: Self-pay

## 2018-06-14 ENCOUNTER — Ambulatory Visit (INDEPENDENT_AMBULATORY_CARE_PROVIDER_SITE_OTHER): Payer: BC Managed Care – PPO | Admitting: Advanced Practice Midwife

## 2018-06-14 VITALS — BP 118/72 | HR 92 | Wt 263.2 lb

## 2018-06-14 DIAGNOSIS — Z3A29 29 weeks gestation of pregnancy: Secondary | ICD-10-CM

## 2018-06-14 DIAGNOSIS — Z8632 Personal history of gestational diabetes: Secondary | ICD-10-CM

## 2018-06-14 DIAGNOSIS — O09299 Supervision of pregnancy with other poor reproductive or obstetric history, unspecified trimester: Secondary | ICD-10-CM

## 2018-06-14 DIAGNOSIS — Z6841 Body Mass Index (BMI) 40.0 and over, adult: Secondary | ICD-10-CM

## 2018-06-14 DIAGNOSIS — Z0489 Encounter for examination and observation for other specified reasons: Secondary | ICD-10-CM

## 2018-06-14 DIAGNOSIS — O09293 Supervision of pregnancy with other poor reproductive or obstetric history, third trimester: Secondary | ICD-10-CM

## 2018-06-14 DIAGNOSIS — Z3483 Encounter for supervision of other normal pregnancy, third trimester: Secondary | ICD-10-CM

## 2018-06-14 DIAGNOSIS — O99213 Obesity complicating pregnancy, third trimester: Secondary | ICD-10-CM

## 2018-06-14 DIAGNOSIS — Z0283 Encounter for blood-alcohol and blood-drug test: Secondary | ICD-10-CM

## 2018-06-14 NOTE — Patient Instructions (Signed)

## 2018-06-14 NOTE — Progress Notes (Addendum)
   PRENATAL VISIT NOTE  Subjective:  Cassandra Vazquez is a 33 y.o. G4P3003 at [redacted]w[redacted]d being seen today for ongoing prenatal care.  She is currently monitored for the following issues for this high-risk pregnancy and has Supervision of high risk pregnancy, antepartum; BMI 45.0-49.9, adult (HCC); History of gestational diabetes in prior pregnancy, currently pregnant; Depression, recurrent (HCC); and Vitamin D deficiency on their problem list.  Patient reports no complaints. No additional syncopal episodes since ED visit 05/06/2018.  Contractions: Not present. Vag. Bleeding: None.  Movement: Present. Denies leaking of fluid.   The following portions of the patient's history were reviewed and updated as appropriate: allergies, current medications, past family history, past medical history, past social history, past surgical history and problem list. Problem list updated.  Objective:   Vitals:   06/14/18 0823  BP: 118/72  Pulse: 92  Weight: 263 lb 3.2 oz (119.4 kg)    Fetal Status: Fetal Heart Rate (bpm): 142 Fundal Height: 30 cm Movement: Present     General:  Alert, oriented and cooperative. Patient is in no acute distress.  Skin: Skin is warm and dry. No rash noted.   Cardiovascular: Normal heart rate noted  Respiratory: Normal respiratory effort, no problems with respiration noted  Abdomen: Soft, gravid, appropriate for gestational age.  Pain/Pressure: Absent     Pelvic: Cervical exam deferred        Extremities: Normal range of motion.  Edema: None  Mental Status: Normal mood and affect. Normal behavior. Normal judgment and thought content.   Assessment and Plan:  Pregnancy: G4P3003 at [redacted]w[redacted]d  1. Encounter for supervision of other normal pregnancy in third trimester - Continue routine care - HIV Antibody (routine testing w rflx) - RPR - CBC - Glucose Tolerance, 2 Hours w/1 Hour  2. BMI 45.0-49.9, adult (HCC) - 3lb weight gain from previous visit, fundal height appropriate  3.  History of gestational diabetes in prior pregnancy, currently pregnant - Compliant with daily Aspirin 81mg  as previously advised  Preterm labor symptoms and general obstetric precautions including but not limited to vaginal bleeding, contractions, leaking of fluid and fetal movement were reviewed in detail with the patient. Please refer to After Visit Summary for other counseling recommendations.  Return in about 2 weeks (around 06/28/2018).  Future Appointments  Date Time Provider Department Center  06/29/2018  3:30 PM Anyanwu, Jethro Bastos, MD [facility]    [place], [place]

## 2018-06-15 LAB — CBC
HEMOGLOBIN: 10.7 g/dL — AB (ref 11.1–15.9)
Hematocrit: 31.9 % — ABNORMAL LOW (ref 34.0–46.6)
MCH: 27.2 pg (ref 26.6–33.0)
MCHC: 33.5 g/dL (ref 31.5–35.7)
MCV: 81 fL (ref 79–97)
Platelets: 334 10*3/uL (ref 150–450)
RBC: 3.94 x10E6/uL (ref 3.77–5.28)
RDW: 13.2 % (ref 11.7–15.4)
WBC: 11.4 10*3/uL — ABNORMAL HIGH (ref 3.4–10.8)

## 2018-06-15 LAB — RPR: RPR: NONREACTIVE

## 2018-06-15 LAB — GLUCOSE TOLERANCE, 2 HOURS W/ 1HR
Glucose, 1 hour: 174 mg/dL (ref 65–179)
Glucose, 2 hour: 141 mg/dL (ref 65–152)
Glucose, Fasting: 82 mg/dL (ref 65–91)

## 2018-06-15 LAB — HIV ANTIBODY (ROUTINE TESTING W REFLEX): HIV Screen 4th Generation wRfx: NONREACTIVE

## 2018-06-29 ENCOUNTER — Encounter: Payer: Self-pay | Admitting: Obstetrics & Gynecology

## 2018-06-29 ENCOUNTER — Encounter: Payer: BC Managed Care – PPO | Admitting: Obstetrics & Gynecology

## 2018-06-29 ENCOUNTER — Ambulatory Visit (INDEPENDENT_AMBULATORY_CARE_PROVIDER_SITE_OTHER): Payer: BC Managed Care – PPO | Admitting: Obstetrics & Gynecology

## 2018-06-29 ENCOUNTER — Other Ambulatory Visit: Payer: Self-pay

## 2018-06-29 DIAGNOSIS — Z3483 Encounter for supervision of other normal pregnancy, third trimester: Secondary | ICD-10-CM

## 2018-06-29 DIAGNOSIS — O0990 Supervision of high risk pregnancy, unspecified, unspecified trimester: Secondary | ICD-10-CM

## 2018-06-29 DIAGNOSIS — Z3A30 30 weeks gestation of pregnancy: Secondary | ICD-10-CM

## 2018-06-29 DIAGNOSIS — O0993 Supervision of high risk pregnancy, unspecified, third trimester: Secondary | ICD-10-CM

## 2018-06-29 DIAGNOSIS — Z8632 Personal history of gestational diabetes: Secondary | ICD-10-CM

## 2018-06-29 DIAGNOSIS — IMO0002 Reserved for concepts with insufficient information to code with codable children: Secondary | ICD-10-CM

## 2018-06-29 DIAGNOSIS — Z0489 Encounter for examination and observation for other specified reasons: Secondary | ICD-10-CM

## 2018-06-29 NOTE — Progress Notes (Signed)
   TELEHEALTH VIRTUAL OBSTETRICS VISIT ENCOUNTER NOTE  I connected with Cassandra Vazquez on 06/29/18 at  9:45 AM EDT by telephone at home and verified that I am speaking with the correct person using two identifiers.   I discussed the limitations, risks, security and privacy concerns of performing an evaluation and management service by telephone and the availability of in person appointments. I also discussed with the patient that there may be a patient responsible charge related to this service. The patient expressed understanding and agreed to proceed.  Subjective:  Cassandra Vazquez is a 33 y.o. G4P3003 at [redacted]w[redacted]d being followed for ongoing prenatal care.  She is currently monitored for the following issues for this high-risk pregnancy and has Supervision of high risk pregnancy, antepartum; BMI 45.0-49.9, adult (HCC); History of gestational diabetes in prior pregnancy, currently pregnant; Depression, recurrent (HCC); and Vitamin D deficiency on their problem list.  Patient reports no complaints. Reports fetal movement. Denies any contractions, bleeding or leaking of fluid.   The following portions of the patient's history were reviewed and updated as appropriate: allergies, current medications, past family history, past medical history, past social history, past surgical history and problem list.   Objective:   General:  Alert, oriented and cooperative.   Mental Status: Normal mood and affect perceived. Normal judgment and thought content.  Rest of physical exam deferred due to type of encounter  Assessment and Plan:  Pregnancy: G4P3003 at [redacted]w[redacted]d 1. Evaluate anatomy not seen on prior sonogram Suboptimal views of heart and spine, has not been followed up yet.  - [place] [facility] OB FOLLOW UP; Future  2. History of gestational diabetes Normal 2 hr GTT. On ASA 81 mg daily.   3. Encounter for supervision of other normal pregnancy in third trimester Will come in to pick up BP cuff soon,  this will help to  facilitate virtual obstetric visits during this COVID19 pandemic.  Patient will be instructed to check her BP at least once per week, and to send values via MyChart or have them ready for review during virtual visit.  Preterm labor symptoms and general obstetric precautions including but not limited to vaginal bleeding, contractions, leaking of fluid and fetal movement were reviewed in detail with the patient.  I discussed the assessment and treatment plan with the patient. The patient was provided an opportunity to ask questions and all were answered. The patient agreed with the plan and demonstrated an understanding of the instructions. The patient was advised to call back or seek an in-person office evaluation/go to MAU at [facility] for any urgent or concerning symptoms. Please refer to After Visit Summary for other counseling recommendations.   I provided 17 minutes of non-face-to-face time during this encounter.  Return in about 3 weeks (around 07/20/2018) for Virtual OB Visit  6 weeks: OFFICE OB Visit, TDap, Pelvic cultures.  Future Appointments  Date Time Provider Department Center  07/20/2018 11:15 AM Becci Batty, Jethro Bastos, MD [facility]  08/10/2018  3:30 PM Quanesha Klimaszewski, Jethro Bastos, MD [facility]    Jaynie Collins, MD Center for [facility], [facility]

## 2018-06-29 NOTE — Patient Instructions (Signed)
Return to office for any scheduled appointments. Call the office or go to the MA[facility] at [facility] [place] if:  You begin to have strong, frequent contractions  Your water breaks.  Sometimes it is a big gush of fluid, sometimes it is just a trickle that keeps getting your panties wet or running down your legs  You have vaginal bleeding.  It is normal to have a small amount of spotting if your cervix was checked.   You do not feel your baby moving like normal.  If you do not, get something to eat and drink and lay down and focus on feeling your baby move.   If your baby is still not moving like normal, you should call the office or go to MAU.  Any other obstetric concerns.  TDaP Vaccine Pregnancy Get the Whooping Cough Vaccine While You Are Pregnant (CDC)  It is important for women to get the whooping cough vaccine in the third trimester of each pregnancy. Vaccines are the best way to prevent this disease. There are 2 different whooping cough vaccines. Both vaccines combine protection against whooping cough, tetanus and diphtheria, but they are for different age groups: Tdap: for everyone 11 years or older, including pregnant women  DTaP: for children 2 months through 6 years of age  You need the whooping cough vaccine during each of your pregnancies The recommended time to get the shot is during your 27th through 36th week of pregnancy, preferably during the earlier part of this time period. The Centers for Disease Control and Prevention (CDC) recommends that pregnant women receive the whooping cough vaccine for adolescents and adults (called Tdap vaccine) during the third trimester of each pregnancy. The recommended time to get the shot is during your 27th through 36th week of pregnancy, preferably during the earlier part of this time period. This replaces the original recommendation that pregnant women get the vaccine only if they had not previously received it. The  American College of Obstetricians and Gynecologists and the American College of Nurse-Midwives support this recommendation.  You should get the whooping cough vaccine while pregnant to pass protection to your baby frame support disabled and/or not supported in this browser  Learn why Laura decided to get the whooping cough vaccine in her 3rd trimester of pregnancy and how her baby girl was born with some protection against the disease. Also available on YouTube. After receiving the whooping cough vaccine, your body will create protective antibodies (proteins produced by the body to fight off diseases) and pass some of them to your baby before birth. These antibodies provide your baby some short-term protection against whooping cough in early life. These antibodies can also protect your baby from some of the more serious complications that come along with whooping cough. Your protective antibodies are at their highest about 2 weeks after getting the vaccine, but it takes time to pass them to your baby. So the preferred time to get the whooping cough vaccine is early in your third trimester. The amount of whooping cough antibodies in your body decreases over time. That is why CDC recommends you get a whooping cough vaccine during each pregnancy. Doing so allows each of your babies to get the greatest number of protective antibodies from you. This means each of your babies will get the best protection possible against this disease.  Getting the whooping cough vaccine while pregnant is better than getting the vaccine after you give birth Whooping cough vaccination during   pregnancy is ideal so your baby will have short-term protection as soon as he is born. This early protection is important because your baby will not start getting his whooping cough vaccines until he is 2 months old. These first few months of life are when your baby is at greatest risk for catching whooping cough. This is also when he's at  greatest risk for having severe, potentially life-threating complications from the infection. To avoid that gap in protection, it is best to get a whooping cough vaccine during pregnancy. You will then pass protection to your baby before he is born. To continue protecting your baby, he should get whooping cough vaccines starting at 2 months old. You may never have gotten the Tdap vaccine before and did not get it during this pregnancy. If so, you should make sure to get the vaccine immediately after you give birth, before leaving the hospital or birthing center. It will take about 2 weeks before your body develops protection (antibodies) in response to the vaccine. Once you have protection from the vaccine, you are less likely to give whooping cough to your newborn while caring for him. But remember, your baby will still be at risk for catching whooping cough from others. A recent study looked to see how effective Tdap was at preventing whooping cough in babies whose mothers got the vaccine while pregnant or in the hospital after giving birth. The study found that getting Tdap between 27 through 36 weeks of pregnancy is 85% more effective at preventing whooping cough in babies younger than 2 months old. Blood tests cannot tell if you need a whooping cough vaccine There are no blood tests that can tell you if you have enough antibodies in your body to protect yourself or your baby against whooping cough. Even if you have been sick with whooping cough in the past or previously received the vaccine, you still should get the vaccine during each pregnancy. Breastfeeding may pass some protective antibodies onto your baby By breastfeeding, you may pass some antibodies you have made in response to the vaccine to your baby. When you get a whooping cough vaccine during your pregnancy, you will have antibodies in your breast milk that you can share with your baby as soon as your milk comes in. However, your baby will not  get protective antibodies immediately if you wait to get the whooping cough vaccine until after delivering your baby. This is because it takes about 2 weeks for your body to create antibodies. Learn more about the health benefits of breastfeeding.  

## 2018-07-13 ENCOUNTER — Inpatient Hospital Stay (HOSPITAL_COMMUNITY): Admission: RE | Admit: 2018-07-13 | Payer: BC Managed Care – PPO | Source: Ambulatory Visit

## 2018-07-17 ENCOUNTER — Encounter: Payer: Self-pay | Admitting: *Deleted

## 2018-07-20 ENCOUNTER — Other Ambulatory Visit: Payer: Self-pay

## 2018-07-20 ENCOUNTER — Ambulatory Visit (HOSPITAL_COMMUNITY)
Admission: RE | Admit: 2018-07-20 | Discharge: 2018-07-20 | Disposition: A | Discharge status: Home / Self Care | Payer: BC Managed Care – PPO | Source: Ambulatory Visit | Attending: Obstetrics and Gynecology | Admitting: Obstetrics and Gynecology

## 2018-07-20 ENCOUNTER — Ambulatory Visit (INDEPENDENT_AMBULATORY_CARE_PROVIDER_SITE_OTHER): Payer: BC Managed Care – PPO | Admitting: Obstetrics & Gynecology

## 2018-07-20 DIAGNOSIS — O99213 Obesity complicating pregnancy, third trimester: Secondary | ICD-10-CM

## 2018-07-20 DIAGNOSIS — O0990 Supervision of high risk pregnancy, unspecified, unspecified trimester: Secondary | ICD-10-CM

## 2018-07-20 DIAGNOSIS — Z362 Encounter for other antenatal screening follow-up: Secondary | ICD-10-CM | POA: Diagnosis not present

## 2018-07-20 DIAGNOSIS — Z3A33 33 weeks gestation of pregnancy: Secondary | ICD-10-CM

## 2018-07-20 DIAGNOSIS — Z0489 Encounter for examination and observation for other specified reasons: Secondary | ICD-10-CM | POA: Diagnosis not present

## 2018-07-20 DIAGNOSIS — O0993 Supervision of high risk pregnancy, unspecified, third trimester: Secondary | ICD-10-CM

## 2018-07-20 DIAGNOSIS — O09293 Supervision of pregnancy with other poor reproductive or obstetric history, third trimester: Secondary | ICD-10-CM

## 2018-07-20 DIAGNOSIS — IMO0002 Reserved for concepts with insufficient information to code with codable children: Secondary | ICD-10-CM

## 2018-07-20 NOTE — Progress Notes (Signed)
   TELEHEALTH [facility] OBSTETRICS VISIT ENCOUNTER NOTE  I connected with@ on 07/20/18 at 11:15 AM EDT via WebEx at home and verified that I am speaking with the correct person using two identifiers.   I discussed the limitations, risks, security and privacy concerns of performing an evaluation and management service by telephone and the availability of in person appointments. I also discussed with the patient that there may be a patient responsible charge related to this service. The patient expressed understanding and agreed to proceed.  Subjective:  Cassandra Vazquez is a 33 y.o. G4P3003 at [redacted]w[redacted]d being followed for ongoing prenatal care.  She is currently monitored for the following issues for this high-risk pregnancy and has Supervision of high risk pregnancy, antepartum; BMI 45.0-49.9, adult (HCC); History of gestational diabetes in prior pregnancy, currently pregnant; Depression, recurrent (HCC); and Vitamin D deficiency on their problem list.  Patient reports no complaints. Reports fetal movement. Denies any contractions, bleeding or leaking of fluid.   The following portions of the patient's history were reviewed and updated as appropriate: allergies, current medications, past family history, past medical history, past social history, past surgical history and problem list.   Objective:  LMP 11/29/2017 (Exact Date)   General:  Alert, oriented and cooperative. Patient is in no acute distress.  Mental Status: Normal mood and affect. Normal behavior. Normal judgment and thought content.   Respiratory: Normal respiratory effort noted, no problems with respiration noted  Rest of physical exam deferred due to type of encounter  Assessment and Plan:  Pregnancy: G4P3003 at [redacted]w[redacted]d 1. Supervision of high risk pregnancy, antepartum Follow up anatomy scan at MFM today, will follow up results and manage accordingly. Advised to check BP weekly, she will come pick up cuff soon or go get her mother's cuff.  Preterm labor symptoms and general obstetric precautions including but not limited to vaginal bleeding, contractions, leaking of fluid and fetal movement were reviewed in detail with the patient.  I discussed the assessment and treatment plan with the patient. The patient was provided an opportunity to ask questions and all were answered. The patient agreed with the plan and demonstrated an understanding of the instructions. The patient was advised to call back or seek an in-person office evaluation/go to MAU at [facility] for any urgent or concerning symptoms. Please refer to After Visit Summary for other counseling recommendations.   I provided 6 minutes of face-to-face time via WebEx during this encounter.  Return in about 3 weeks (around 08/10/2018) for Pelvic cultures, Tdap, OFFICE OB Visit.  Future Appointments  Date Time Provider Department Center  07/20/2018 12:45 PM [facility] [place] 2 [facility]  08/10/2018  3:30 PM Sanayah Munro, Jethro Bastos, MD [facility]    Jaynie Collins, MD Center for [employer] [facility], [facility]

## 2018-07-20 NOTE — Progress Notes (Signed)
Patient reports not taking her blood pressure.

## 2018-07-20 NOTE — Patient Instructions (Signed)
Return to office for any scheduled appointments. Call the office or go to the MAU at [facility] [place] if:  You begin to have strong, frequent contractions  Your water breaks.  Sometimes it is a big gush of fluid, sometimes it is just a trickle that keeps getting your panties wet or running down your legs  You have vaginal bleeding.  It is normal to have a small amount of spotting if your cervix was checked.   You do not feel your baby moving like normal.  If you do not, get something to eat and drink and lay down and focus on feeling your baby move.   If your baby is still not moving like normal, you should call the office or go to MAU.  Any other obstetric concerns.   

## 2018-07-21 ENCOUNTER — Telehealth: Payer: Self-pay | Admitting: *Deleted

## 2018-07-21 NOTE — Telephone Encounter (Signed)
Pt called asking where she should go if she need to be evaluated over the weekend. Pt has been having cramping and contractions and has lost some mucus. Denies any vaginal bleeding or leaking any fluid. Instructed patient that if she does continue to feel more contractions and cramping any vaginal bleeding or leaking of fluid to go to [facility] maternity assessment unit. Visitor policy also explained to patient. Pt verbalizes and understands.

## 2018-07-31 ENCOUNTER — Ambulatory Visit: Payer: BC Managed Care – PPO

## 2018-08-01 ENCOUNTER — Other Ambulatory Visit: Payer: Self-pay

## 2018-08-01 ENCOUNTER — Ambulatory Visit (INDEPENDENT_AMBULATORY_CARE_PROVIDER_SITE_OTHER): Payer: BC Managed Care – PPO | Admitting: *Deleted

## 2018-08-01 VITALS — BP 120/80 | HR 88

## 2018-08-01 DIAGNOSIS — O0990 Supervision of high risk pregnancy, unspecified, unspecified trimester: Secondary | ICD-10-CM

## 2018-08-01 LAB — POCT URINALYSIS DIPSTICK: Blood, UA: NEGATIVE

## 2018-08-01 NOTE — Progress Notes (Signed)
SUBJECTIVE: Cassandra Vazquez is a 33 y.o. female who complains of low back pain and some cramping. Denies any urinary frequency, urgency and dysuria,fever, chills, or abnormal vaginal discharge or bleeding. Pt unsure if its a UTI or just pregnancy.   OBJECTIVE: Appears well, in no apparent distress.  Vital signs are normal. Urine dipstick shows positive for leukocytes.      PLAN: Will send urine for culture and treat per protocol.  Call or return to clinic prn if these symptoms worsen or fail to improve as anticipated.

## 2018-08-03 LAB — CULTURE, OB URINE

## 2018-08-03 LAB — URINE CULTURE, OB REFLEX

## 2018-08-07 NOTE — Progress Notes (Signed)
I have reviewed the chart and agree with nursing staff's documentation of this patient's encounter.  Hulen Mandler, MD 08/07/2018 11:34 AM    

## 2018-08-10 ENCOUNTER — Other Ambulatory Visit (HOSPITAL_COMMUNITY)
Admission: RE | Admit: 2018-08-10 | Discharge: 2018-08-10 | Disposition: A | Discharge status: Home / Self Care | Payer: BC Managed Care – PPO | Source: Ambulatory Visit | Attending: Obstetrics & Gynecology | Admitting: Obstetrics & Gynecology

## 2018-08-10 ENCOUNTER — Ambulatory Visit (INDEPENDENT_AMBULATORY_CARE_PROVIDER_SITE_OTHER): Payer: BC Managed Care – PPO | Admitting: Obstetrics & Gynecology

## 2018-08-10 ENCOUNTER — Encounter: Payer: Self-pay | Admitting: Obstetrics & Gynecology

## 2018-08-10 ENCOUNTER — Other Ambulatory Visit: Payer: Self-pay

## 2018-08-10 VITALS — BP 135/87 | HR 92 | Wt 268.1 lb

## 2018-08-10 DIAGNOSIS — O0990 Supervision of high risk pregnancy, unspecified, unspecified trimester: Secondary | ICD-10-CM | POA: Diagnosis not present

## 2018-08-10 DIAGNOSIS — O0993 Supervision of high risk pregnancy, unspecified, third trimester: Secondary | ICD-10-CM

## 2018-08-10 DIAGNOSIS — Z3A36 36 weeks gestation of pregnancy: Secondary | ICD-10-CM

## 2018-08-10 NOTE — Progress Notes (Signed)
   PRENATAL VISIT NOTE  Subjective:  Cassandra Vazquez is a 33 y.o. G4P3003 at [redacted]w[redacted]d being seen today for ongoing prenatal care.  She is currently monitored for the following issues for this high-risk pregnancy and has Supervision of high risk pregnancy, antepartum; BMI 45.0-49.9, adult (HCC); History of gestational diabetes in prior pregnancy, currently pregnant; Depression, recurrent (HCC); and Vitamin D deficiency on their problem list.  Patient reports occasional contractions.  Contractions: Not present. Vag. Bleeding: None.  Movement: Present. Denies leaking of fluid.   The following portions of the patient's history were reviewed and updated as appropriate: allergies, current medications, past family history, past medical history, past social history, past surgical history and problem list.   Objective:   Vitals:   08/10/18 1543  BP: 135/87  Pulse: 92  Weight: 268 lb 1.6 oz (121.6 kg)    Fetal Status: Fetal Heart Rate (bpm): 146   Movement: Present  Presentation: Vertex  General:  Alert, oriented and cooperative. Patient is in no acute distress.  Skin: Skin is warm and dry. No rash noted.   Cardiovascular: Normal heart rate noted  Respiratory: Normal respiratory effort, no problems with respiration noted  Abdomen: Soft, gravid, appropriate for gestational age.  Pain/Pressure: Absent     Pelvic: Cervical exam performed Dilation: 3.5 Effacement (%): 60 Station: -3  Extremities: Normal range of motion.  Edema: None  Mental Status: Normal mood and affect. Normal behavior. Normal judgment and thought content.   Assessment and Plan:  Pregnancy: G4P3003 at [redacted]w[redacted]d 1. Supervision of high risk pregnancy, antepartum Pelvic cultures done today - GC/Chlamydia probe amp ([place])not at [facility] - Culture, beta strep (group b only) Preterm labor symptoms and general obstetric precautions including but not limited to vaginal bleeding, contractions, leaking of fluid and fetal movement were reviewed  in detail with the patient. Please refer to After Visit Summary for other counseling recommendations.   Return in about 1 week (around 08/17/2018) for Virtual OB Visit  2 weeks: OFFICE OB Visit.  Future Appointments  Date Time Provider Department Center  08/17/2018 11:30 AM , Jethro Bastos, MD [facility]  08/23/2018 11:00 AM Federico Flake, MD [facility]    Jaynie Collins, MD

## 2018-08-10 NOTE — Patient Instructions (Signed)
Return to office for any scheduled appointments. Call the office or go to the [facility]  if:  You begin to have strong, frequent contractions  Your water breaks.  Sometimes it is a big gush of fluid, sometimes it is just a trickle that keeps getting your panties wet or running down your legs  You have vaginal bleeding.  It is normal to have a small amount of spotting if your cervix was checked.   You do not feel your baby moving like normal.  If you do not, get something to eat and drink and lay down and focus on feeling your baby move.   If your baby is still not moving like normal, you should call the office or go to MAU.  Any other obstetric concerns.   

## 2018-08-14 LAB — GC/CHLAMYDIA PROBE AMP (~~LOC~~) NOT AT ARMC
Chlamydia: NEGATIVE
Neisseria Gonorrhea: NEGATIVE

## 2018-08-14 LAB — CULTURE, BETA STREP (GROUP B ONLY): Strep Gp B Culture: NEGATIVE

## 2018-08-17 ENCOUNTER — Telehealth (INDEPENDENT_AMBULATORY_CARE_PROVIDER_SITE_OTHER): Payer: BC Managed Care – PPO | Admitting: Obstetrics & Gynecology

## 2018-08-17 ENCOUNTER — Other Ambulatory Visit: Payer: Self-pay

## 2018-08-17 ENCOUNTER — Encounter: Payer: Self-pay | Admitting: Obstetrics & Gynecology

## 2018-08-17 ENCOUNTER — Encounter: Payer: BC Managed Care – PPO | Admitting: Obstetrics & Gynecology

## 2018-08-17 VITALS — BP 118/85

## 2018-08-17 DIAGNOSIS — O0993 Supervision of high risk pregnancy, unspecified, third trimester: Secondary | ICD-10-CM

## 2018-08-17 DIAGNOSIS — Z3A37 37 weeks gestation of pregnancy: Secondary | ICD-10-CM

## 2018-08-17 DIAGNOSIS — O0990 Supervision of high risk pregnancy, unspecified, unspecified trimester: Secondary | ICD-10-CM

## 2018-08-17 NOTE — Progress Notes (Signed)
TELEHEALTH OBSTETRICS PRENATAL VIRTUAL VIDEO VISIT ENCOUNTER NOTE  [facility]   I connected with Cassandra BrightAshley Gramlich on 08/17/18 at 11:30 AM EDT by MyChart Video Encounter at home and verified that I am speaking with the correct person using two identifiers.   I discussed the limitations, risks, security and privacy concerns of performing an evaluation and management service by telephone and the availability of in person appointments. I also discussed with the patient that there may be a patient responsible charge related to this service. The patient expressed understanding and agreed to proceed. Subjective:  Cassandra Vazquez is a 33 y.o. G4P3003 at 2167w2d being seen today for ongoing prenatal care.  She is currently monitored for the following issues for this high-risk pregnancy and has Supervision of high risk pregnancy, antepartum; BMI 45.0-49.9, adult (HCC); History of gestational diabetes in prior pregnancy, currently pregnant; Depression, recurrent (HCC); and Vitamin D deficiency on their problem list.  Patient reports no complaints.  Contractions: Irregular. Vag. Bleeding: None.  Movement: Present. Denies any leaking of fluid.   The following portions of the patient's history were reviewed and updated as appropriate: allergies, current medications, past family history, past medical history, past social history, past surgical history and problem list.   Objective:   Vitals:   08/17/18 1126  BP: 118/85    Fetal Status:     Movement: Present     General:  Alert, oriented and cooperative. Patient is in no acute distress.  Respiratory: Normal respiratory effort, no problems with respiration noted  Mental Status: Normal mood and affect. Normal behavior. Normal judgment and thought content.  Rest of physical exam deferred due to type of encounter  Imaging: [place]as Mfm Ob Follow Up  Result Date: 07/21/2018  ----------------------------------------------------------------------  OBSTETRICS REPORT                       (Signed Final 07/21/2018 11:30 am) ---------------------------------------------------------------------- Patient Info  ID #:       [id number]                          D.O.B.:  May 26, 1985 (33 yrs)  Name:       Cassandra Vazquez                     Visit Date: 07/20/2018 12:40 pm ---------------------------------------------------------------------- Performed By  Performed By:     Hurman HornAlyssa Keatts          Secondary Phy.:   Jethro BastosUGONNA A                    RDMS                                                             Macon LargeANYANWU MD  Attending:        Lin Landsmanorenthian Booker      Address:          [address] [place]                    MD  [address]                                                             [place], [place]                                                             [address]  Referred By:      Levonne Lapping       [facility]  Ref. Address:     [address]                    [address] ---------------------------------------------------------------------- Orders   #  Description                          Code         Ordered By   1  [place] MFM OB FOLLOW UP                  [phone]     Jaynie Collins  ----------------------------------------------------------------------   #  Order #                    Accession #                 Episode #   1  [id number]                  [id number]                  [id number]  ---------------------------------------------------------------------- Indications   Poor obstetric history: Previous gestational   O09.299   diabetes   Obesity complicating pregnancy, third          O99.213   trimester   Encounter for other antenatal screening        Z36.2   follow-up   [redacted] weeks gestation of pregnancy                Z3A.33   ---------------------------------------------------------------------- Vital Signs                                                 Height:        5'4" ---------------------------------------------------------------------- Fetal Evaluation  Num Of Fetuses:         1  Fetal Heart Rate(bpm):  148  Cardiac Activity:       Observed  Amniotic Fluid  AFI FV:      Within normal limits  AFI Sum(cm)     %  Tile       Largest Pocket(cm)  12.02           33          5.49  RUQ(cm)       RLQ(cm)       LUQ(cm)        LLQ(cm)  1.98          2.44          2.11           5.49 ---------------------------------------------------------------------- Biometry  BPD:      86.5  mm     G. Age:  34w 6d         87  %    CI:        72.94   %    70 - 86                                                          FL/HC:      21.0   %    19.9 - 21.5  HC:       322   mm     G. Age:  36w 3d         87  %    HC/AC:      1.16        0.96 - 1.11  AC:      277.5  mm     G. Age:  31w 6d         14  %    FL/BPD:     78.0   %    71 - 87  FL:       67.5  mm     G. Age:  34w 5d         76  %    FL/AC:      24.3   %    20 - 24  Est. FW:    2189  gm    4 lb 13 oz      61  % ---------------------------------------------------------------------- OB History  Gravidity:    4         Term:   3  Living:       3 ---------------------------------------------------------------------- Gestational Age  LMP:           33w 2d        Date:  11/29/17                 EDD:   09/05/18  U/S Today:     34w 3d                                        EDD:   08/28/18  Best:          33w 2d     Det. By:  LMP  (11/29/17)          EDD:   09/05/18 ---------------------------------------------------------------------- Anatomy  Cranium:               Appears normal         Aortic Arch:            Not well visualized  Cavum:  Appears normal         Ductal Arch:            Appears normal  Ventricles:            Previously seen        Diaphragm:              Appears normal  Choroid  Plexus:        Previously seen        Stomach:                Appears normal, left                                                                        sided  Cerebellum:            Previously seen        Abdomen:                Appears normal  Posterior Fossa:       Previously seen        Abdominal Wall:         Appears nml (cord                                                                        insert, abd wall)  Nuchal Fold:           Previously seen        Cord Vessels:           Appears normal (3                                                                        vessel cord)  Face:                  Appears normal         Kidneys:                Appear normal                         (orbits and profile)  Lips:                  Previously seen        Bladder:                Appears normal  Thoracic:              Appears normal         Spine:                  Not well visualized  Heart:  Appears normal         Upper Extremities:      Previously seen                         (4CH, axis, and                         situs)  RVOT:                  Appears normal         Lower Extremities:      Previously seen  LVOT:                  Appears normal  Other:  Female gender Technically difficult due to maternal habitus and fetal          position. ---------------------------------------------------------------------- Impression  Normal interval growth.  Suboptimal views of the fetal spine ---------------------------------------------------------------------- Recommendations  Follow up growth anatomy in 4 weeks. ----------------------------------------------------------------------               Lin Landsman, MD Electronically Signed Final Report   07/21/2018 11:30 am ----------------------------------------------------------------------   Assessment and Plan:  Pregnancy: [id number] at [redacted]w[redacted]d 1. Supervision of high risk pregnancy, antepartum Term labor symptoms and general obstetric  precautions including but not limited to vaginal bleeding, contractions, leaking of fluid and fetal movement were reviewed in detail with the patient.  GBS neg.  I discussed the assessment and treatment plan with the patient. The patient was provided an opportunity to ask questions and all were answered. The patient agreed with the plan and demonstrated an understanding of the instructions. The patient was advised to call back or seek an in-person office evaluation/go to MAU at [facility] for any urgent or concerning symptoms. Please refer to After Visit Summary for other counseling recommendations.   Return in about 1 week (around 08/24/2018) for Virtual OB Visit.  Future Appointments  Date Time Provider Department Center  08/23/2018 11:00 AM Federico Flake, MD [facility]    Jaynie Collins, MD [facility]

## 2018-08-17 NOTE — Patient Instructions (Signed)
Return to office for any scheduled appointments. Call the office or go to the [facility] at [facility] if:  You begin to have strong, frequent contractions  Your water breaks.  Sometimes it is a big gush of fluid, sometimes it is just a trickle that keeps getting your panties wet or running down your legs  You have vaginal bleeding.  It is normal to have a small amount of spotting if your cervix was checked.   You do not feel your baby moving like normal.  If you do not, get something to eat and drink and lay down and focus on feeling your baby move.   If your baby is still not moving like normal, you should call the office or go to MAU.  Any other obstetric concerns.   

## 2018-08-23 ENCOUNTER — Encounter: Payer: BC Managed Care – PPO | Admitting: Family Medicine

## 2018-08-23 ENCOUNTER — Telehealth (INDEPENDENT_AMBULATORY_CARE_PROVIDER_SITE_OTHER): Payer: BC Managed Care – PPO | Admitting: Family Medicine

## 2018-08-23 ENCOUNTER — Telehealth: Payer: BC Managed Care – PPO

## 2018-08-23 VITALS — BP 123/78 | HR 78 | Wt 256.6 lb

## 2018-08-23 DIAGNOSIS — Z8632 Personal history of gestational diabetes: Secondary | ICD-10-CM

## 2018-08-23 DIAGNOSIS — O0990 Supervision of high risk pregnancy, unspecified, unspecified trimester: Secondary | ICD-10-CM

## 2018-08-23 DIAGNOSIS — E559 Vitamin D deficiency, unspecified: Secondary | ICD-10-CM

## 2018-08-23 DIAGNOSIS — O09299 Supervision of pregnancy with other poor reproductive or obstetric history, unspecified trimester: Secondary | ICD-10-CM

## 2018-08-23 DIAGNOSIS — F339 Major depressive disorder, recurrent, unspecified: Secondary | ICD-10-CM

## 2018-08-23 DIAGNOSIS — Z6841 Body Mass Index (BMI) 40.0 and over, adult: Secondary | ICD-10-CM

## 2018-08-23 DIAGNOSIS — O99343 Other mental disorders complicating pregnancy, third trimester: Secondary | ICD-10-CM

## 2018-08-23 NOTE — Progress Notes (Signed)
I connected with@ on 08/23/18 at 11:00 AM EDT by: MyChart and verified that I am speaking with the correct person using two identifiers.  Patient is located at home and provider is located at [facility].     The purpose of this virtual visit is to provide medical care while limiting exposure to the novel coronavirus. I discussed the limitations, risks, security and privacy concerns of performing an evaluation and management service by virtual visit and the availability of in person appointments. I also discussed with the patient that there may be a patient responsible charge related to this service. By engaging in this virtual visit, you consent to the provision of healthcare.  Additionally, you authorize for your insurance to be billed for the services provided during this visit.  The patient expressed understanding and agreed to proceed.  The following staff members participated in the virtual visit:  Scheryl Marten    PRENATAL VISIT NOTE  Subjective:  Cassandra Vazquez is a 33 y.o. [id number] at [redacted]w[redacted]d  for phone visit for ongoing prenatal care.  She is currently monitored for the following issues for this high-risk pregnancy and has Supervision of high risk pregnancy, antepartum; BMI 45.0-49.9, adult (HCC); History of gestational diabetes in prior pregnancy, currently pregnant; Depression, recurrent (HCC); and Vitamin D deficiency on their problem list.  Patient reports no complaints.  Contractions: Irregular. Vag. Bleeding: None.  Movement: Present. Denies leaking of fluid.   The following portions of the patient's history were reviewed and updated as appropriate: allergies, current medications, past family history, past medical history, past social history, past surgical history and problem list.   Objective:   Vitals:   08/23/18 1133  BP: 123/78  Pulse: 78  Weight: 256 lb 9.6 oz (116.4 kg)   Self-Obtained  Fetal Status:     Movement: Present     Assessment and Plan:  Pregnancy: G4P3003  at [redacted]w[redacted]d 1. Vitamin D deficiency Follow up pp  2. Supervision of high risk pregnancy, antepartum Up to date Reviewed next visit in person  3. BMI 45.0-49.9, adult (HCC)  TWG=-3 lb 6.4 oz (-1.542 kg)   4. History of gestational diabetes in prior pregnancy, currently pregnant No GDM this pregnancy  5. Depression, recurrent (HCC) Mood appropriate today   Term labor symptoms and general obstetric precautions including but not limited to vaginal bleeding, contractions, leaking of fluid and fetal movement were reviewed in detail with the patient.  Return in about 1 week (around 08/30/2018) for Routine prenatal care.  No future appointments.   Time spent on virtual visit: 15 minutes  Federico Flake, MD

## 2018-08-29 ENCOUNTER — Ambulatory Visit (INDEPENDENT_AMBULATORY_CARE_PROVIDER_SITE_OTHER): Payer: BC Managed Care – PPO | Admitting: Obstetrics and Gynecology

## 2018-08-29 ENCOUNTER — Other Ambulatory Visit: Payer: Self-pay

## 2018-08-29 VITALS — BP 116/79 | HR 76 | Wt 265.4 lb

## 2018-08-29 DIAGNOSIS — Z3493 Encounter for supervision of normal pregnancy, unspecified, third trimester: Secondary | ICD-10-CM

## 2018-08-29 DIAGNOSIS — Z3A39 39 weeks gestation of pregnancy: Secondary | ICD-10-CM

## 2018-08-29 NOTE — Progress Notes (Signed)
Prenatal Visit Note Date: 08/29/2018 [facility]  Subjective:  Cassandra Vazquez is a 33 y.o. [phone] at [redacted]w[redacted]d being seen today for ongoing prenatal care.  She is currently monitored for the following issues for this low-risk pregnancy and has Supervision of high risk pregnancy, antepartum; BMI 45.0-49.9, adult (HCC); History of gestational diabetes in prior pregnancy, currently pregnant; Depression, recurrent (HCC); and Vitamin D deficiency on their problem list.  Patient reports no complaints.   Contractions: Not present. Vag. Bleeding: None.  Movement: Present. Denies leaking of fluid.   The following portions of the patient's history were reviewed and updated as appropriate: allergies, current medications, past family history, past medical history, past social history, past surgical history and problem list. Problem list updated.  Objective:   Vitals:   08/29/18 0836  BP: 116/79  Pulse: 76  Weight: 265 lb 6.4 oz (120.4 kg)    Fetal Status: Fetal Heart Rate (bpm): 160 Fundal Height: 39 cm Movement: Present  Presentation: Vertex  General:  Alert, oriented and cooperative. Patient is in no acute distress.  Skin: Skin is warm and dry. No rash noted.   Cardiovascular: Normal heart rate noted  Respiratory: Normal respiratory effort, no problems with respiration noted  Abdomen: Soft, gravid, appropriate for gestational age. Pain/Pressure: Present     Pelvic:  Cervical exam performed Dilation: 3 Effacement (%): 50 Station: -3  Extremities: Normal range of motion.  Edema: None  Mental Status: Normal mood and affect. Normal behavior. Normal judgment and thought content.   Urinalysis:      Assessment and Plan:  Pregnancy: G4P3003 at [redacted]w[redacted]d  Declines membrane stripping. D/w her re: PDIOL and would like to set up nv Rob and nst nv.  Pt told to keep an eye on weight.   Term labor symptoms and general obstetric precautions including but not limited to vaginal  bleeding, contractions, leaking of fluid and fetal movement were reviewed in detail with the patient. Please refer to After Visit Summary for other counseling recommendations.  Return in about 10 days (around 09/08/2018) for rob and nst.   Lewiston Bing, MD

## 2018-09-01 ENCOUNTER — Encounter (HOSPITAL_COMMUNITY): Payer: Self-pay

## 2018-09-01 ENCOUNTER — Other Ambulatory Visit: Payer: Self-pay

## 2018-09-01 ENCOUNTER — Inpatient Hospital Stay (HOSPITAL_COMMUNITY)
Admission: AD | Admit: 2018-09-01 | Discharge: 2018-09-02 | DRG: 807 | Disposition: A | Discharge status: Home / Self Care | Payer: BC Managed Care – PPO | Attending: Obstetrics and Gynecology | Admitting: Obstetrics and Gynecology

## 2018-09-01 DIAGNOSIS — E559 Vitamin D deficiency, unspecified: Secondary | ICD-10-CM

## 2018-09-01 DIAGNOSIS — Z1159 Encounter for screening for other viral diseases: Secondary | ICD-10-CM | POA: Diagnosis not present

## 2018-09-01 DIAGNOSIS — O4202 Full-term premature rupture of membranes, onset of labor within 24 hours of rupture: Secondary | ICD-10-CM | POA: Diagnosis present

## 2018-09-01 DIAGNOSIS — Z8632 Personal history of gestational diabetes: Secondary | ICD-10-CM

## 2018-09-01 DIAGNOSIS — E669 Obesity, unspecified: Secondary | ICD-10-CM

## 2018-09-01 DIAGNOSIS — O09299 Supervision of pregnancy with other poor reproductive or obstetric history, unspecified trimester: Secondary | ICD-10-CM

## 2018-09-01 DIAGNOSIS — Z3A39 39 weeks gestation of pregnancy: Secondary | ICD-10-CM | POA: Diagnosis not present

## 2018-09-01 DIAGNOSIS — Z6841 Body Mass Index (BMI) 40.0 and over, adult: Secondary | ICD-10-CM

## 2018-09-01 LAB — CBC
HCT: 32.9 % — ABNORMAL LOW (ref 36.0–46.0)
Hemoglobin: 10.7 g/dL — ABNORMAL LOW (ref 12.0–15.0)
MCH: 26 pg (ref 26.0–34.0)
MCHC: 32.5 g/dL (ref 30.0–36.0)
MCV: 80 fL (ref 80.0–100.0)
Platelets: 358 10*3/uL (ref 150–400)
RBC: 4.11 MIL/uL (ref 3.87–5.11)
RDW: 13.7 % (ref 11.5–15.5)
WBC: 11.5 10*3/uL — ABNORMAL HIGH (ref 4.0–10.5)
nRBC: 0 % (ref 0.0–0.2)

## 2018-09-01 LAB — ABO/RH: ABO/RH(D): A POS

## 2018-09-01 LAB — TYPE AND SCREEN
ABO/RH(D): A POS
Antibody Screen: NEGATIVE

## 2018-09-01 LAB — RPR: RPR Ser Ql: NONREACTIVE

## 2018-09-01 LAB — POCT FERN TEST: POCT Fern Test: POSITIVE

## 2018-09-01 LAB — SARS CORONAVIRUS 2 BY RT PCR (HOSPITAL ORDER, PERFORMED IN ~~LOC~~ HOSPITAL LAB): SARS Coronavirus 2: NEGATIVE

## 2018-09-01 MED ORDER — ZOLPIDEM TARTRATE 5 MG PO TABS: 5.0000 mg | ORAL_TABLET | Freq: Every evening | ORAL | Status: DC | PRN

## 2018-09-01 MED ORDER — PRENATAL MULTIVITAMIN CH
1.0000 | ORAL_TABLET | Freq: Every day | ORAL | Status: DC
  Administered 2018-09-01 – 2018-09-02 (×2): 1 via ORAL
  Filled 2018-09-01 (×2): qty 1

## 2018-09-01 MED ORDER — ACETAMINOPHEN 325 MG PO TABS
650.0000 mg | ORAL_TABLET | ORAL | Status: DC | PRN
  Administered 2018-09-01: 650 mg via ORAL
  Filled 2018-09-01: qty 2

## 2018-09-01 MED ORDER — DIBUCAINE (PERIANAL) 1 % EX OINT: 1.0000 "application " | TOPICAL_OINTMENT | CUTANEOUS | Status: DC | PRN

## 2018-09-01 MED ORDER — WITCH HAZEL-GLYCERIN EX PADS: 1.0000 "application " | MEDICATED_PAD | CUTANEOUS | Status: DC | PRN

## 2018-09-01 MED ORDER — SENNOSIDES-DOCUSATE SODIUM 8.6-50 MG PO TABS
2.0000 | ORAL_TABLET | ORAL | Status: DC
  Administered 2018-09-02: 2 via ORAL
  Filled 2018-09-01: qty 2

## 2018-09-01 MED ORDER — OXYTOCIN 40 UNITS IN NORMAL SALINE INFUSION - SIMPLE MED
2.5000 [IU]/h | INTRAVENOUS | Status: DC
  Filled 2018-09-01: qty 1000

## 2018-09-01 MED ORDER — OXYCODONE-ACETAMINOPHEN 5-325 MG PO TABS: 2.0000 | ORAL_TABLET | ORAL | Status: DC | PRN

## 2018-09-01 MED ORDER — DIPHENHYDRAMINE HCL 25 MG PO CAPS: 25.0000 mg | ORAL_CAPSULE | Freq: Four times a day (QID) | ORAL | Status: DC | PRN

## 2018-09-01 MED ORDER — LACTATED RINGERS IV SOLN: 500.0000 mL | INTRAVENOUS | Status: DC | PRN

## 2018-09-01 MED ORDER — SOD CITRATE-CITRIC ACID 500-334 MG/5ML PO SOLN: 30.0000 mL | ORAL | Status: DC | PRN

## 2018-09-01 MED ORDER — BENZOCAINE-MENTHOL 20-0.5 % EX AERO: 1.0000 "application " | INHALATION_SPRAY | CUTANEOUS | Status: DC | PRN

## 2018-09-01 MED ORDER — LACTATED RINGERS IV SOLN
INTRAVENOUS | Status: DC
  Administered 2018-09-01 (×2): via INTRAVENOUS

## 2018-09-01 MED ORDER — FLEET ENEMA 7-19 GM/118ML RE ENEM: 1.0000 | ENEMA | RECTAL | Status: DC | PRN

## 2018-09-01 MED ORDER — TETANUS-DIPHTH-ACELL PERTUSSIS 5-2.5-18.5 LF-MCG/0.5 IM SUSP: 0.5000 mL | Freq: Once | INTRAMUSCULAR | Status: DC

## 2018-09-01 MED ORDER — OXYCODONE-ACETAMINOPHEN 5-325 MG PO TABS: 1.0000 | ORAL_TABLET | ORAL | Status: DC | PRN

## 2018-09-01 MED ORDER — OXYCODONE HCL 5 MG PO TABS: 5.0000 mg | ORAL_TABLET | ORAL | Status: DC | PRN

## 2018-09-01 MED ORDER — ONDANSETRON HCL 4 MG/2ML IJ SOLN: 4.0000 mg | INTRAMUSCULAR | Status: DC | PRN

## 2018-09-01 MED ORDER — COCONUT OIL OIL: 1.0000 "application " | TOPICAL_OIL | Status: DC | PRN

## 2018-09-01 MED ORDER — OXYTOCIN BOLUS FROM INFUSION
500.0000 mL | Freq: Once | INTRAVENOUS | Status: AC
  Administered 2018-09-01: 09:00:00 500 mL via INTRAVENOUS

## 2018-09-01 MED ORDER — ONDANSETRON HCL 4 MG PO TABS: 4.0000 mg | ORAL_TABLET | ORAL | Status: DC | PRN

## 2018-09-01 MED ORDER — ACETAMINOPHEN 325 MG PO TABS
650.0000 mg | ORAL_TABLET | ORAL | Status: DC | PRN
  Administered 2018-09-01 – 2018-09-02 (×3): 650 mg via ORAL
  Filled 2018-09-01 (×3): qty 2

## 2018-09-01 MED ORDER — LIDOCAINE HCL (PF) 1 % IJ SOLN: 30.0000 mL | INTRAMUSCULAR | Status: DC | PRN

## 2018-09-01 MED ORDER — SIMETHICONE 80 MG PO CHEW: 80.0000 mg | CHEWABLE_TABLET | ORAL | Status: DC | PRN

## 2018-09-01 MED ORDER — IBUPROFEN 600 MG PO TABS
600.0000 mg | ORAL_TABLET | Freq: Four times a day (QID) | ORAL | Status: DC
  Administered 2018-09-01 – 2018-09-02 (×5): 600 mg via ORAL
  Filled 2018-09-01 (×5): qty 1

## 2018-09-01 MED ORDER — FENTANYL CITRATE (PF) 100 MCG/2ML IJ SOLN
100.0000 ug | INTRAMUSCULAR | Status: DC | PRN
  Administered 2018-09-01 (×2): 100 ug via INTRAVENOUS
  Filled 2018-09-01 (×2): qty 2

## 2018-09-01 MED ORDER — ONDANSETRON HCL 4 MG/2ML IJ SOLN: 4.0000 mg | Freq: Four times a day (QID) | INTRAMUSCULAR | Status: DC | PRN

## 2018-09-01 NOTE — MAU Note (Signed)
0315 water broke.  Clear fluid.  No bleeding. Baby moving well. Occas contraction.

## 2018-09-01 NOTE — Plan of Care (Signed)
  Problem: Education: Goal: Knowledge of condition will improve Note:  Admission education, safety and unit protocols reviewed with patient and significant other. Maudry Diego Irwin    Problem: Activity: Goal: Ability to tolerate increased activity will improve Note:  Instructed patient to call for assistance for the first time ambulating to the bathroom. Earl Gala, Linda Hedges Linden

## 2018-09-01 NOTE — H&P (Addendum)
OBSTETRIC ADMISSION HISTORY AND PHYSICAL  Cassandra Vazquez is a 33 y.o. female [phone] with IUP at [redacted]w[redacted]d by LMP presenting for SROM. Pt reports that she was conditioning her hair at 0300 when she felt a trickle of fluid. Reports once she arrived to MAU, she had a large gush of clear fluid. Pt reports non painful contractions every 5 mins. Denies vaginal bleeding. Reports good fetal movement.   She received her prenatal care at [facility].  Support person in labor: Jabari  Ultrasounds . Anatomy U/S: normal  Prenatal History/Complications: . None  Past Medical History: Past Medical History:  Diagnosis Date  . History of A2 gestational diabetes 10/22/2016   Negative postpartum 2 hr GTT    Past Surgical History: History reviewed. No pertinent surgical history.  Obstetrical History: OB History    Gravida  4   Para  3   Term  3   Preterm      AB      Living  3     SAB      TAB      Ectopic      Multiple  0   Live Births  3        Obstetric Comments  83 and 2013 TSVD; no issues or problems during those pregnancies. Largest was 8lbs 15oz. Both children are healthy        Social History: Social History   Socioeconomic History  . Marital status: Married    Spouse name: Not on file  . Number of children: Not on file  . Years of education: Not on file  . Highest education level: Not on file  Occupational History  . Not on file  Social Needs  . Financial resource strain: Not on file  . Food insecurity:    Worry: Not on file    Inability: Not on file  . Transportation needs:    Medical: Not on file    Non-medical: Not on file  Tobacco Use  . Smoking status: Never Smoker  . Smokeless tobacco: Never Used  Substance and Sexual Activity  . Alcohol use: Yes    Comment: wine one glass per night  . Drug use: No  . Sexual activity: Yes  Lifestyle  . Physical activity:    Days per week: Not on file    Minutes per session: Not on file  . Stress: Not on  file  Relationships  . Social connections:    Talks on phone: Not on file    Gets together: Not on file    Attends religious service: Not on file    Active member of club or organization: Not on file    Attends meetings of clubs or organizations: Not on file    Relationship status: Not on file  Other Topics Concern  . 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 Diabetes Mother   . Diabetes Father   . CVA Father   . Colon cancer Neg Hx   . Breast cancer Neg Hx     Allergies: No Known Allergies  Medications Prior to Admission  Medication Sig Dispense Refill Last Dose  . Prenatal Vit-Fe Fumarate-FA (MULTIVITAMIN-PRENATAL) 27-0.8 MG TABS tablet Take 1 tablet by mouth daily at 12 noon.   Past Week at Unknown time  . aspirin EC 81  MG tablet Take 1 tablet (81 mg total) by mouth daily. (Patient not taking: Reported on 08/10/2018) 30 tablet 10 Not Taking  . ibuprofen (ADVIL,MOTRIN) 600 MG tablet Take 1 tablet (600 mg total) by mouth every 6 (six) hours as needed for moderate pain or cramping. (Patient not taking: Reported on 01/27/2017) 30 tablet 0 Not Taking  . ondansetron (ZOFRAN ODT) 4 MG disintegrating tablet Take 1 tablet (4 mg total) by mouth every 8 (eight) hours as needed for nausea or vomiting. (Patient not taking: Reported on 06/06/2018) 20 tablet 0 Not Taking  . Vitamin D, Ergocalciferol, (DRISDOL) 1.25 MG (50000 UT) CAPS capsule Take 1 capsule (50,000 Units total) by mouth every 7 (seven) days. 5 capsule 3 Taking     Review of Systems  All systems reviewed and negative except as stated in HPI  Last menstrual period 11/29/2017, unknown if currently breastfeeding. General appearance: alert, cooperative, appears stated age and no distress Lungs: no respiratory distress Heart: regular rate  Abdomen: soft, non-tender; gravid Extremities: Homans sign  is negative, no sign of DVT Presentation: cephalic Fetal monitoring: FHR 135, moderate variability, +accels, no decels Uterine activity: q6 mins  Dilation: 4 Effacement (%): 80 Station: -2 Exam by:: benji stanley RN  Prenatal labs: ABO, Rh: A/Positive/-- (11/26 1551) Antibody: Negative (11/26 1551) Rubella: 1.19 (11/26 1551) RPR: Non Reactive (03/18 0834)  HBsAg: Negative (11/26 1551)  HIV: Non Reactive (03/18 0834)  GBS:  negative Glucola: normal Genetic screening: NIPS: low risk female  Prenatal Transfer Tool  Maternal Diabetes: No Genetic Screening: Normal Maternal Ultrasounds/Referrals: Normal Fetal Ultrasounds or other Referrals:  None Maternal Substance Abuse:  No Significant Maternal Medications:  None Significant Maternal Lab Results: Lab values include: Group B Strep negative  Results for orders placed or performed during the hospital encounter of 09/01/18 (from the past 24 hour(s))  POCT fern test   Collection Time: 09/01/18  4:51 AM  Result Value Ref Range   POCT Fern Test Positive = ruptured amniotic membanes     Patient Active Problem List   Diagnosis Date Noted  . Vitamin D deficiency 02/22/2018  . Depression, recurrent (HCC) 09/02/2017  . History of gestational diabetes in prior pregnancy, currently pregnant 10/22/2016  . Supervision of high risk pregnancy, antepartum 10/21/2016  . BMI 45.0-49.9, adult (HCC) 10/21/2016    Assessment/Plan:  Cassandra Vazquez is a 33 y.o. G4P3003 at 7059w3d here for SROM of clear fluid at 0300.   Labor:  -- Expectant management at this time -- Pain control: planning IV pain medication  Fetal Wellbeing:  -- GBS negative -- Continuous fetal monitoring  Postpartum Planning -- Breast -- Contraception: FOB to get vasectomy   Camelia Enganielle Simpson, SNM   I personally saw and evaluated the patient, performing the key elements of the service. I developed and verified the management plan that is described in the  resident's/student's note, and I agree with the content with my edits above. Cathie BeamsFran Cresenzo-Dishmon, CNM 09/01/2018 7:47 AM

## 2018-09-01 NOTE — Lactation Note (Signed)
This note was copied from a baby's chart. Lactation Consultation Note  Patient Name: Cassandra Vazquez [id number] Date: 09/01/2018 Reason for consult: Initial assessment;Term P4  Mom states she breastfed her previous babies without difficulty.  Newborn is 5 hours old and latching frequently.  Baby currently in football hold and just finished a feeding.  Instructed to continue to feed with cues.  Encouraged to call out for assist/concerns.  Breastfeeding consultation services and support information given.  Maternal Data Does the patient have breastfeeding experience prior to this delivery?: Yes  Feeding Feeding Type: Breast Fed  LATCH Score Latch: Grasps breast easily, tongue down, lips flanged, rhythmical sucking.  Audible Swallowing: A few with stimulation  Type of Nipple: Everted at rest and after stimulation  Comfort (Breast/Nipple): Soft / non-tender  Hold (Positioning): No assistance needed to correctly position infant at breast.  LATCH Score: 9  Interventions    Lactation Tools Discussed/Used     Consult Status Consult Status: Follow-up Date: 09/02/18 Follow-up type: In-patient    Huston Foley 09/01/2018, 2:36 PM

## 2018-09-01 NOTE — Discharge Summary (Signed)
Postpartum Discharge Summary     Patient Name: Cassandra Vazquez DOB: 23-Apr-1985 MRN: [id number]  Date of admission: 09/01/2018 Delivering Provider: Genia Hotter E   Date of discharge: 09/02/2018  Admitting diagnosis: 39.3WKS WATER BROKE Intrauterine pregnancy: [redacted]w[redacted]d     Secondary diagnosis:  Active Problems:   BMI 45.0-49.9, adult (HCC)   History of gestational diabetes in prior pregnancy, currently pregnant   Vitamin D deficiency   Normal labor  Additional problems: none     Discharge diagnosis: Term Pregnancy Delivered                                                                                                Post partum procedures:none  Augmentation: none  Complications: None  Hospital course:  Onset of Labor With Vaginal Delivery     33 y.o. yo [id number] at [redacted]w[redacted]d was admitted in Latent Labor on 09/01/2018. She had a trickle of fluid at home at approx 0030 with a larger gush upon arrival to [facility]. Patient had an uncomplicated spontaneous labor course as follows:  Membrane Rupture Time/Date: 3:15 AM ,09/01/2018   Intrapartum Procedures: Episiotomy: None [1]                                         Lacerations:  1st degree [2]  Patient had a delivery of a Viable infant. 09/01/2018  Information for the patient's newborn:  Anamae, Barnick [[id number]]  Delivery Method: Vaginal, Spontaneous(Filed from Delivery Summary)    Pateint had an uncomplicated postpartum course.  She is ambulating, tolerating a regular diet, passing flatus, and urinating well. Patient is discharged home in stable condition on 09/02/18 per her request for early discharge if infant is able to go.   Magnesium Sulfate recieved: No BMZ received: No  Physical exam  Vitals:   09/01/18 1530 09/01/18 1943 09/02/18 0000 09/02/18 0550  BP: (!) 105/53 108/74 109/65 116/71  Pulse: 80 73 86 78  Resp: 16 18 18 18   Temp: 98.1 F (36.7 C) 99.2 F (37.3 C) 98.4 F (36.9 C) 98.6 F (37 C)  TempSrc: Oral Axillary Axillary  Oral  SpO2: 97% 100% 100% 100%   General: alert and cooperative Lochia: appropriate Uterine Fundus: firm Incision: N/A DVT Evaluation: No evidence of DVT seen on physical exam. Labs: Lab Results  Component Value Date   WBC 11.5 (H) 09/01/2018   HGB 10.7 (L) 09/01/2018   HCT 32.9 (L) 09/01/2018   MCV 80.0 09/01/2018   PLT 358 09/01/2018   CMP Latest Ref Rng & Units 05/06/2018  Glucose 70 - 99 mg/dL 88  BUN 6 - 20 mg/dL 6  Creatinine 0.24 - 0.97 mg/dL 3.53(G)  Sodium 992 - 426 mmol/L 136  Potassium 3.5 - 5.1 mmol/L 3.7  Chloride 98 - 111 mmol/L 106  CO2 22 - 32 mmol/L 22  Calcium 8.9 - 10.3 mg/dL 8.3(M)  Total Protein 6.5 - 8.1 g/dL 7.0  Total Bilirubin 0.3 - 1.2 mg/dL 0.5  Alkaline Phos 38 - 126 U/L  62  AST 15 - 41 U/L 29  ALT 0 - 44 U/L 18    Discharge instruction: per After Visit Summary and "Baby and Me Booklet".  After visit meds:  Allergies as of 09/02/2018   No Known Allergies     Medication List    STOP taking these medications   ondansetron 4 MG disintegrating tablet Commonly known as:  Zofran ODT     TAKE these medications   aspirin EC 81 MG tablet Take 1 tablet (81 mg total) by mouth daily.   ibuprofen 600 MG tablet Commonly known as:  ADVIL Take 1 tablet (600 mg total) by mouth every 6 (six) hours as needed. What changed:  reasons to take this   multivitamin-prenatal 27-0.8 MG Tabs tablet Take 1 tablet by mouth daily at 12 noon.   Vitamin D (Ergocalciferol) 1.25 MG (50000 UT) Caps capsule Commonly known as:  DRISDOL Take 1 capsule (50,000 Units total) by mouth every 7 (seven) days.       Diet: routine diet  Activity: Advance as tolerated. Pelvic rest for 6 weeks.   Outpatient follow up:4 weeks Follow up Appt: Future Appointments  Date Time Provider Department Center  09/07/2018  9:30 AM [place] BingPickens, Charlie, MD [facility]   Follow up Visit:  Please schedule this patient for Postpartum visit in: 4 weeks with the following  provider: Any provider For C/S patients schedule nurse incision check in weeks 2 weeks: no Low risk pregnancy complicated by: none Delivery mode:  SVD Anticipated Birth Control:  Condoms PP Procedures needed: none  Schedule Integrated BH visit: no  Newborn Data: Live born female  Birth Weight: 7 lb 3.9 oz (3285 g) APGAR: 9, 9  Newborn Delivery   Birth date/time:  09/01/2018 09:00:00 Delivery type:  Vaginal, Spontaneous     Baby Feeding: Bottle and Breast Disposition:home with mother- pending discharge   09/02/2018 Cassandra Vazquez, CNM  10:02 AM

## 2018-09-01 NOTE — MAU Note (Signed)
covid swab obtained without difficulty. Pt tol well.

## 2018-09-02 MED ORDER — IBUPROFEN 600 MG PO TABS: 600.0000 mg | ORAL_TABLET | Freq: Four times a day (QID) | ORAL | 0 refills | Status: DC | PRN

## 2018-09-02 NOTE — Lactation Note (Signed)
This note was copied from a baby's chart. Lactation Consultation Note  Patient Name: Cassandra Vazquez [id number] Date: 09/02/2018 Reason for consult: Follow-up assessment;Term;Infant weight loss  27 hours FT female who is being exclusively BF by her mother, she's a P4. Mom and baby are going home today, baby is at 5% weight loss. Per mom BF is going well and baby has been latching on without any difficulties. Reviewed engorgement prevention and treatment, treatment/prevention for sore nipples, red flags on when to call baby's pediatrician and discharge instructions.   Parents reported all questions and concerns were answered, they're both aware of [place] OP services and will contact PRN.  Maternal Data    Feeding    Interventions Interventions: Breast feeding basics reviewed  Lactation Tools Discussed/Used     Consult Status Consult Status: Complete Date: 09/02/18 Follow-up type: Call as needed    Sheri Prows Francene Boyers 09/02/2018, 12:25 PM

## 2018-09-02 NOTE — Progress Notes (Signed)
CSW received consult for hx of Anxiety and Depression.  CSW met with MOB to offer support and complete assessment.    Upon entering the room CSW observed that MOB and FOB were both at bedside. CSW congratulated MOB and FOB on the birth of infant. CSW explained  to [facility] HIPPA policy and she desired fro FOB to remain in the room. CSW understanding and advised MOB and FOB the reason for the visit.   MOB expressed that she was never diagnosed with depression but had PPD with lat child in 2018. 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 CSW was advised that MOB was never placed on medications for PPD but was in therapy for once session. MOB declined needing information for therapy at this time and denies being on medications at this time. MOB expressed that's he feels good. MOB scored 4 on [place] this admission with no concerns to CSW at this time.   MOB desires for infant to sleep in basinet and will be followed by [facility]. MOB currently denies feeling SI or HI. MOB has support from FOB and her mother per report.   CSW provided education regarding the baby blues period vs. perinatal mood disorders, discussed treatment and gave resources for mental health follow up if concerns arise.  CSW recommends self-evaluation during the postpartum time period using the New Mom Checklist from Postpartum Progress and encouraged MOB to contact a medical professional if symptoms are noted at any time.   CSW provided review of Sudden Infant Death Syndrome (SIDS) precautions.   CSW identifies no further need for intervention and no barriers to discharge at this time.    Virgie Dad Emanuella Nickle, MSW, LCSW-A [facility] [place] [facility] [place] [phone]

## 2018-09-02 NOTE — Discharge Instructions (Signed)
Vaginal Delivery, Care After °Refer to this sheet in the next few weeks. These instructions provide you with information about caring for yourself after vaginal delivery. Your health care provider may also give you more specific instructions. Your treatment has been planned according to current medical practices, but problems sometimes occur. Call your health care provider if you have any problems or questions. °What can I expect after the procedure? °After vaginal delivery, it is common to have: °· Some bleeding from your vagina. °· Soreness in your abdomen, your vagina, and the area of skin between your vaginal opening and your anus (perineum). °· Pelvic cramps. °· Fatigue. °Follow these instructions at home: °Medicines °· Take over-the-counter and prescription medicines only as told by your health care provider. °· If you were prescribed an antibiotic medicine, take it as told by your health care provider. Do not stop taking the antibiotic until it is finished. °Driving ° °· Do not drive or operate heavy machinery while taking prescription pain medicine. °· Do not drive for 24 hours if you received a sedative. °Lifestyle °· Do not drink alcohol. This is especially important if you are breastfeeding or taking medicine to relieve pain. °· Do not use tobacco products, including cigarettes, chewing tobacco, or e-cigarettes. If you need help quitting, ask your health care provider. °Eating and drinking °· Drink at least 8 eight-ounce glasses of water every day unless you are told not to by your health care provider. If you choose to breastfeed your baby, you may need to drink more water than this. °· Eat high-fiber foods every day. These foods may help prevent or relieve constipation. High-fiber foods include: °? Whole grain cereals and breads. °? Brown rice. °? Beans. °? Fresh fruits and vegetables. °Activity °· Return to your normal activities as told by your health care provider. Ask your health care provider what  activities are safe for you. °· Rest as much as possible. Try to rest or take a nap when your baby is sleeping. °· Do not lift anything that is heavier than your baby or 10 lb (4.5 kg) until your health care provider says that it is safe. °· Talk with your health care provider about when you can engage in sexual activity. This may depend on your: °? Risk of infection. °? Rate of healing. °? Comfort and desire to engage in sexual activity. °Vaginal Care °· If you have an episiotomy or a vaginal tear, check the area every day for signs of infection. Check for: °? More redness, swelling, or pain. °? More fluid or blood. °? Warmth. °? Pus or a bad smell. °· Do not use tampons or douches until your health care provider says this is safe. °· Watch for any blood clots that may pass from your vagina. These may look like clumps of dark red, brown, or black discharge. °General instructions °· Keep your perineum clean and dry as told by your health care provider. °· Wear loose, comfortable clothing. °· Wipe from front to back when you use the toilet. °· Ask your health care provider if you can shower or take a bath. If you had an episiotomy or a perineal tear during labor and delivery, your health care provider may tell you not to take baths for a certain length of time. °· Wear a bra that supports your breasts and fits you well. °· If possible, have someone help you with household activities and help care for your baby for at least a few days after you   leave the hospital. °· Keep all follow-up visits for you and your baby as told by your health care provider. This is important. °Contact a health care provider if: °· You have: °? Vaginal discharge that has a bad smell. °? Difficulty urinating. °? Pain when urinating. °? A sudden increase or decrease in the frequency of your bowel movements. °? More redness, swelling, or pain around your episiotomy or vaginal tear. °? More fluid or blood coming from your episiotomy or vaginal  tear. °? Pus or a bad smell coming from your episiotomy or vaginal tear. °? A fever. °? A rash. °? Little or no interest in activities you used to enjoy. °? Questions about caring for yourself or your baby. °· Your episiotomy or vaginal tear feels warm to the touch. °· Your episiotomy or vaginal tear is separating or does not appear to be healing. °· Your breasts are painful, hard, or turn red. °· You feel unusually sad or worried. °· You feel nauseous or you vomit. °· You pass large blood clots from your vagina. If you pass a blood clot from your vagina, save it to show to your health care provider. Do not flush blood clots down the toilet without having your health care provider look at them. °· You urinate more than usual. °· You are dizzy or light-headed. °· You have not breastfed at all and you have not had a menstrual period for 12 weeks after delivery. °· You have stopped breastfeeding and you have not had a menstrual period for 12 weeks after you stopped breastfeeding. °Get help right away if: °· You have: °? Pain that does not go away or does not get better with medicine. °? Chest pain. °? Difficulty breathing. °? Blurred vision or spots in your vision. °? Thoughts about hurting yourself or your baby. °· You develop pain in your abdomen or in one of your legs. °· You develop a severe headache. °· You faint. °· You bleed from your vagina so much that you fill two sanitary pads in one hour. °This information is not intended to replace advice given to you by your health care provider. Make sure you discuss any questions you have with your health care provider. °Document Released: 03/12/2000 Document Revised: 08/27/2015 Document Reviewed: 03/30/2015 °Elsevier Interactive Patient Education © 2019 [employer] ° °

## 2018-09-04 ENCOUNTER — Telehealth: Payer: Self-pay | Admitting: Radiology

## 2018-09-04 NOTE — Telephone Encounter (Signed)
Left message with postpartum appointment information

## 2018-09-07 ENCOUNTER — Encounter: Payer: BC Managed Care – PPO | Admitting: Obstetrics and Gynecology

## 2018-10-02 ENCOUNTER — Telehealth (INDEPENDENT_AMBULATORY_CARE_PROVIDER_SITE_OTHER): Payer: BC Managed Care – PPO | Admitting: Obstetrics and Gynecology

## 2018-10-02 ENCOUNTER — Other Ambulatory Visit: Payer: Self-pay

## 2018-10-02 NOTE — Progress Notes (Signed)
   TELEHEALTH VIRTUAL GYNECOLOGY VISIT ENCOUNTER NOTE  I connected with Cassandra Vazquez on 10/02/18 at  1:45 PM EDT by telephone at home and verified that I am speaking with the correct person using two identifiers.   I discussed the limitations, risks, security and privacy concerns of performing an evaluation and management service by telephone and the availability of in person appointments. I also discussed with the patient that there may be a patient responsible charge related to this service. The patient expressed understanding and agreed to proceed.  Appointment Date: 10/02/2018  OBGYN Clinic: [facility]  Primary Care Provider: Burnard Hawthorne  Chief Complaint:  Chief Complaint  Patient presents with  . Postpartum Care    History:  History of Present Illness: Cassandra Vazquez is a 33 y.o. African-American [id number] (No LMP recorded.), seen for the above chief complaint. Her past medical history is significant for BMI 40s, depression   She is s/p SVD/1st on 09/01/2018; she was discharged to home on PPD#2  Vaginal bleeding or discharge: No  Breast or formula feeding: breast  Intercourse: No  Contraception after delivery: lactional amenorrhea, condoms PP depression s/s: Yes  Any bowel or bladder issues: No  Pap smear: no abnormalities (date: 2018)  Review of Systems: as noted in the History of Present Illness.  Medications Cassandra Vazquez had no medications administered during this visit. Current Outpatient Medications  Medication Sig Dispense Refill  . Prenatal Vit-Fe Fumarate-FA (MULTIVITAMIN-PRENATAL) 27-0.8 MG TABS tablet Take 1 tablet by mouth daily at 12 noon.    . Vitamin D, Ergocalciferol, (DRISDOL) 1.25 MG (50000 UT) CAPS capsule Take 1 capsule (50,000 Units total) by mouth every 7 (seven) days. 5 capsule 3   No current facility-administered medications for this visit.     Allergies Patient has no known allergies.  Physical Exam:   General:   Alert, oriented and cooperative.   Mental Status: Normal mood and affect perceived. Normal judgment and thought content.  Physical exam deferred due to nature of the encounter  Labs and Imaging No results found for this or any previous visit (from the past 336 hour(s)). No results found.    Assessment and Plan:     No issues. D/w her re: lactational amenorrhea, resuming sexual intercourse, pap needed next year.       I discussed the assessment and treatment plan with the patient. The patient was provided an opportunity to ask questions and all were answered. The patient agreed with the plan and demonstrated an understanding of the instructions.    I provided 10 minutes of non-face-to-face time during this encounter. The visit was done via a MyChart Video visit.    Aletha Halim, MD  [facility] [place]

## 2019-03-14 ENCOUNTER — Encounter: Payer: BC Managed Care – PPO | Admitting: Family

## 2019-03-16 NOTE — Progress Notes (Signed)
This encounter was created in error - please disregard.

## 2019-04-18 ENCOUNTER — Ambulatory Visit: Payer: BC Managed Care – PPO | Admitting: Family

## 2019-04-18 NOTE — Progress Notes (Deleted)
Virtual Visit via Video Note  I connected with@  on 04/18/19 at  3:00 PM EST by a video enabled telemedicine application and verified that I am speaking with the correct person using two identifiers.  Location patient: home Location provider:work  Persons participating in the virtual visit: patient, provider  I discussed the limitations of evaluation and management by telemedicine and the availability of in person appointments. The patient expressed understanding and agreed to proceed.   HPI: COVID positive   ROS: See pertinent positives and negatives per HPI.  Past Medical History:  Diagnosis Date  . History of A2 gestational diabetes 10/22/2016   Negative postpartum 2 hr GTT    No past surgical history on file.  Family History  Problem Relation Age of Onset  . Diabetes Mother   . Diabetes Father   . CVA Father   . Colon cancer Neg Hx   . Breast cancer Neg Hx     SOCIAL HX: ***   Current Outpatient Medications:  .  aspirin EC 81 MG tablet, Take 1 tablet (81 mg total) by mouth daily. (Patient not taking: Reported on 08/10/2018), Disp: 30 tablet, Rfl: 10 .  ibuprofen (ADVIL) 600 MG tablet, Take 1 tablet (600 mg total) by mouth every 6 (six) hours as needed., Disp: 30 tablet, Rfl: 0 .  Prenatal Vit-Fe Fumarate-FA (MULTIVITAMIN-PRENATAL) 27-0.8 MG TABS tablet, Take 1 tablet by mouth daily at 12 noon., Disp: , Rfl:  .  Vitamin D, Ergocalciferol, (DRISDOL) 1.25 MG (50000 UT) CAPS capsule, Take 1 capsule (50,000 Units total) by mouth every 7 (seven) days., Disp: 5 capsule, Rfl: 3  EXAM:  VITALS per patient if applicable:  GENERAL: alert, oriented, appears well and in no acute distress  HEENT: atraumatic, conjunttiva clear, no obvious abnormalities on inspection of external nose and ears  NECK: normal movements of the head and neck  LUNGS: on inspection no signs of respiratory distress, breathing rate appears normal, no obvious gross SOB, gasping or wheezing  CV: no  obvious cyanosis  MS: moves all visible extremities without noticeable abnormality  PSYCH/NEURO: pleasant and cooperative, no obvious depression or anxiety, speech and thought processing grossly intact  ASSESSMENT AND PLAN:  Discussed the following assessment and plan:  No diagnosis found.  -we discussed possible serious and likely etiologies, options for evaluation and workup, limitations of telemedicine visit vs in person visit, treatment, treatment risks and precautions. Pt prefers to treat via telemedicine empirically rather then risking or undertaking an in person visit at this moment. Patient agrees to seek prompt in person care if worsening, new symptoms arise, or if is not improving with treatment.   I discussed the assessment and treatment plan with the patient. The patient was provided an opportunity to ask questions and all were answered. The patient agreed with the plan and demonstrated an understanding of the instructions.   The patient was advised to call back or seek an in-person evaluation if the symptoms worsen or if the condition fails to improve as anticipated.   Rennie Plowman, FNP

## 2019-11-02 ENCOUNTER — Other Ambulatory Visit (HOSPITAL_COMMUNITY)
Admission: RE | Admit: 2019-11-02 | Discharge: 2019-11-02 | Disposition: A | Discharge status: Home / Self Care | Payer: BC Managed Care – PPO | Source: Ambulatory Visit | Attending: Family | Admitting: Family

## 2019-11-02 ENCOUNTER — Encounter: Payer: Self-pay | Admitting: Family

## 2019-11-02 ENCOUNTER — Ambulatory Visit (INDEPENDENT_AMBULATORY_CARE_PROVIDER_SITE_OTHER): Payer: BC Managed Care – PPO | Admitting: Family

## 2019-11-02 ENCOUNTER — Other Ambulatory Visit: Payer: Self-pay

## 2019-11-02 VITALS — BP 118/74 | HR 87 | Temp 98.6°F | Resp 14 | Ht 64.0 in | Wt 274.0 lb

## 2019-11-02 DIAGNOSIS — Z3009 Encounter for other general counseling and advice on contraception: Secondary | ICD-10-CM | POA: Diagnosis not present

## 2019-11-02 DIAGNOSIS — Z0000 Encounter for general adult medical examination without abnormal findings: Secondary | ICD-10-CM

## 2019-11-02 DIAGNOSIS — Z6841 Body Mass Index (BMI) 40.0 and over, adult: Secondary | ICD-10-CM

## 2019-11-02 LAB — CBC WITH DIFFERENTIAL/PLATELET
Basophils Absolute: 0 10*3/uL (ref 0.0–0.1)
Basophils Relative: 0.3 % (ref 0.0–3.0)
Eosinophils Absolute: 0.3 10*3/uL (ref 0.0–0.7)
Eosinophils Relative: 2.3 % (ref 0.0–5.0)
HCT: 35.2 % — ABNORMAL LOW (ref 36.0–46.0)
Hemoglobin: 11.6 g/dL — ABNORMAL LOW (ref 12.0–15.0)
Lymphocytes Relative: 22 % (ref 12.0–46.0)
Lymphs Abs: 2.5 10*3/uL (ref 0.7–4.0)
MCHC: 32.9 g/dL (ref 30.0–36.0)
MCV: 85.4 fl (ref 78.0–100.0)
Monocytes Absolute: 0.6 10*3/uL (ref 0.1–1.0)
Monocytes Relative: 5.6 % (ref 3.0–12.0)
Neutro Abs: 7.8 10*3/uL — ABNORMAL HIGH (ref 1.4–7.7)
Neutrophils Relative %: 69.8 % (ref 43.0–77.0)
Platelets: 439 10*3/uL — ABNORMAL HIGH (ref 150.0–400.0)
RBC: 4.12 Mil/uL (ref 3.87–5.11)
RDW: 13.4 % (ref 11.5–15.5)
WBC: 11.2 10*3/uL — ABNORMAL HIGH (ref 4.0–10.5)

## 2019-11-02 LAB — LIPID PANEL
Cholesterol: 229 mg/dL — ABNORMAL HIGH (ref 0–200)
HDL: 53 mg/dL (ref >39.00)
LDL Cholesterol: 154 mg/dL — ABNORMAL HIGH (ref 0–99)
NonHDL: 176.17
Total CHOL/HDL Ratio: 4
Triglycerides: 109 mg/dL (ref 0.0–149.0)
VLDL: 21.8 mg/dL (ref 0.0–40.0)

## 2019-11-02 LAB — VITAMIN D 25 HYDROXY (VIT D DEFICIENCY, FRACTURES): VITD: 10.87 ng/mL — ABNORMAL LOW (ref 30.00–100.00)

## 2019-11-02 LAB — COMPREHENSIVE METABOLIC PANEL
ALT: 15 U/L (ref 0–35)
AST: 14 U/L (ref 0–37)
Albumin: 4.2 g/dL (ref 3.5–5.2)
Alkaline Phosphatase: 61 U/L (ref 39–117)
BUN: 13 mg/dL (ref 6–23)
CO2: 27 mEq/L (ref 19–32)
Calcium: 9.3 mg/dL (ref 8.4–10.5)
Chloride: 104 mEq/L (ref 96–112)
Creatinine, Ser: 0.59 mg/dL (ref 0.40–1.20)
GFR: 142.58 mL/min (ref >60.00)
Glucose, Bld: 81 mg/dL (ref 70–99)
Potassium: 4.5 mEq/L (ref 3.5–5.1)
Sodium: 138 mEq/L (ref 135–145)
Total Bilirubin: 0.3 mg/dL (ref 0.2–1.2)
Total Protein: 7.2 g/dL (ref 6.0–8.3)

## 2019-11-02 LAB — TSH: TSH: 1.82 u[IU]/mL (ref 0.35–4.50)

## 2019-11-02 LAB — HEMOGLOBIN A1C: Hgb A1c MFr Bld: 5.4 % (ref 4.6–6.5)

## 2019-11-02 MED ORDER — SAXENDA 18 MG/3ML ~~LOC~~ SOPN: 0.6000 mg | PEN_INJECTOR | Freq: Every day | SUBCUTANEOUS | 4 refills | Status: DC

## 2019-11-02 MED ORDER — LEVONORGESTREL-ETHINYL ESTRAD 0.1-20 MG-MCG PO TABS: 1.0000 | ORAL_TABLET | Freq: Every day | ORAL | 11 refills | Status: DC

## 2019-11-02 NOTE — Assessment & Plan Note (Addendum)
Discussed low carbohydrate diet.  I provided sample diet on her after visit summary.  Discussed Wellbutrin versus Saxenda.  Patient does on occasion have alcohol.  We jointly agreed we would start with Saxenda.  Discussed with her the black box warning into regarding thyroid cancer.  She has no known or personal history of thyroid cancer she will stay vigilant for this.  We will follow up in 6 to 8 weeks.

## 2019-11-02 NOTE — Progress Notes (Signed)
Subjective:    Patient ID: Cassandra Vazquez, female    DOB: 09-21-1985, 34 y.o.   MRN: [id number]  CC: Cassandra Vazquez is a 34 y.o. female who presents today for physical exam.    HPI: Overall feels well today.  Would like to discuss weight loss medication.  She is frustrated as had trouble losing weight after pregnancy.  No depression.  No personal or family history of thyroid cancer.  Would also like to start "a pill" for birth control.  At some point her husband might get a vasectomy however he is not at this time.  No history of DVT, migraine with aura. No history of cancer. Patient states she's not pregnant or breast-feeding.No concern for STDs.     Colorectal Cancer Screening: no early family history Breast Cancer Screening: no early family history Cervical Cancer Screening: due; no h/o abnormal pap        Tetanus - utd       Hepatitis C screening - Candidate for, consents  Labs: Screening labs today. Exercise: Gets regular exercise, walking.  Alcohol use: wine every evening Smoking/tobacco use: Nonsmoker.   HISTORY:  Past Medical History:  Diagnosis Date  . History of A2 gestational diabetes 10/22/2016   Negative postpartum 2 hr GTT    History reviewed. No pertinent surgical history. Family History  Problem Relation Age of Onset  . Diabetes Mother   . Diabetes Father   . CVA Father   . Colon cancer Neg Hx   . Breast cancer Neg Hx   . Thyroid cancer Neg Hx       ALLERGIES: Patient has no known allergies.  No current outpatient medications on file prior to visit.   No current facility-administered medications on file prior to visit.    Social History   Tobacco Use  . Smoking status: Never Smoker  . Smokeless tobacco: Never Used  Vaping Use  . Vaping Use: Never used  Substance Use Topics  . Alcohol use: Yes    Comment: wine one glass per night  . Drug use: No    Review of Systems  Constitutional: Negative for chills, fever and unexpected weight change.   HENT: Negative for congestion and trouble swallowing.   Respiratory: Negative for cough.   Cardiovascular: Negative for chest pain, palpitations and leg swelling.  Gastrointestinal: Negative for nausea and vomiting.  Genitourinary: Negative for pelvic pain.  Musculoskeletal: Negative for arthralgias and myalgias.  Skin: Negative for rash.  Neurological: Negative for headaches.  Hematological: Negative for adenopathy.  Psychiatric/Behavioral: Negative for confusion.      Objective:    BP 118/74 (BP Location: Left Arm, Patient Position: Sitting, Cuff Size: Large)   Pulse 87   Temp 98.6 F (37 C) (Oral)   Resp 14   Ht 5\' 4"  (1.626 m)   Wt 274 lb (124.3 kg)   SpO2 99%   BMI 47.03 kg/m   BP Readings from Last 3 Encounters:  11/02/19 118/74  09/02/18 116/71  08/29/18 116/79   Wt Readings from Last 3 Encounters:  11/02/19 274 lb (124.3 kg)  08/29/18 265 lb 6.4 oz (120.4 kg)  08/23/18 256 lb 9.6 oz (116.4 kg)    Physical Exam Vitals reviewed.  Constitutional:      Appearance: She is well-developed.  Eyes:     Conjunctiva/sclera: Conjunctivae normal.  Neck:     Thyroid: No thyroid mass or thyromegaly.  Cardiovascular:     Rate and Rhythm: Normal rate and regular rhythm.  Pulses: Normal pulses.     Heart sounds: Normal heart sounds.  Pulmonary:     Effort: Pulmonary effort is normal.     Breath sounds: Normal breath sounds. No wheezing, rhonchi or rales.  Chest:     Breasts: Breasts are symmetrical.        Right: No inverted nipple, mass, nipple discharge, skin change or tenderness.        Left: No inverted nipple, mass, nipple discharge, skin change or tenderness.  Genitourinary:    Cervix: No cervical motion tenderness, discharge or friability.     Uterus: Not enlarged, not fixed and not tender.      Adnexa:        Right: No mass, tenderness or fullness.         Left: No mass, tenderness or fullness.       Comments: Pap performed. No CMT. Unable to  appreciated ovaries. Lymphadenopathy:     Head:     Right side of head: No submental, submandibular, tonsillar, preauricular, posterior auricular or occipital adenopathy.     Left side of head: No submental, submandibular, tonsillar, preauricular, posterior auricular or occipital adenopathy.     Cervical:     Right cervical: No superficial, deep or posterior cervical adenopathy.    Left cervical: No superficial, deep or posterior cervical adenopathy.     Upper Body:     Right upper body: No pectoral adenopathy.     Left upper body: No pectoral adenopathy.  Skin:    General: Skin is warm and dry.  Neurological:     Mental Status: She is alert.  Psychiatric:        Speech: Speech normal.        Behavior: Behavior normal.        Thought Content: Thought content normal.        Assessment & Plan:   Problem List Items Addressed This Visit      Other   Birth control counseling    No contraindication to starting combined oral progesterone-estrogen birth control at this time.      Relevant Medications   levonorgestrel-ethinyl estradiol (ALESSE) 0.1-20 MG-MCG tablet   BMI 45.0-49.9, adult (HCC)    Discussed low carbohydrate diet.  I provided sample diet on her after visit summary.  Discussed Wellbutrin versus Saxenda.  Patient does on occasion have alcohol.  We jointly agreed we would start with Saxenda.  Discussed with her the black box warning into regarding thyroid cancer.  She has no known or personal history of thyroid cancer she will stay vigilant for this.  We will follow up in 6 to 8 weeks.      Relevant Medications   Liraglutide -Weight Management (SAXENDA) 18 MG/3ML SOPN   Other Relevant Orders   TSH   Routine physical examination - Primary    Clinical breast exam and Pap smear performed today.  Encouraged continued exercise      Relevant Orders   TSH   CBC with Differential/Platelet   Cytology - PAP   Comprehensive metabolic panel   Hemoglobin A1c   Hepatitis C  antibody   Lipid panel   VITAMIN D 25 Hydroxy (Vit-D Deficiency, Fractures)       I have discontinued Cassandra Vazquez's Vitamin D (Ergocalciferol), multivitamin-prenatal, aspirin EC, and ibuprofen. I am also having her start on Saxenda and levonorgestrel-ethinyl estradiol.   Meds ordered this encounter  Medications  . Liraglutide -Weight Management (SAXENDA) 18 MG/3ML SOPN    Sig: Inject 0.1  mLs (0.6 mg total) into the skin daily.    Dispense:  3 mL    Refill:  4    Order Specific Question:   Supervising Provider    Answer:   Duncan Dull L [[id number]]  . levonorgestrel-ethinyl estradiol (ALESSE) 0.1-20 MG-MCG tablet    Sig: Take 1 tablet by mouth daily.    Dispense:  31 tablet    Refill:  11    Order Specific Question:   Supervising Provider    Answer:   Sherlene Shams [[id number]]    Return precautions given.   Risks, benefits, and alternatives of the medications and treatment plan prescribed today were discussed, and patient expressed understanding.   Education regarding symptom management and diagnosis given to patient on AVS.   Continue to follow with Allegra Grana, FNP for routine health maintenance.   [name] and I agreed with plan.   Rennie Plowman, FNP

## 2019-11-02 NOTE — Assessment & Plan Note (Signed)
Clinical breast exam and Pap smear performed today.  Encouraged continued exercise

## 2019-11-02 NOTE — Assessment & Plan Note (Signed)
No contraindication to starting combined oral progesterone-estrogen birth control at this time.

## 2019-11-02 NOTE — Patient Instructions (Addendum)
Start oral combined birth control.    Trial of Saxenda. Please read information on medication below and remember black box warning that you may not take if you or a family member is diagnosed with thyroid cancer.    You may increase by 0.6 mg/day once a week. For instance, first week, take 0.6mg  [place] daily. Second week take 1.2 mg [place] daily. Third week take 1.8 mg [place] daily...   Max is 3 mg/day.    Remember the goal of weight loss is 1 to 2 pounds maximum per week. Its VERY reasonable to stay on a dose for a couple of weeks ( or more) prior to increasing. We dont want to increase too fast.   Good luck!    Liraglutide injection (Weight Management) What is this medicine? LIRAGLUTIDE (LIR a GLOO tide) is used to help people lose weight and maintain weight loss. It is used with a reduced-calorie diet and exercise. This medicine may be used for other purposes; ask your health care provider or pharmacist if you have questions. COMMON BRAND NAME(S): Saxenda What should I tell my health care provider before I take this medicine? They need to know if you have any of these conditions:  endocrine tumors (MEN 2) or if someone in your family had these tumors  gallbladder disease  high cholesterol  history of alcohol abuse problem  history of pancreatitis  kidney disease or if you are on dialysis  liver disease  previous swelling of the tongue, face, or lips with difficulty breathing, difficulty swallowing, hoarseness, or tightening of the throat  stomach problems  suicidal thoughts, plans, or attempt; a previous suicide attempt by you or a family member  thyroid cancer or if someone in your family had thyroid cancer  an unusual or allergic reaction to liraglutide, other medicines, foods, dyes, or preservatives  pregnant or trying to get pregnant  breast-feeding How should I use this medicine? This medicine is for injection under the skin of your upper leg, stomach area, or upper arm.  You will be taught how to prepare and give this medicine. Use exactly as directed. Take your medicine at regular intervals. Do not take it more often than directed. This drug comes with INSTRUCTIONS FOR USE. Ask your pharmacist for directions on how to use this drug. Read the information carefully. Talk to your pharmacist or health care provider if you have questions. It is important that you put your used needles and syringes in a special sharps container. Do not put them in a trash can. If you do not have a sharps container, call your pharmacist or healthcare provider to get one. A special MedGuide will be given to you by the pharmacist with each prescription and refill. Be sure to read this information carefully each time. Talk to your pediatrician regarding the use of this medicine in children. Special care may be needed. Overdosage: If you think you have taken too much of this medicine contact a poison control center or emergency room at once. NOTE: This medicine is only for you. Do not share this medicine with others. What if I miss a dose? If you miss a dose, take it as soon as you can. If it is almost time for your next dose, take only that dose. Do not take double or extra doses. If you miss your dose for 3 days or more, call your doctor or health care professional to talk about how to restart this medicine. What may interact with this medicine?  insulin and other medicines for diabetes This list may not describe all possible interactions. Give your health care provider a list of all the medicines, herbs, non-prescription drugs, or dietary supplements you use. Also tell them if you smoke, drink alcohol, or use illegal drugs. Some items may interact with your medicine. What should I watch for while using this medicine? Visit your doctor or health care professional for regular checks on your progress. Drink plenty of fluids while taking this medicine. Check with your doctor or health care  professional if you get an attack of severe diarrhea, nausea, and vomiting. The loss of too much body fluid can make it dangerous for you to take this medicine. This medicine may affect blood sugar levels. Ask your healthcare provider if changes in diet or medicines are needed if you have diabetes. Patients and their families should watch out for worsening depression or thoughts of suicide. Also watch out for sudden changes in feelings such as feeling anxious, agitated, panicky, irritable, hostile, aggressive, impulsive, severely restless, overly excited and hyperactive, or not being able to sleep. If this happens, especially at the beginning of treatment or after a change in dose, call your health care professional. Women should inform their health care provider if they wish to become pregnant or think they might be pregnant. Losing weight while pregnant is not advised and may cause harm to the unborn child. Talk to your health care provider for more information. What side effects may I notice from receiving this medicine? Side effects that you should report to your doctor or health care professional as soon as possible:  allergic reactions like skin rash, itching or hives, swelling of the face, lips, or tongue  breathing problems  diarrhea that continues or is severe  lump or swelling on the neck  severe nausea  signs and symptoms of infection like fever or chills; cough; sore throat; pain or trouble passing urine  signs and symptoms of low blood sugar such as feeling anxious; confusion; dizziness; increased hunger; unusually weak or tired; increased sweating; shakiness; cold, clammy skin; irritable; headache; blurred vision; fast heartbeat; loss of consciousness  signs and symptoms of kidney injury like trouble passing urine or change in the amount of urine  trouble swallowing  unusual stomach upset or pain  vomiting Side effects that usually do not require medical attention (report to  your doctor or health care professional if they continue or are bothersome):  constipation  decreased appetite  diarrhea  fatigue  headache  nausea  pain, redness, or irritation at site where injected  stomach upset  stuffy or runny nose This list may not describe all possible side effects. Call your doctor for medical advice about side effects. You may report side effects to FDA at 1-800-FDA-1088. Where should I keep my medicine? Keep out of the reach of children. Store unopened pen in a refrigerator between 2 and 8 degrees C (36 and 46 degrees F). Do not freeze or use if the medicine has been frozen. Protect from light and excessive heat. After you first use the pen, it can be stored at room temperature between 15 and 30 degrees C (59 and 86 degrees F) or in a refrigerator. Throw away your used pen after 30 days or after the expiration date, whichever comes first. Do not store your pen with the needle attached. If the needle is left on, medicine may leak from the pen. NOTE: This sheet is a summary. It may not cover all possible information.  If you have questions about this medicine, talk to your doctor, pharmacist, or health care provider.  2020 Elsevier/Gold Standard (2019-01-18 21:16:59)  Oral Contraception Use Oral contraceptive pills (OCPs) are medicines that you take to prevent pregnancy. OCPs work by:  Preventing the ovaries from releasing eggs.  Thickening mucus in the lower part of the uterus (cervix), which prevents sperm from entering the uterus.  Thinning the lining of the uterus (endometrium), which prevents a fertilized egg from attaching to the endometrium. OCPs are highly effective when taken exactly as prescribed. However, OCPs do not prevent sexually transmitted infections (STIs). Safe sex practices, such as using condoms while on an OCP, can help prevent STIs. Before taking OCPs, you may have a physical exam, blood test, and Pap test. A Pap test involves  taking a sample of cells from your cervix to check for cancer. Discuss with your health care provider the possible side effects of the OCP you may be prescribed. When you start an OCP, be aware that it can take 2-3 months for your body to adjust to changes in hormone levels. How to take oral contraceptive pills Follow instructions from your health care provider about how to start taking your first cycle of OCPs. Your health care provider may recommend that you:  Start the pill on day 1 of your menstrual period. If you start at this time, you will not need any backup form of birth control (contraception), such as condoms.  Start the pill on the first Sunday after your menstrual period or on the day you get your prescription. In these cases, you will need to use backup contraception for the first week.  Start the pill at any time of your cycle. ? If you take the pill within 5 days of the start of your period, you will not need a backup form of contraception. ? If you start at any other time of your menstrual cycle, you will need to use another form of contraception for 7 days. If your OCP is the type called a minipill, it will protect you from pregnancy after taking it for 2 days (48 hours), and you can stop using backup contraception after that time. After you have started taking OCPs:  If you forget to take 1 pill, take it as soon as you remember. Take the next pill at the regular time.  If you miss 2 or more pills, call your health care provider. Different pills have different instructions for missed doses. Use backup birth control until your next menstrual period starts.  If you use a 28-day pack that contains inactive pills and you miss 1 of the last 7 pills (pills with no hormones), throw away the rest of the non-hormone pills and start a new pill pack. No matter which day you start the OCP, you will always start a new pack on that same day of the week. Have an extra pack of OCPs and a backup  contraceptive method available in case you miss some pills or lose your OCP pack. Follow these instructions at home:  Do not use any products that contain nicotine or tobacco, such as cigarettes and e-cigarettes. If you need help quitting, ask your health care provider.  Always use a condom to protect against STIs. OCPs do not protect against STIs.  Use a calendar to mark the days of your menstrual period.  Read the information and directions that came with your OCP. Talk to your health care provider if you have questions. Contact a  health care provider if:  You develop nausea and vomiting.  You have abnormal vaginal discharge or bleeding.  You develop a rash.  You miss your menstrual period. Depending on the type of OCP you are taking, this may be a sign of pregnancy. Ask your health care provider for more information.  You are losing your hair.  You need treatment for mood swings or depression.  You get dizzy when taking the OCP.  You develop acne after taking the OCP.  You become pregnant or think you may be pregnant.  You have diarrhea, constipation, and abdominal pain or cramps.  You miss 2 or more pills. Get help right away if:  You develop chest pain.  You develop shortness of breath.  You have an uncontrolled or severe headache.  You develop numbness or slurred speech.  You develop visual or speech problems.  You develop pain, redness, and swelling in your legs.  You develop weakness or numbness in your arms or legs. Summary  Oral contraceptive pills (OCPs) are medicines that you take to prevent pregnancy.  OCPs do not prevent sexually transmitted infections (STIs). Always use a condom to protect against STIs.  When you start an OCP, be aware that it can take 2-3 months for your body to adjust to changes in hormone levels.  Read all the information and directions that come with your OCP. This information is not intended to replace advice given to you  by your health care provider. Make sure you discuss any questions you have with your health care provider. Document Revised: 07/07/2018 Document Reviewed: 04/26/2016 Elsevier Patient Education  [employer]2020 Elsevier Inc. This is  Dr. Melina Schoolsullo's  example of a  "Low GI"  Diet:  It will allow you to lose 4 to 8  lbs  per month if you follow it carefully.  Your goal with exercise is a minimum of 30 minutes of aerobic exercise 5 days per week (Walking does not count once it becomes easy!)    All of the foods can be found at grocery stores and in bulk at [employer].  The Atkins protein bars and shakes are available in more varieties at Target, WalMart and [employer]'s Foods.     7 AM Breakfast:  Choose from the following:  Low carbohydrate Protein  Shakes (I recommend the  Premier Protein chocolate shakes,  EAS AdvantEdge "Carb Control" shakes  Or the Atkins shakes all are under 3 net carbs)     a scrambled egg/bacon/cheese burrito made with [employer]'s "carb balance" whole wheat tortilla  (about 10 net carbs )  Medical laboratory scientific officerJimmy Deans sells microwaveable frittata (basically a quiche without the pastry crust) that is eaten cold and very convenient way to get your eggs.  8 carbs)  If you make your own protein shakes, avoid bananas and pineapple,  And use low carb greek yogurt or original /unsweetened almond or soy milk    Avoid cereal and bananas, oatmeal and cream of wheat and grits. They are loaded with carbohydrates!   10 AM: high protein snack:  Protein bar by Atkins (the snack size, under 200 cal, usually < 6 net carbs).    A stick of cheese:  Around 1 carb,  100 cal     Dannon Light n Fit [place] Yogurt  (80 cal, 8 carbs)  Other so called "protein bars" and Greek yogurts tend to be loaded with carbohydrates.  Remember, in food advertising, the word "energy" is synonymous for " carbohydrate."  Lunch:  A Sandwich using the bread choices listed, Can use any  Eggs,  lunchmeat, grilled meat or canned tuna), avocado, regular  mayo/mustard  and cheese.  A Salad using blue cheese, ranch,  Goddess or vinagrette,  Avoid taco shells, croutons or "confetti" and no "candied nuts" but regular nuts OK.   No pretzels, nabs  or chips.  Pickles and miniature sweet peppers are a good low carb alternative that provide a "crunch"  The bread is the only source of carbohydrate in a sandwich and  can be decreased by trying some of the attached alternatives to traditional loaf bread   Avoid "Low fat dressings, as well as Reyne Dumas and [employer] dressings They are loaded with sugar!   3 PM/ Mid day  Snack:  Consider  1 ounce of  almonds, walnuts, pistachios, pecans, peanuts,  Macadamia nuts or a nut medley.  Avoid "granola and granola bars "  Mixed nuts are ok in moderation as long as there are no raisins,  cranberries or dried fruit.   KIND bars are OK if you get the low glycemic index variety   Try the prosciutto/mozzarella cheese sticks by Fiorruci  In deli /backery section   High protein      6 PM  Dinner:     Meat/fowl/fish with a green salad, and either broccoli, cauliflower, green beans, spinach, brussel sprouts or  Lima beans. DO NOT BREAD THE PROTEIN!!      There is a low carb pasta by Dreamfield's that is acceptable and tastes great: only 5 digestible carbs/serving.( All grocery stores but BJs carry it ) Several ready made meals are available low carb:   Try Michel Angelo's chicken piccata or chicken or eggplant parm over low carb pasta.(Lowes and BJs)   Clifton Custard Sanchez's "Carnitas" (pulled pork, no sauce,  0 carbs) or his beef pot roast to make a dinner burrito (at [employer])  Pesto over low carb pasta (bj's sells a good quality pesto in the center refrigerated section of the deli   Try satueeing  [place]velt Harps with mushroooms as a good side   [employer] makes a mashed cauliflower that tastes like mashed potatoes  Whole wheat pasta is still full of digestible carbs and  Not as low in glycemic index as Dreamfield's.   Brown  rice is still rice,  So skip the rice and noodles if you eat [place] or [place] (or at least limit to 1/2 cup)  9 PM snack :   Breyer's "low carb" fudgsicle or  ice cream bar (Carb Smart line), or  Weight Watcher's ice cream bar , or another "no sugar added" ice cream;  a serving of fresh berries/cherries with whipped cream   Cheese or DANNON'S LlGHT N FIT GREEK YOGURT  8 ounces of [place] unsweetened almond/cococunut milk    Treat yourself to a parfait made with whipped cream blueberiies, walnuts and vanilla greek yogurt  Avoid bananas, pineapple, grapes  and watermelon on a regular basis because they are high in sugar.  THINK OF THEM AS DESSERT  Remember that snack Substitutions should be less than 10 NET carbs per serving and meals < 20 carbs. Remember to subtract fiber grams to get the "net carbs."  @TULLOBREADPACKAGE @

## 2019-11-05 ENCOUNTER — Other Ambulatory Visit: Payer: Self-pay | Admitting: Family

## 2019-11-05 DIAGNOSIS — E559 Vitamin D deficiency, unspecified: Secondary | ICD-10-CM

## 2019-11-05 DIAGNOSIS — D619 Aplastic anemia, unspecified: Secondary | ICD-10-CM

## 2019-11-05 LAB — HEPATITIS C ANTIBODY
Hepatitis C Ab: NONREACTIVE
SIGNAL TO CUT-OFF: 0.01 (ref <1.00)

## 2019-11-05 LAB — CYTOLOGY - PAP
Comment: NEGATIVE
Diagnosis: NEGATIVE
High risk HPV: NEGATIVE

## 2019-11-05 MED ORDER — CHOLECALCIFEROL 1.25 MG (50000 UT) PO TABS: ORAL_TABLET | ORAL | 0 refills | Status: DC

## 2019-11-08 ENCOUNTER — Telehealth: Payer: Self-pay

## 2019-11-08 NOTE — Telephone Encounter (Signed)
Prior Authorization for 18MG /3ML Pen-injectors has been submitted to CoverMyMeds.

## 2019-11-13 ENCOUNTER — Other Ambulatory Visit: Payer: Self-pay

## 2019-11-13 ENCOUNTER — Other Ambulatory Visit (INDEPENDENT_AMBULATORY_CARE_PROVIDER_SITE_OTHER): Payer: BC Managed Care – PPO

## 2019-11-13 DIAGNOSIS — D619 Aplastic anemia, unspecified: Secondary | ICD-10-CM

## 2019-11-14 LAB — CBC WITH DIFFERENTIAL/PLATELET
Basophils Absolute: 0.1 10*3/uL (ref 0.0–0.1)
Basophils Relative: 0.5 % (ref 0.0–3.0)
Eosinophils Absolute: 0.3 10*3/uL (ref 0.0–0.7)
Eosinophils Relative: 2.1 % (ref 0.0–5.0)
HCT: 36.2 % (ref 36.0–46.0)
Hemoglobin: 11.8 g/dL — ABNORMAL LOW (ref 12.0–15.0)
Lymphocytes Relative: 23.9 % (ref 12.0–46.0)
Lymphs Abs: 2.9 10*3/uL (ref 0.7–4.0)
MCHC: 32.5 g/dL (ref 30.0–36.0)
MCV: 85.3 fl (ref 78.0–100.0)
Monocytes Absolute: 0.6 10*3/uL (ref 0.1–1.0)
Monocytes Relative: 5.2 % (ref 3.0–12.0)
Neutro Abs: 8.2 10*3/uL — ABNORMAL HIGH (ref 1.4–7.7)
Neutrophils Relative %: 68.3 % (ref 43.0–77.0)
Platelets: 375 10*3/uL (ref 150.0–400.0)
RBC: 4.25 Mil/uL (ref 3.87–5.11)
RDW: 13.6 % (ref 11.5–15.5)
WBC: 11.9 10*3/uL — ABNORMAL HIGH (ref 4.0–10.5)

## 2019-11-14 LAB — IBC + FERRITIN
Ferritin: 47.4 ng/mL (ref 10.0–291.0)
Iron: 76 ug/dL (ref 42–145)
Saturation Ratios: 16.8 % — ABNORMAL LOW (ref 20.0–50.0)
Transferrin: 323 mg/dL (ref 212.0–360.0)

## 2019-11-22 ENCOUNTER — Telehealth: Payer: Self-pay

## 2019-11-22 NOTE — Telephone Encounter (Signed)
LMTCB for labs results. 

## 2019-12-17 ENCOUNTER — Encounter: Payer: Self-pay | Admitting: Family

## 2019-12-17 ENCOUNTER — Ambulatory Visit (INDEPENDENT_AMBULATORY_CARE_PROVIDER_SITE_OTHER): Payer: BC Managed Care – PPO | Admitting: Family

## 2019-12-17 VITALS — Ht 64.0 in | Wt 272.8 lb

## 2019-12-17 DIAGNOSIS — D72829 Elevated white blood cell count, unspecified: Secondary | ICD-10-CM | POA: Diagnosis not present

## 2019-12-17 DIAGNOSIS — Z6841 Body Mass Index (BMI) 40.0 and over, adult: Secondary | ICD-10-CM

## 2019-12-17 NOTE — Assessment & Plan Note (Signed)
Appears chronic when reviewing labs back to 2018, WBC 10.8. Pending referral to hematology for further evaluation.

## 2019-12-17 NOTE — Patient Instructions (Addendum)
Referral to hematology and  and wellness.   Let us know if you dont hear back within a week in regards to an appointment being scheduled.    Nice to see you!

## 2019-12-17 NOTE — Progress Notes (Signed)
Virtual Visit via Video Note  I connected with@  on 12/18/19 at  4:00 PM EDT by a video enabled telemedicine application and verified that I am speaking with the correct person using two identifiers.  Location patient: work - Nurse, adult  Persons participating in the virtual visit: patient, provider  I discussed the limitations of evaluation and management by telemedicine and the availability of in person appointments. The patient expressed understanding and agreed to proceed.   HPI: Feels well today No new complaints.   Anemia- started on iron supplement 6 weeks ago, however unsure dose . Energy improved.   Weight loss- started saxenda 6 weeks ago and gradually increased medication 3mg  last week administering in the abdomen. Holds syringe there after administration of medicine. She hasnt felt fullness and wanders if because she is giving at night. Interested in counseling as it relates to food.    ROS: See pertinent positives and negatives per HPI.    EXAM:  VITALS per patient if applicable: Wt Readings from Last 3 Encounters:  12/17/19 272 lb 12.8 oz (123.7 kg)  11/02/19 274 lb (124.3 kg)  08/29/18 265 lb 6.4 oz (120.4 kg)    GENERAL: alert, oriented, appears well and in no acute distress  HEENT: atraumatic, conjunttiva clear, no obvious abnormalities on inspection of external nose and ears  NECK: normal movements of the head and neck  LUNGS: on inspection no signs of respiratory distress, breathing rate appears normal, no obvious gross SOB, gasping or wheezing  CV: no obvious cyanosis  MS: moves all visible extremities without noticeable abnormality  PSYCH/NEURO: pleasant and cooperative, no obvious depression or anxiety, speech and thought processing grossly intact  ASSESSMENT AND PLAN:  Discussed the following assessment and plan:  Problem List Items Addressed This Visit      Other   BMI 45.0-49.9, adult (HCC) - Primary    Weight loss of  2 lbs, which understandably is disappointing to patient. Advised to start saxenda in the morning.  Referral to karen be[facility] for further evaluation and availability of counseling. Will follow.       Relevant Orders   Amb Ref to Medical Weight Management   Elevated WBC count    Appears chronic when reviewing labs back to 2018, WBC 10.8. Pending referral to hematology for further evaluation.       Relevant Orders   Ambulatory referral to Hematology      -we discussed possible serious and likely etiologies, options for evaluation and workup, limitations of telemedicine visit vs in person visit, treatment, treatment risks and precautions. Pt prefers to treat via telemedicine empirically rather then risking or undertaking an in person visit at this moment.  .   I discussed the assessment and treatment plan with the patient. The patient was provided an opportunity to ask questions and all were answered. The patient agreed with the plan and demonstrated an understanding of the instructions.   The patient was advised to call back or seek an in-person evaluation if the symptoms worsen or if the condition fails to improve as anticipated.   08-22-1981, FNP

## 2019-12-17 NOTE — Assessment & Plan Note (Addendum)
Weight loss of 2 lbs, which understandably is disappointing to patient. Advised to start saxenda in the morning.  Referral to karen be[facility] for further evaluation and availability of counseling. Will follow.

## 2019-12-19 ENCOUNTER — Encounter (INDEPENDENT_AMBULATORY_CARE_PROVIDER_SITE_OTHER): Payer: Self-pay

## 2019-12-27 ENCOUNTER — Inpatient Hospital Stay: Payer: BC Managed Care – PPO | Admitting: Oncology

## 2019-12-27 ENCOUNTER — Inpatient Hospital Stay: Payer: BC Managed Care – PPO

## 2019-12-27 ENCOUNTER — Telehealth: Payer: Self-pay | Admitting: Oncology

## 2019-12-27 NOTE — Telephone Encounter (Signed)
Writer was asked by MD to move patient's appt from today to another day. Writer spoke with patient and moved patient's appt to 01-01-20. Patient is agreeable with this move.

## 2020-01-01 ENCOUNTER — Encounter: Payer: Self-pay | Admitting: Oncology

## 2020-01-01 ENCOUNTER — Inpatient Hospital Stay: Payer: BC Managed Care – PPO | Admitting: Oncology

## 2020-01-01 ENCOUNTER — Telehealth: Payer: Self-pay | Admitting: Oncology

## 2020-01-01 ENCOUNTER — Inpatient Hospital Stay: Payer: BC Managed Care – PPO

## 2020-01-01 NOTE — Telephone Encounter (Signed)
Patient did not attend appt on this date. Writer phoned patient and left voicemail to reschedule. Writer also mailed patient a letter [facility].

## 2020-01-10 ENCOUNTER — Telehealth: Payer: Self-pay | Admitting: Family

## 2020-01-10 NOTE — Telephone Encounter (Signed)
"  Rejection Reason - Patient Declined - pt has cancel appt twice and has not reschedule appt" [facility] said on Jan 09, 2020 8:45 AM

## 2020-01-11 NOTE — Telephone Encounter (Signed)
Call pt Please reiterate the importance of rescheduling her appt with hematology with elevated wbcs Appears she hasnt been yet

## 2020-01-11 NOTE — Telephone Encounter (Signed)
LMTCB

## 2020-01-17 NOTE — Telephone Encounter (Signed)
LMTCB

## 2020-01-29 ENCOUNTER — Encounter: Payer: Self-pay | Admitting: Oncology

## 2020-01-29 ENCOUNTER — Telehealth: Payer: Self-pay | Admitting: Family

## 2020-01-29 ENCOUNTER — Inpatient Hospital Stay: Payer: BC Managed Care – PPO | Attending: Oncology | Admitting: Oncology

## 2020-01-29 ENCOUNTER — Inpatient Hospital Stay: Payer: BC Managed Care – PPO

## 2020-01-29 VITALS — BP 129/78 | HR 87 | Temp 98.2°F | Resp 18 | Wt 282.1 lb

## 2020-01-29 DIAGNOSIS — Z793 Long term (current) use of hormonal contraceptives: Secondary | ICD-10-CM

## 2020-01-29 DIAGNOSIS — D649 Anemia, unspecified: Secondary | ICD-10-CM | POA: Diagnosis not present

## 2020-01-29 DIAGNOSIS — Z823 Family history of stroke: Secondary | ICD-10-CM

## 2020-01-29 DIAGNOSIS — D729 Disorder of white blood cells, unspecified: Secondary | ICD-10-CM

## 2020-01-29 DIAGNOSIS — Z833 Family history of diabetes mellitus: Secondary | ICD-10-CM

## 2020-01-29 DIAGNOSIS — E559 Vitamin D deficiency, unspecified: Secondary | ICD-10-CM | POA: Diagnosis not present

## 2020-01-29 DIAGNOSIS — D72829 Elevated white blood cell count, unspecified: Secondary | ICD-10-CM | POA: Diagnosis not present

## 2020-01-29 DIAGNOSIS — Z79899 Other long term (current) drug therapy: Secondary | ICD-10-CM

## 2020-01-29 LAB — CBC WITH DIFFERENTIAL/PLATELET
Abs Immature Granulocytes: 0.03 10*3/uL (ref 0.00–0.07)
Basophils Absolute: 0 10*3/uL (ref 0.0–0.1)
Basophils Relative: 0 %
Eosinophils Absolute: 0.2 10*3/uL (ref 0.0–0.5)
Eosinophils Relative: 1 %
HCT: 33.6 % — ABNORMAL LOW (ref 36.0–46.0)
Hemoglobin: 11.2 g/dL — ABNORMAL LOW (ref 12.0–15.0)
Immature Granulocytes: 0 %
Lymphocytes Relative: 22 %
Lymphs Abs: 2.9 10*3/uL (ref 0.7–4.0)
MCH: 28.2 pg (ref 26.0–34.0)
MCHC: 33.3 g/dL (ref 30.0–36.0)
MCV: 84.6 fL (ref 80.0–100.0)
Monocytes Absolute: 0.6 10*3/uL (ref 0.1–1.0)
Monocytes Relative: 5 %
Neutro Abs: 9.5 10*3/uL — ABNORMAL HIGH (ref 1.7–7.7)
Neutrophils Relative %: 72 %
Platelets: 400 10*3/uL (ref 150–400)
RBC: 3.97 MIL/uL (ref 3.87–5.11)
RDW: 12.9 % (ref 11.5–15.5)
WBC: 13.2 10*3/uL — ABNORMAL HIGH (ref 4.0–10.5)
nRBC: 0 % (ref 0.0–0.2)

## 2020-01-29 LAB — SEDIMENTATION RATE: Sed Rate: 58 mm/hr — ABNORMAL HIGH (ref 0–20)

## 2020-01-29 LAB — VITAMIN B12: Vitamin B-12: 250 pg/mL (ref 180–914)

## 2020-01-29 LAB — FOLATE: Folate: 14.1 ng/mL (ref >5.9)

## 2020-01-29 NOTE — Telephone Encounter (Signed)
Pt would like a call back about her last appt  Pt didn't give any details

## 2020-01-31 NOTE — Telephone Encounter (Signed)
Pt has questions in regards to lab results that were ordered by Dr. Smith Robert. I advised that if she does not hear within the next day that she reach out or mychart Dr. Smith Robert.

## 2020-02-03 ENCOUNTER — Encounter: Payer: Self-pay | Admitting: Oncology

## 2020-02-03 NOTE — Progress Notes (Signed)
Hematology/Oncology Consult note [facility] [phone] [phone]  Patient Care Team: Burnard Hawthorne, FNP as PCP - General (Family Medicine)   Name of the patient: Cassandra Vazquez  [id number]  Mar 20, 1986    Reason for referral- leucocytosis   Referring physician- Mable Paris FNP  Date of visit: 02/03/20   History of presenting illness- Patient is a 34 year old female referred for leukocytosis. Patient's most recent CBC from 11/02/2019 showed white count of 11.2, H&H of 11.6/35 with an MCV of 85 and a platelet count of 439. Iron studies at that time were normal. CMP hepatitis C testing and hemoglobin A1c normal. Looking back at her prior CBCs patient's white cell count has been typically between 9-10. Since March 2020 it has been fluctuating around 11-12. Differential mainly shows neutrophilia with an ANC of 9.5. Her hemoglobin has been mostly stable around 11-12. Patient reported her appetite and weight have remained stable. Denies any recurrent infections or hospitalizations.  ECOG PS- 1  Pain scale- 0   Review of systems- Review of Systems  Constitutional: Negative for chills, fever, malaise/fatigue and weight loss.  HENT: Negative for congestion, ear discharge and nosebleeds.   Eyes: Negative for blurred vision.  Respiratory: Negative for cough, hemoptysis, sputum production, shortness of breath and wheezing.   Cardiovascular: Negative for chest pain, palpitations, orthopnea and claudication.  Gastrointestinal: Negative for abdominal pain, blood in stool, constipation, diarrhea, heartburn, melena, nausea and vomiting.  Genitourinary: Negative for dysuria, flank pain, frequency, hematuria and urgency.  Musculoskeletal: Negative for back pain, joint pain and myalgias.  Skin: Negative for rash.  Neurological: Negative for dizziness, tingling, focal weakness, seizures, weakness and headaches.  Endo/Heme/Allergies: Does not  bruise/bleed easily.  Psychiatric/Behavioral: Negative for depression and suicidal ideas. The patient does not have insomnia.     No Known Allergies  Patient Active Problem List   Diagnosis Date Noted   Elevated WBC count 12/17/2019   Birth control counseling 11/02/2019   Vitamin D deficiency 02/22/2018   Depression, recurrent ([place]) 09/02/2017   Routine physical examination 11/08/2016   History of gestational diabetes in prior pregnancy, currently pregnant 10/22/2016   BMI 45.0-49.9, adult ([place]) 10/21/2016     Past Medical History:  Diagnosis Date   Anemia    History of A2 gestational diabetes 10/22/2016   Negative postpartum 2 hr GTT     History reviewed. No pertinent surgical history.  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   From [place]    Married   4 children- 7,5,2,1   Social Determinants of Health   Financial Resource Strain:    Difficulty of Paying Living Expenses: Not on file  Food Insecurity:    Worried About Charity fundraiser in the Last Year: Not on file   [employer] of Food in the Last Year:  Not on file  Transportation Needs:    Lack of Transportation (Medical): Not on file   Lack of Transportation (Non-Medical): Not on file  Physical Activity:    Days of Exercise per Week: Not on file   Minutes of Exercise per Session: Not on file  Stress:    Feeling of Stress : Not on file  Social Connections:      Frequency of Communication with Friends and Family: Not on file   Frequency of Social Gatherings with Friends and Family: Not on file   Attends Religious Services: Not on file   Active Member of Clubs or Organizations: Not on file   Attends Archivist Meetings: Not on file   Marital Status: Not on file  Intimate Partner Violence:    Fear of Current or Ex-Partner: Not on file   Emotionally Abused: Not on file   Physically Abused: Not on file   Sexually Abused: Not on file     Family History  Problem Relation Age of Onset   Diabetes Mother    Diabetes Father    CVA Father    Stroke Father    Colon cancer Neg Hx    Breast cancer Neg Hx    Thyroid cancer Neg Hx      Current Outpatient Medications:    Cholecalciferol 1.25 MG (50000 UT) TABS, 50,000 units PO qwk for 8 weeks. (Patient not taking: Reported on 01/29/2020), Disp: 8 tablet, Rfl: 0   levonorgestrel-ethinyl estradiol (ALESSE) 0.1-20 MG-MCG tablet, Take 1 tablet by mouth daily. (Patient not taking: Reported on 01/29/2020), Disp: 31 tablet, Rfl: 11   Liraglutide -Weight Management (SAXENDA) 18 MG/3ML SOPN, Inject 0.1 mLs (0.6 mg total) into the skin daily. (Patient not taking: Reported on 01/29/2020), Disp: 3 mL, Rfl: 4   Physical exam:  Vitals:   01/29/20 1442  BP: 129/78  Pulse: 87  Resp: 18  Temp: 98.2 F (36.8 C)  TempSrc: Tympanic  SpO2: 99%  Weight: 282 lb 1 oz (127.9 kg)   Physical Exam Constitutional:      General: She is not in acute distress. Eyes:     Pupils: Pupils are equal, round, and reactive to light.  Cardiovascular:     Rate and Rhythm: Normal rate and regular rhythm.     Heart sounds: Normal heart sounds.  Pulmonary:     Effort: Pulmonary effort is normal.     Breath sounds: Normal breath sounds.  Abdominal:     General: Bowel sounds are normal.     Palpations: Abdomen is soft.     Comments: No palpable hepatosplenomegaly  Lymphadenopathy:     Comments: No  palpable cervical, supraclavicular, axillary or inguinal adenopathy   Skin:    General: Skin is warm and dry.  Neurological:     Mental Status: She is alert and oriented to person, place, and time.        CMP Latest Ref Rng & Units 11/02/2019  Glucose 70 - 99 mg/dL 81  BUN 6 - 23 mg/dL 13  Creatinine 0.40 - 1.20 mg/dL 0.59  Sodium 135 - 145 mEq/L 138  Potassium 3.5 - 5.1 mEq/L 4.5  Chloride 96 - 112 mEq/L 104  CO2 19 - 32 mEq/L 27  Calcium 8.4 - 10.5 mg/dL 9.3  Total Protein 6.0 - 8.3 g/dL 7.2  Total Bilirubin 0.2 - 1.2 mg/dL 0.3  Alkaline Phos 39 - 117 U/L 61  AST 0 - 37 U/L 14  ALT 0 - 35 U/L 15  CBC Latest Ref Rng & Units 01/29/2020  WBC 4.0 - 10.5 K/uL 13.2(H)  Hemoglobin 12.0 - 15.0 g/dL 11.2(L)  Hematocrit 36 - 46 % 33.6(L)  Platelets 150 - 400 K/uL 400    Assessment and plan- Patient is a 34 y.o. female referred for leukocytosis mainly neutrophilia  Neutrophilia: Patient's white count is mildly elevated Between 11-12. Differential mainly showed neutrophilia. Given the neutrophilia is mild presently I'm not inclined to do a bone marrow biopsy. There is a consistent increase in her white cell count I will consider doing it down the line. We'll check ESR today.  Anemia: Again this is mild with a hemoglobin between 11-12. Recent iron studies and TSH was normal. I will check a M35 and folic acid level today.  I will see her back in 2 weeks for a video visit Thank you for this kind referral and the opportunity to participate in the care of this patient   Visit Diagnosis 1. Neutrophilia   2. Normocytic anemia     Dr. Randa Evens, MD, MPH [facility] at [facility] [phone] 02/03/2020 2:07 PM

## 2020-02-14 ENCOUNTER — Other Ambulatory Visit: Payer: Self-pay

## 2020-02-14 ENCOUNTER — Inpatient Hospital Stay (HOSPITAL_BASED_OUTPATIENT_CLINIC_OR_DEPARTMENT_OTHER): Payer: BC Managed Care – PPO | Admitting: Oncology

## 2020-02-14 DIAGNOSIS — D729 Disorder of white blood cells, unspecified: Secondary | ICD-10-CM | POA: Diagnosis not present

## 2020-02-14 DIAGNOSIS — D649 Anemia, unspecified: Secondary | ICD-10-CM

## 2020-02-17 NOTE — Progress Notes (Signed)
I connected with Cassandra Vazquez on 02/17/20 at  2:30 PM EST by video enabled telemedicine visit and verified that I am speaking with the correct person using two identifiers.   I discussed the limitations, risks, security and privacy concerns of performing an evaluation and management service by telemedicine and the availability of in-person appointments. I also discussed with the patient that there may be a patient responsible charge related to this service. The patient expressed understanding and agreed to proceed.  Other persons participating in the visit and their role in the encounter:  none  Patient's location:  home Provider's location:  work  Risk analyst Complaint: Discuss results of blood work   History of present illness: Patient is a 34 year old female referred for leukocytosis. Patient's most recent CBC from 11/02/2019 showed white count of 11.2, H&H of 11.6/35 with an MCV of 85 and a platelet count of 439. Iron studies at that time were normal. CMP hepatitis C testing and hemoglobin A1c normal. Looking back at her prior CBCs patient's white cell count has been typically between 9-10. Since March 2020 it has been fluctuating around 11-12. Differential mainly shows neutrophilia with an ANC of 9.5. Her hemoglobin has been mostly stable around 11-12. Interval history: Patient is currently doing well and no new complaints since her last visit   Review of Systems  Constitutional: Negative for chills, fever, malaise/fatigue and weight loss.  HENT: Negative for congestion, ear discharge and nosebleeds.   Eyes: Negative for blurred vision.  Respiratory: Negative for cough, hemoptysis, sputum production, shortness of breath and wheezing.   Cardiovascular: Negative for chest pain, palpitations, orthopnea and claudication.  Gastrointestinal: Negative for abdominal pain, blood in stool, constipation, diarrhea, heartburn, melena, nausea and vomiting.  Genitourinary: Negative for dysuria, flank pain,  frequency, hematuria and urgency.  Musculoskeletal: Negative for back pain, joint pain and myalgias.  Skin: Negative for rash.  Neurological: Negative for dizziness, tingling, focal weakness, seizures, weakness and headaches.  Endo/Heme/Allergies: Does not bruise/bleed easily.  Psychiatric/Behavioral: Negative for depression and suicidal ideas. The patient does not have insomnia.     No Known Allergies  Past Medical History:  Diagnosis Date  . Anemia   . History of A2 gestational diabetes 10/22/2016   Negative postpartum 2 hr GTT    No past surgical history on file.  Social History   Socioeconomic History  . Marital status: Married    Spouse name: Not on file  . Number of children: Not on file  . Years of education: Not on file  . Highest education level: Not on file  Occupational History  . Not on file  Tobacco Use  . Smoking status: Never Smoker  . Smokeless tobacco: Never Used  Vaping Use  . Vaping Use: Never used  Substance and Sexual Activity  . Alcohol use: Yes    Comment: wine one glass per night  . Drug use: No  . Sexual activity: Yes  Other Topics Concern  . 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 Difficulty of Paying Living Expenses: Not on file  Food Insecurity:   . Worried About Charity fundraiser in the Last Year: Not on file  . Ran Out of Food in the Last Year: Not on file  Transportation Needs:   . Lack of Transportation (Medical): Not on file  .  Lack of Transportation (Non-Medical): Not on file  Physical Activity:   . Days of Exercise per Week: Not on file  . Minutes of Exercise per Session: Not on file  Stress:   . Feeling of Stress : Not on file  Social Connections:   . Frequency of Communication with Friends and Family: Not on file  . Frequency of Social Gatherings with Friends and Family: Not on file   . Attends Religious Services: Not on file  . Active Member of Clubs or Organizations: Not on file  . Attends Archivist Meetings: Not on file  . Marital Status: Not on file  Intimate Partner Violence:   . Fear of Current or Ex-Partner: Not on file  . Emotionally Abused: Not on file  . Physically Abused: Not on file  . Sexually Abused: Not on file    Family History  Problem Relation Age of Onset  . Diabetes Mother   . Diabetes Father   . CVA Father   . Stroke Father   . Colon cancer Neg Hx   . Breast cancer Neg Hx   . Thyroid cancer Neg Hx      Current Outpatient Medications:  .  levonorgestrel-ethinyl estradiol (ALESSE) 0.1-20 MG-MCG tablet, Take 1 tablet by mouth daily., Disp: 31 tablet, Rfl: 11 .  Cholecalciferol 1.25 MG (50000 UT) TABS, 50,000 units PO qwk for 8 weeks. (Patient not taking: Reported on 01/29/2020), Disp: 8 tablet, Rfl: 0 .  Liraglutide -Weight Management (SAXENDA) 18 MG/3ML SOPN, Inject 0.1 mLs (0.6 mg total) into the skin daily. (Patient not taking: Reported on 01/29/2020), Disp: 3 mL, Rfl: 4  No results found.  No images are attached to the encounter.   CMP Latest Ref Rng & Units 11/02/2019  Glucose 70 - 99 mg/dL 81  BUN 6 - 23 mg/dL 13  Creatinine 0.40 - 1.20 mg/dL 0.59  Sodium 135 - 145 mEq/L 138  Potassium 3.5 - 5.1 mEq/L 4.5  Chloride 96 - 112 mEq/L 104  CO2 19 - 32 mEq/L 27  Calcium 8.4 - 10.5 mg/dL 9.3  Total Protein 6.0 - 8.3 g/dL 7.2  Total Bilirubin 0.2 - 1.2 mg/dL 0.3  Alkaline Phos 39 - 117 U/L 61  AST 0 - 37 U/L 14  ALT 0 - 35 U/L 15   CBC Latest Ref Rng & Units 01/29/2020  WBC 4.0 - 10.5 K/uL 13.2(H)  Hemoglobin 12.0 - 15.0 g/dL 11.2(L)  Hematocrit 36 - 46 % 33.6(L)  Platelets 150 - 400 K/uL 400     Observation/objective: Appears in no acute distress over video visit today.  Breathing is nonlabored  Assessment and plan:Patient is a 34 year old female referred for leukocytosis/neutrophilia: Neutropenia patient  continues to have mild neutrophilia and her white count presently is 13.2 with an ANC of 9.5.  Also has a mild anemia but hemoglobin is remained stable at around 11.  Platelet counts are normal.  ESR was elevated at 58.  B12 levels were low at 250 folate was normal.  Recent iron studies have been normal.  An elevated ESR point felt to an acute inflammatory state which could be possibly raising her white count.    Given the isolated neutrophilia and relatively stable anemia I am not inclined to perform a bone marrow biopsy at this time.  Will check CBC ferritin and iron studies B12 ESR and flow cytometry in 3 months followed by video visit 3 to 4 days later.  I have asked her to take oral B12  1000 mcg daily  Follow-up instructions:  I discussed the assessment and treatment plan with the patient. The patient was provided an opportunity to ask questions and all were answered. The patient agreed with the plan and demonstrated an understanding of the instructions.   The patient was advised to call back or seek an in-person evaluation if the symptoms worsen or if the condition fails to improve as anticipated.    Visit Diagnosis: 1. Neutrophilia   2. Normocytic anemia     Dr. Randa Evens, MD, MPH [facility] at [facility] Tel- [phone] 02/17/2020 3:55 PM

## 2020-04-09 ENCOUNTER — Other Ambulatory Visit: Payer: Self-pay

## 2020-04-09 ENCOUNTER — Ambulatory Visit (INDEPENDENT_AMBULATORY_CARE_PROVIDER_SITE_OTHER): Payer: BC Managed Care – PPO | Admitting: Family Medicine

## 2020-04-09 ENCOUNTER — Encounter (INDEPENDENT_AMBULATORY_CARE_PROVIDER_SITE_OTHER): Payer: Self-pay | Admitting: Family Medicine

## 2020-04-09 VITALS — BP 113/78 | HR 71 | Temp 98.1°F | Ht 64.0 in | Wt 273.0 lb

## 2020-04-09 DIAGNOSIS — R5383 Other fatigue: Secondary | ICD-10-CM | POA: Diagnosis not present

## 2020-04-09 DIAGNOSIS — D649 Anemia, unspecified: Secondary | ICD-10-CM

## 2020-04-09 DIAGNOSIS — E559 Vitamin D deficiency, unspecified: Secondary | ICD-10-CM

## 2020-04-09 DIAGNOSIS — Z1331 Encounter for screening for depression: Secondary | ICD-10-CM | POA: Diagnosis not present

## 2020-04-09 DIAGNOSIS — R0602 Shortness of breath: Secondary | ICD-10-CM | POA: Diagnosis not present

## 2020-04-09 DIAGNOSIS — E782 Mixed hyperlipidemia: Secondary | ICD-10-CM | POA: Diagnosis not present

## 2020-04-09 DIAGNOSIS — Z0289 Encounter for other administrative examinations: Secondary | ICD-10-CM

## 2020-04-09 DIAGNOSIS — R739 Hyperglycemia, unspecified: Secondary | ICD-10-CM

## 2020-04-09 DIAGNOSIS — Z6841 Body Mass Index (BMI) 40.0 and over, adult: Secondary | ICD-10-CM

## 2020-04-10 LAB — COMPREHENSIVE METABOLIC PANEL
ALT: 17 IU/L (ref 0–32)
AST: 20 IU/L (ref 0–40)
Albumin/Globulin Ratio: 1.4 (ref 1.2–2.2)
Albumin: 3.9 g/dL (ref 3.8–4.8)
Alkaline Phosphatase: 62 IU/L (ref 44–121)
BUN/Creatinine Ratio: 15 (ref 9–23)
BUN: 10 mg/dL (ref 6–20)
Bilirubin Total: 0.3 mg/dL (ref 0.0–1.2)
CO2: 19 mmol/L — ABNORMAL LOW (ref 20–29)
Calcium: 8.9 mg/dL (ref 8.7–10.2)
Chloride: 103 mmol/L (ref 96–106)
Creatinine, Ser: 0.65 mg/dL (ref 0.57–1.00)
GFR calc Af Amer: 136 mL/min/{1.73_m2} (ref >59)
GFR calc non Af Amer: 118 mL/min/{1.73_m2} (ref >59)
Globulin, Total: 2.8 g/dL (ref 1.5–4.5)
Glucose: 102 mg/dL — ABNORMAL HIGH (ref 65–99)
Potassium: 4.3 mmol/L (ref 3.5–5.2)
Sodium: 136 mmol/L (ref 134–144)
Total Protein: 6.7 g/dL (ref 6.0–8.5)

## 2020-04-10 LAB — CBC WITH DIFFERENTIAL/PLATELET
Basophils Absolute: 0 10*3/uL (ref 0.0–0.2)
Basos: 0 %
EOS (ABSOLUTE): 0.2 10*3/uL (ref 0.0–0.4)
Eos: 1 %
Hematocrit: 38.4 % (ref 34.0–46.6)
Hemoglobin: 12.3 g/dL (ref 11.1–15.9)
Immature Grans (Abs): 0 10*3/uL (ref 0.0–0.1)
Immature Granulocytes: 0 %
Lymphocytes Absolute: 2.5 10*3/uL (ref 0.7–3.1)
Lymphs: 23 %
MCH: 27.5 pg (ref 26.6–33.0)
MCHC: 32 g/dL (ref 31.5–35.7)
MCV: 86 fL (ref 79–97)
Monocytes Absolute: 0.5 10*3/uL (ref 0.1–0.9)
Monocytes: 4 %
Neutrophils Absolute: 7.8 10*3/uL — ABNORMAL HIGH (ref 1.4–7.0)
Neutrophils: 72 %
Platelets: 427 10*3/uL (ref 150–450)
RBC: 4.47 x10E6/uL (ref 3.77–5.28)
RDW: 12.7 % (ref 11.7–15.4)
WBC: 10.9 10*3/uL — ABNORMAL HIGH (ref 3.4–10.8)

## 2020-04-10 LAB — INSULIN, RANDOM: INSULIN: 10.3 u[IU]/mL (ref 2.6–24.9)

## 2020-04-10 LAB — VITAMIN D 25 HYDROXY (VIT D DEFICIENCY, FRACTURES): Vit D, 25-Hydroxy: 13 ng/mL — ABNORMAL LOW (ref 30.0–100.0)

## 2020-04-10 LAB — LIPID PANEL WITH LDL/HDL RATIO
Cholesterol, Total: 210 mg/dL — ABNORMAL HIGH (ref 100–199)
HDL: 48 mg/dL (ref >39)
LDL Chol Calc (NIH): 139 mg/dL — ABNORMAL HIGH (ref 0–99)
LDL/HDL Ratio: 2.9 ratio (ref 0.0–3.2)
Triglycerides: 130 mg/dL (ref 0–149)
VLDL Cholesterol Cal: 23 mg/dL (ref 5–40)

## 2020-04-10 LAB — HEMOGLOBIN A1C
Est. average glucose Bld gHb Est-mCnc: 114 mg/dL
Hgb A1c MFr Bld: 5.6 % (ref 4.8–5.6)

## 2020-04-10 LAB — TSH: TSH: 2.21 u[IU]/mL (ref 0.450–4.500)

## 2020-04-10 LAB — T3: T3, Total: 119 ng/dL (ref 71–180)

## 2020-04-10 LAB — T4: T4, Total: 7.1 ug/dL (ref 4.5–12.0)

## 2020-04-15 NOTE — Progress Notes (Signed)
Dear Cassandra Plowman, FNP,   Thank you for referring Cassandra Vazquez to our clinic. The following note includes my evaluation and treatment recommendations.  Chief Complaint:   OBESITY Cassandra Vazquez (MR# [id number]) is a 35 y.o. female who presents for evaluation and treatment of obesity and related comorbidities. Current BMI is Body mass index is 46.86 kg/m. Cassandra Vazquez has been struggling with her weight for many years and has been unsuccessful in either losing weight, maintaining weight loss, or reaching her healthy weight goal.  Charmon was referred by Cassandra Plowman, FNP. Seba wants to be vegan. She skips breakfast and will have a redbull in the morning (not hungry). Occasionally skipping lunch or for lunch, chipotle chicken burrito (feel full). No snacks between lunch and later evening. Around 9/10 pm, frozen egg rolls or [place] take out or leftovers from dinner.  Tram is currently in the action stage of change and ready to dedicate time achieving and maintaining a healthier weight. Zamaria is interested in becoming our patient and working on intensive lifestyle modifications including (but not limited to) diet and exercise for weight loss.  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  Depression Screen Cassandra Vazquez's Food and Mood (modified PHQ-9) score was 11.  Depression screen PHQ 2/9 04/09/2020  Decreased Interest 1  Down, Depressed, Hopeless 3  PHQ - 2 Score 4  Altered sleeping 0  Tired, decreased energy 3  Change in appetite 1  Feeling bad or failure about yourself  3  Trouble concentrating 0  Moving slowly or fidgety/restless 0  Suicidal thoughts 0  PHQ-9 Score 11  Difficult doing work/chores Somewhat difficult   Subjective:   1. Other fatigue Jailene admits to daytime somnolence and admits to waking up still tired. Patent has a history of symptoms of daytime fatigue and morning fatigue. Anniebell generally gets 6 or 7 hours of sleep per night, and states that she has difficulty falling asleep. Snoring is present. Apneic episodes is not present. ECG-NSR at 83 bpm  2. SOB (shortness of breath) on exertion Cassandra Vazquez notes increasing shortness of breath with exercising and seems to be worsening over time with weight gain. She notes getting out of breath sooner with activity than she used to. This has not gotten worse recently. Holy denies shortness of breath at rest or orthopnea.  3. Vitamin D deficiency Cassandra Vazquez is not on Vitamin D currently. She does note fatigue.  4. Other hyperlipidemia Cassandra Vazquez's LDL is 154, HDL 53, and Trig 109. She is not on statin.  5. Hyperglycemia Cassandra Vazquez's blood sugar was previously elevated. Last A1c was 5.4. She has a history of gestational diabetes.  6. Normocytic anemia Cassandra Vazquez sees Heme/Onc. She is not on MVL.  Assessment/Plan:  1. Other fatigue - EKG 12-Lead - TSH - T4 - T3  2. SOB (shortness of breath) on exertion - Lipid Panel With LDL/HDL Ratio  3. Vitamin D deficiency - VITAMIN D 25 Hydroxy (Vit-D Deficiency, Fractures)  4. Other hyperlipidemia Fasting Lipid Panel today.  5. Hyperglycemia - Comprehensive metabolic panel - Hemoglobin A1c -  Insulin, random  6. Normocytic anemia - CBC with Differential/Platelet  7. Depression screening Behavior modification techniques were discussed today to help Cassandra Vazquez deal with her emotional/non-hunger eating behaviors.  Orders and follow up as documented in patient record.   8. Class 3 severe obesity with serious comorbidity and body mass index (BMI) of 45.0 to 49.9 in adult, unspecified obesity type (HCC)  Cassandra Vazquez is currently in the action stage of change and her goal is to continue with weight loss efforts. I recommend Cassandra Vazquez begin the structured treatment plan as follows:  She has agreed to the [employer].  Exercise goals: No exercise has been prescribed at this time.   Behavioral modification strategies: increasing lean protein intake, no skipping meals, meal planning and cooking strategies, keeping healthy foods in the home and planning for success.  She was informed of the importance of frequent follow-up visits to maximize her success with intensive lifestyle modifications for her multiple health conditions. She was informed we would discuss her lab results at her next visit unless there is a critical issue that needs to be addressed sooner. Cassandra Vazquez agreed to keep her next visit at the agreed upon time to discuss these results.  Objective:   Blood pressure 113/78, pulse 71, temperature 98.1 F (36.7 C), temperature source Oral, height 5\' 4"  (1.626 m), weight 273 lb (123.8 kg), last menstrual period 03/28/2020, SpO2 98 %, unknown if currently breastfeeding. Body mass index is 46.86 kg/m.  EKG: Normal sinus rhythm, rate 83 bpm.  Indirect Calorimeter completed today shows a VO2 of 174 and a REE of 1208.  Her calculated basal metabolic rate is 03/30/2020 thus her basal metabolic rate is worse than expected.  General: Cooperative, alert, well developed, in no acute distress. HEENT: Conjunctivae and lids unremarkable. Cardiovascular: Regular rhythm.  Lungs: Normal work of  breathing. Neurologic: No focal deficits.   Lab Results  Component Value Date   CREATININE 0.65 04/09/2020   BUN 10 04/09/2020   NA 136 04/09/2020   K 4.3 04/09/2020   CL 103 04/09/2020   CO2 19 (L) 04/09/2020   Lab Results  Component Value Date   ALT 17 04/09/2020   AST 20 04/09/2020   ALKPHOS 62 04/09/2020   BILITOT 0.3 04/09/2020   Lab Results  Component Value Date   HGBA1C 5.6 04/09/2020   HGBA1C 5.4 11/02/2019   HGBA1C 5.4 02/21/2018   Lab Results  Component Value Date   INSULIN 10.3 04/09/2020   Lab Results  Component Value Date   TSH 2.210 04/09/2020   Lab Results  Component Value Date   CHOL 210 (H) 04/09/2020   HDL 48 04/09/2020   LDLCALC 139 (H) 04/09/2020   TRIG 130 04/09/2020   CHOLHDL 4 11/02/2019   Lab Results  Component Value Date   WBC 10.9 (H) 04/09/2020   HGB 12.3 04/09/2020   HCT 38.4 04/09/2020   MCV 86 04/09/2020   PLT 427 04/09/2020   Lab Results  Component Value Date   IRON 76 11/13/2019   FERRITIN 47.4 11/13/2019    Attestation Statements:   Reviewed by clinician on day of visit: allergies, medications, problem list, medical history,  surgical history, family history, social history, and previous encounter notes.   This is the patient's first visit at [facility]. The patient's NEW PATIENT PACKET was reviewed at length. Included in the packet: current and past health history, medications, allergies, ROS, gynecologic history (women only), surgical history, family history, social history, weight history, weight loss surgery history (for those that have had weight loss surgery), nutritional evaluation, mood and food questionnaire, PHQ9, Epworth questionnaire, sleep habits questionnaire, patient life and health improvement goals questionnaire. These will all be scanned into the patient's chart under media.   During the visit, I independently reviewed the patient's EKG, bioimpedance scale results, and indirect calorimeter  results. I used this information to tailor a meal plan for the patient that will help her to lose weight and will improve her obesity-related conditions going forward. I performed a medically necessary appropriate examination and/or evaluation. I discussed the assessment and treatment plan with the patient. The patient was provided an opportunity to ask questions and all were answered. The patient agreed with the plan and demonstrated an understanding of the instructions. Labs were ordered at this visit and will be reviewed at the next visit unless more critical results need to be addressed immediately. Clinical information was updated and documented in the EMR.   Time spent on visit including pre-visit chart review and post-visit care was 60 minutes.   A separate 15 minutes was spent on risk counseling (see above).    I, Delorse Limber, am acting as transcriptionist for Reuben Likes, MD. I have reviewed the above documentation for accuracy and completeness, and I agree with the above. - Katherina Mires, MD

## 2020-04-23 ENCOUNTER — Ambulatory Visit (INDEPENDENT_AMBULATORY_CARE_PROVIDER_SITE_OTHER): Payer: BC Managed Care – PPO | Admitting: Family Medicine

## 2020-04-23 ENCOUNTER — Encounter (INDEPENDENT_AMBULATORY_CARE_PROVIDER_SITE_OTHER): Payer: Self-pay | Admitting: Family Medicine

## 2020-04-23 ENCOUNTER — Other Ambulatory Visit: Payer: Self-pay

## 2020-04-23 VITALS — BP 119/79 | HR 86 | Temp 98.0°F | Ht 64.0 in | Wt 277.0 lb

## 2020-04-23 DIAGNOSIS — E559 Vitamin D deficiency, unspecified: Secondary | ICD-10-CM | POA: Diagnosis not present

## 2020-04-23 DIAGNOSIS — E8881 Metabolic syndrome: Secondary | ICD-10-CM

## 2020-04-23 DIAGNOSIS — Z9189 Other specified personal risk factors, not elsewhere classified: Secondary | ICD-10-CM | POA: Diagnosis not present

## 2020-04-23 DIAGNOSIS — Z6841 Body Mass Index (BMI) 40.0 and over, adult: Secondary | ICD-10-CM

## 2020-04-23 DIAGNOSIS — E7849 Other hyperlipidemia: Secondary | ICD-10-CM | POA: Diagnosis not present

## 2020-04-23 DIAGNOSIS — F3289 Other specified depressive episodes: Secondary | ICD-10-CM | POA: Diagnosis not present

## 2020-04-23 MED ORDER — VITAMIN D (ERGOCALCIFEROL) 1.25 MG (50000 UNIT) PO CAPS: 50000.0000 [IU] | ORAL_CAPSULE | ORAL | 0 refills | Status: DC

## 2020-04-23 MED ORDER — TOPIRAMATE 25 MG PO TABS: 25.0000 mg | ORAL_TABLET | Freq: Every day | ORAL | 0 refills | Status: DC

## 2020-04-24 NOTE — Progress Notes (Signed)
Chief Complaint:   OBESITY Cassandra Vazquez is here to discuss her progress with her obesity treatment plan along with follow-up of her obesity related diagnoses. Cassandra Vazquez is on the [employer] and states she is following her eating plan approximately 50% of the time. [name] states she is not exercising. Today's visit was #: 2 Starting weight: 273 lbs Starting date: 04/09/2020 Today's weight: 277 lbs Today's date: 04/23/2020 Total lbs lost to date: 0 Total lbs lost since last in-office visit: 0  Interim History: [name] voices her first 2 weeks were difficult. She found herself over eating secondary to wanting to eat later in the evening. She notes breakfast is easy and lunch is fine when she is at work, but at home it is not as on the plan. Dinner felt to be a good portion. She found virtual teaching to be sabotaging her following the plan. She didn't feel she was missing out on anything.  Subjective:   1. Vitamin D deficiency Cassandra Vazquez is not on Vitamin D supplement, but she does notes fatigue. I discussed labs with the patient today.  2. Insulin resistance Cassandra Vazquez has a new diagnosis of insulin resistance. Her A1c is 5.6 and insulin 10.3. She denies a history of this diagnosis. I discussed labs with the patient today.  3. Other hyperlipidemia Cassandra Vazquez has a new diagnosis of hyperlipidemia. Her LDL is of 139, HDL 48, and triglycerides 573. She is on not a statin. I discussed labs with the patient today.  4. 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 She denies suicidal or homicidal ideas. She is interested in a referral to Dr Dewaine Conger, our bariatric psychologist upon her return from maternity. She finds herself not able to control the urge to eat even if she is not hungry.  5. At risk for diabetes mellitus Cassandra Vazquez is at higher than average risk for developing diabetes due to obesity.  Assessment/Plan:   1. Vitamin D deficiency *Low  Vitamin D level contributes to fatigue and are associated with obesity, breast, and colon cancer. Cassandra Vazquez agreed to start prescription Vitamin D 50,000 IU every week with no refills. She will follow-up for routine testing of Vitamin D, at least 2-3 times per year to avoid over-replacement.   - Vitamin D, Ergocalciferol, (DRISDOL) 1.25 MG (50000 UNIT) CAPS capsule; Take 1 capsule (50,000 Units total) by mouth every 7 (seven) days.  Dispense: 4 capsule; Refill: 0  2. Insulin resistance Cassandra Vazquez will continue to work on weight loss, exercise, and decreasing simple carbohydrates to help decrease the risk of diabetes. We will repeat labs in 3 months, and no medications were prescribed at this time. Cassandra Vazquez agreed to follow-up with [place] as directed to closely monitor her progress.   3. Other hyperlipidemia Cardiovascular risk and specific lipid/LDL goals reviewed. We discussed several lifestyle modifications today. Cassandra Vazquez will continue to work on diet, exercise and weight loss efforts. We will repeat labs in 3 months, and no medications were prescribed at this time. Orders and follow up as documented in patient record.   Counseling Intensive lifestyle modifications are the first line treatment for this issue. . Dietary changes: Increase soluble fiber. Decrease simple carbohydrates. . Exercise changes: Moderate to vigorous-intensity aerobic activity 150 minutes per week if tolerated. . Lipid-lowering medications: see documented in medical record.  4. Other depression with emotional eating Behavior modification techniques were discussed today to help Ronnita deal with her emotional/non-hunger eating behaviors. We will refer to Dr. Dewaine Conger,  our Bariatric Psychologist for evaluation. Orders and follow up as documented in patient record.   5. At risk for diabetes mellitus Shyan was given approximately 30 minutes of diabetes education and counseling today. We discussed intensive lifestyle modifications today with  an emphasis on weight loss as well as increasing exercise and decreasing simple carbohydrates in her diet. We also reviewed medication options with an emphasis on risk versus benefit of those discussed.   Repetitive spaced learning was employed today to elicit superior memory formation and behavioral change.  6. Class 3 severe obesity with serious comorbidity and body mass index (BMI) of 45.0 to 49.9 in adult, unspecified obesity type (HCC) Cassandra Vazquez is currently in the action stage of change. As such, her goal is to continue with weight loss efforts. She has agreed to the [employer].   We discussed various medication options to help Cassandra Vazquez with her weight loss efforts and we both agreed to start topiramate 25 mg PO qhs with no refills.  - topiramate (TOPAMAX) 25 MG tablet; Take 1 tablet (25 mg total) by mouth at bedtime.  Dispense: 30 tablet; Refill: 0  Exercise goals: No exercise has been prescribed at this time.  Behavioral modification strategies: increasing lean protein intake, meal planning and cooking strategies, keeping healthy foods in the home and planning for success.  Cassandra Vazquez has agreed to follow-up with our clinic in 2 weeks. She was informed of the importance of frequent follow-up visits to maximize her success with intensive lifestyle modifications for her multiple health conditions.    Objective:   Blood pressure 119/79, pulse 86, temperature 98 F (36.7 C), temperature source Oral, height 5\' 4"  (1.626 m), weight 277 lb (125.6 kg), last menstrual period 03/28/2020, SpO2 99 %, unknown if currently breastfeeding. Body mass index is 47.55 kg/m.  General: Cooperative, alert, well developed, in no acute distress. HEENT: Conjunctivae and lids unremarkable. Cardiovascular: Regular rhythm.  Lungs: Normal work of breathing. Neurologic: No focal deficits.   Lab Results  Component Value Date   CREATININE 0.65 04/09/2020   BUN 10 04/09/2020   NA 136 04/09/2020   K 4.3  04/09/2020   CL 103 04/09/2020   CO2 19 (L) 04/09/2020   Lab Results  Component Value Date   ALT 17 04/09/2020   AST 20 04/09/2020   ALKPHOS 62 04/09/2020   BILITOT 0.3 04/09/2020   Lab Results  Component Value Date   HGBA1C 5.6 04/09/2020   HGBA1C 5.4 11/02/2019   HGBA1C 5.4 02/21/2018   Lab Results  Component Value Date   INSULIN 10.3 04/09/2020   Lab Results  Component Value Date   TSH 2.210 04/09/2020   Lab Results  Component Value Date   CHOL 210 (H) 04/09/2020   HDL 48 04/09/2020   LDLCALC 139 (H) 04/09/2020   TRIG 130 04/09/2020   CHOLHDL 4 11/02/2019   Lab Results  Component Value Date   WBC 10.9 (H) 04/09/2020   HGB 12.3 04/09/2020   HCT 38.4 04/09/2020   MCV 86 04/09/2020   PLT 427 04/09/2020   Lab Results  Component Value Date   IRON 76 11/13/2019   FERRITIN 47.4 11/13/2019      Attestation Statements:   Reviewed by clinician on day of visit: allergies, medications, problem list, medical history, surgical history, family history, social history, and previous encounter notes.   I, 11/15/2019, am acting as transcriptionist for Burt Knack, MD.  I have reviewed the above documentation for accuracy and completeness, and I agree with  the above. - Katherina Mires, MD

## 2020-05-08 ENCOUNTER — Ambulatory Visit (INDEPENDENT_AMBULATORY_CARE_PROVIDER_SITE_OTHER): Payer: BC Managed Care – PPO | Admitting: Family Medicine

## 2020-05-15 ENCOUNTER — Ambulatory Visit (INDEPENDENT_AMBULATORY_CARE_PROVIDER_SITE_OTHER): Payer: BC Managed Care – PPO | Admitting: Family Medicine

## 2020-05-15 ENCOUNTER — Encounter (INDEPENDENT_AMBULATORY_CARE_PROVIDER_SITE_OTHER): Payer: Self-pay | Admitting: Family Medicine

## 2020-05-15 ENCOUNTER — Other Ambulatory Visit: Payer: Self-pay

## 2020-05-15 VITALS — BP 115/76 | HR 85 | Temp 98.1°F | Ht 64.0 in | Wt 278.0 lb

## 2020-05-15 DIAGNOSIS — E559 Vitamin D deficiency, unspecified: Secondary | ICD-10-CM | POA: Diagnosis not present

## 2020-05-15 DIAGNOSIS — Z9189 Other specified personal risk factors, not elsewhere classified: Secondary | ICD-10-CM

## 2020-05-15 DIAGNOSIS — Z6841 Body Mass Index (BMI) 40.0 and over, adult: Secondary | ICD-10-CM

## 2020-05-15 DIAGNOSIS — F3289 Other specified depressive episodes: Secondary | ICD-10-CM | POA: Diagnosis not present

## 2020-05-19 ENCOUNTER — Inpatient Hospital Stay: Payer: Medicaid Other

## 2020-05-19 NOTE — Progress Notes (Unsigned)
Chief Complaint:   OBESITY Cassandra Vazquez is here to discuss her progress with her obesity treatment plan along with follow-up of her obesity related diagnoses. Cassandra Vazquez is on the [employer] and states she is following her eating plan approximately 70% of the time. Cassandra Vazquez states she is walking 10,000 steps 2-4 times per week.  Today's visit was #: 3 Starting weight: 273 lbs Starting date: 04/09/2020 Today's weight: 278 lbs Today's date: 05/15/2020 Total lbs lost to date: 0 Total lbs lost since last in-office visit: 0  Interim History: Following the plan has been up and down. Cassandra Vazquez feels less hungry after stopping her birth control. She likes the meal plan and doesn't want to change options for food. Pt hasn't taken Topamax much at all.  Subjective:   1. Vitamin D deficiency Pt denies nausea, vomiting, and muscle weakness but notes fatigue. Pt is on prescription Vit D.  2. Other depression, with emotional eating Pt denies suicidal or homicidal ideations. She is not on meds. She finds compliance to meal plan to be difficult.  3. At risk for medication nonadherence Cassandra Vazquez is at a higher than average risk for medication non-adherence due to not taking Topamax.  Assessment/Plan:   1. Vitamin D deficiency Low Vitamin D level contributes to fatigue and are associated with obesity, breast, and colon cancer. She agrees to continue to take prescription Vitamin D @50 ,000 IU every week and will follow-up for routine testing of Vitamin D, at least 2-3 times per year to avoid over-replacement.  2. Other depression, with emotional eating Behavior modification techniques were discussed today to help Cassandra Vazquez deal with her emotional/non-hunger eating behaviors.  Orders and follow up as documented in patient record. Refer to Dr. [name].   3. At risk for medication nonadherence Cassandra Vazquez was given approximately 15 minutes of counseling today to help her avoid medication non-adherence.  We discussed  importance of taking medications at a similar time each day and the use of daily pill organizers to help improve medication adherence.  Repetitive spaced learning was employed today to elicit superior memory formation and behavioral change.  4. Class 3 severe obesity with serious comorbidity and body mass index (BMI) of 45.0 to 49.9 in adult, unspecified obesity type (HCC) Cassandra Vazquez is currently in the action stage of change. As such, her goal is to continue with weight loss efforts. She has agreed to the [name].   Exercise goals: As is  Behavioral modification strategies: increasing lean protein intake, meal planning and cooking strategies, keeping healthy foods in the home and planning for success.  Cassandra Vazquez has agreed to follow-up with our clinic in 2 weeks. She was informed of the importance of frequent follow-up visits to maximize her success with intensive lifestyle modifications for her multiple health conditions.   Objective:   Blood pressure 115/76, pulse 85, temperature 98.1 F (36.7 C), temperature source Oral, height 5\' 4"  (1.626 m), weight 278 lb (126.1 kg), last menstrual period 05/01/2020, SpO2 97 %, unknown if currently breastfeeding. Body mass index is 47.72 kg/m.  General: Cooperative, alert, well developed, in no acute distress. HEENT: Conjunctivae and lids unremarkable. Cardiovascular: Regular rhythm.  Lungs: Normal work of breathing. Neurologic: No focal deficits.   Lab Results  Component Value Date   CREATININE 0.65 04/09/2020   BUN 10 04/09/2020   NA 136 04/09/2020   K 4.3 04/09/2020   CL 103 04/09/2020   CO2 19 (L) 04/09/2020   Lab Results  Component Value Date   ALT 17  04/09/2020   AST 20 04/09/2020   ALKPHOS 62 04/09/2020   BILITOT 0.3 04/09/2020   Lab Results  Component Value Date   HGBA1C 5.6 04/09/2020   HGBA1C 5.4 11/02/2019   HGBA1C 5.4 02/21/2018   Lab Results  Component Value Date   INSULIN 10.3 04/09/2020   Lab Results   Component Value Date   TSH 2.210 04/09/2020   Lab Results  Component Value Date   CHOL 210 (H) 04/09/2020   HDL 48 04/09/2020   LDLCALC 139 (H) 04/09/2020   TRIG 130 04/09/2020   CHOLHDL 4 11/02/2019   Lab Results  Component Value Date   WBC 10.9 (H) 04/09/2020   HGB 12.3 04/09/2020   HCT 38.4 04/09/2020   MCV 86 04/09/2020   PLT 427 04/09/2020   Lab Results  Component Value Date   IRON 76 11/13/2019   FERRITIN 47.4 11/13/2019    Attestation Statements:   Reviewed by clinician on day of visit: allergies, medications, problem list, medical history, surgical history, family history, social history, and previous encounter notes.  Edmund Hilda, am acting as transcriptionist for Reuben Likes, MD.   I have reviewed the above documentation for accuracy and completeness, and I agree with the above. - Katherina Mires, MD

## 2020-05-20 ENCOUNTER — Inpatient Hospital Stay: Payer: Medicaid Other | Attending: Oncology

## 2020-05-20 DIAGNOSIS — D649 Anemia, unspecified: Secondary | ICD-10-CM

## 2020-05-20 DIAGNOSIS — D729 Disorder of white blood cells, unspecified: Secondary | ICD-10-CM
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
## 2020-05-23 ENCOUNTER — Telehealth: Payer: Self-pay | Admitting: *Deleted

## 2020-05-23 ENCOUNTER — Inpatient Hospital Stay: Payer: Medicaid Other | Admitting: Oncology

## 2020-05-23 ENCOUNTER — Other Ambulatory Visit: Payer: Self-pay

## 2020-05-23 ENCOUNTER — Telehealth: Payer: Self-pay | Admitting: Oncology

## 2020-05-23 NOTE — Telephone Encounter (Signed)
Called pt and got her voicemail and left her a message that pt did not get her labs drawn on 2/23- I did see that pt came for labs. Then today to have a video visit and if she did not have the lab done then so therefore we need to r/s the video appt and labs appt

## 2020-05-23 NOTE — Telephone Encounter (Signed)
Left VM with pt to reschedule missed appts.

## 2020-05-26 ENCOUNTER — Telehealth: Payer: Self-pay | Admitting: Oncology

## 2020-05-26 NOTE — Telephone Encounter (Signed)
Left VM with pt requesting a call back to r/s missed lab and virtual visit with Dr. Smith Robert.

## 2020-05-29 ENCOUNTER — Ambulatory Visit (INDEPENDENT_AMBULATORY_CARE_PROVIDER_SITE_OTHER): Payer: BC Managed Care – PPO | Admitting: Family Medicine

## 2020-05-30 ENCOUNTER — Encounter: Payer: Self-pay | Admitting: Oncology

## 2020-05-30 ENCOUNTER — Telehealth: Payer: Self-pay | Admitting: Oncology

## 2020-05-30 NOTE — Telephone Encounter (Signed)
Left VM with patient to r/s labs and virtual visit with Dr. Smith Robert.

## 2020-06-03 ENCOUNTER — Telehealth (INDEPENDENT_AMBULATORY_CARE_PROVIDER_SITE_OTHER): Payer: BC Managed Care – PPO | Admitting: Psychology

## 2020-06-03 DIAGNOSIS — F3289 Other specified depressive episodes: Secondary | ICD-10-CM | POA: Diagnosis not present

## 2020-06-03 NOTE — Progress Notes (Signed)
  Office: [phone]  /  Fax: [phone]    Date: June 17, 2020   Appointment Start Time: 4:00pm Duration: 33 minutes Provider: Lawerance Cruel, Psy.D. 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 female presenting for a follow-up appointment to address the previously established treatment goal of increasing coping skills. Today's appointment was a telepsychological visit due to COVID-19. Cassandra Vazquez provided verbal consent for today's telepsychological appointment and she is aware she is responsible for securing confidentiality on her end of the session. Prior to proceeding with today's appointment, Cassandra Vazquez's physical location at the time of this appointment was obtained as well a phone number she could be reached at in the event of technical difficulties. Cassandra Vazquez and this provider participated in today's telepsychological service.   This provider conducted a brief check-in. Cassandra Vazquez reported eating has "been a struggle" due to cravings. She wonders if she is gaining weight. This was further explored. She acknowledged she often eats out of convenience, adding she previously skipped meals. This was further processed and she was engaged in problem solving. She agreed to bring foods (e.g., cheese sticks, fruits, veggie patties) to work for easy access and to increase eating regularly. She was also encouraged to discuss other meal plan options with [facility]quez during their next visit; she agreed. Moreover, psychoeducation regarding the consequences of not eating regularly was provided. Cassandra Vazquez was receptive to today's appointment as evidenced by openness to sharing, responsiveness to feedback, and willingness to implement discussed strategies . She also expressed willingness to focus on protein and vegetable intake, specifically during  dinner, between now and her appointment with [facility].   Mental Status Examination:  Appearance: well groomed and appropriate hygiene  Behavior: appropriate to circumstances Mood: euthymic Affect: mood congruent Speech: normal in rate, volume, and tone Eye Contact: appropriate Psychomotor Activity: unable to assess  Gait: unable to assess Thought Process: linear, logical, and goal directed  Thought Content/Perception: no hallucinations, delusions, bizarre thinking or behavior reported or observed and no evidence of suicidal and homicidal ideation, plan, and intent Orientation: time, person, place, and purpose of appointment Memory/Concentration: memory, attention, language, and fund of knowledge intact  Insight/Judgment: fair  Interventions:  Conducted a brief chart review Provided empathic reflections and validation Employed supportive psychotherapy interventions to facilitate reduced distress and to improve coping skills with identified stressors Engaged patient in problem solving  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 Cassandra Vazquez has demonstrated some progress in her goal as evidenced by engagement in problem solving to increase likelihood of eating congruent to her prescribed meal plan.   Plan: The next appointment will be scheduled in approximately two weeks, which will be via MyChart Video Visit. The next session will focus on working towards the established treatment goal.

## 2020-06-04 ENCOUNTER — Encounter: Payer: Self-pay | Admitting: Oncology

## 2020-06-16 ENCOUNTER — Ambulatory Visit (INDEPENDENT_AMBULATORY_CARE_PROVIDER_SITE_OTHER): Payer: BC Managed Care – PPO | Admitting: Family Medicine

## 2020-06-17 ENCOUNTER — Telehealth (INDEPENDENT_AMBULATORY_CARE_PROVIDER_SITE_OTHER): Payer: BC Managed Care – PPO | Admitting: Psychology

## 2020-06-17 DIAGNOSIS — F3289 Other specified depressive episodes: Secondary | ICD-10-CM

## 2020-06-18 NOTE — Progress Notes (Signed)
  Office: [phone]  /  Fax: [phone]    Date: July 02, 2020   Appointment Start Time: 4:00pm Duration: 24 minutes Provider: Lawerance Cruel, Psy.D. Type of Session: Individual Therapy  Vazquez of Patient: Work Government social research officer of Provider: Provider's Home (private office) Type of Contact: Telepsychological Visit via MyChart Video Visit  Session Content: Cassandra Vazquez is a 35 y.o. female presenting for a follow-up appointment to address the previously established treatment goal of increasing coping skills. Today's appointment was a telepsychological visit due to COVID-19. Cassandra Vazquez provided verbal consent for today's telepsychological appointment and she is aware she is responsible for securing confidentiality on her end of the session. Prior to proceeding with today's appointment, Cassandra Vazquez at in the event of technical difficulties. Cassandra Vazquez and this provider participated in today's telepsychological service.   This provider conducted a brief check-in. Cassandra Vazquez reported she spoke with Dr. Lawson Radar to address previously discussed challenges. She described eating more regularly, adding she is consuming more protein. Notably, [name] reported two upcoming trips. Thus, psychoeducation regarding making better choices and engaging in portion control during vacations/celebrations was provided. More specifically, this provider discussed the following strategies: coming to meals hungry, but not starving; taking snacks for road trips; avoid filling up on appetizers; managing portion sizes; not completely depriving yourself; making the plate colorful (e.g., vegetables); pacing yourself (e.g., waiting 10 minutes before going back for seconds); taking advantage of the nutritious foods; practicing mindfulness; staying hydrated; and avoid bringing home leftovers. Cassandra Vazquez was receptive to today's appointment as evidenced by  openness to sharing, responsiveness to feedback, and willingness to implement discussed strategies .  Mental Status Examination:  Appearance: well groomed and appropriate hygiene  Behavior: appropriate to circumstances Mood: euthymic Affect: mood congruent Speech: normal in rate, volume, and tone Eye Contact: appropriate Psychomotor Activity: unable to assess  Gait: unable to assess Thought Process: linear, logical, and goal directed  Thought Content/Perception: no hallucinations, delusions, bizarre thinking or behavior reported or observed and no evidence of suicidal and homicidal ideation, plan, and intent Orientation: time, person, place, and purpose of appointment Memory/Concentration: memory, attention, language, and fund of knowledge intact  Insight/Judgment: fair  Interventions:  Conducted a brief chart review Provided empathic reflections and validation Reviewed content from the previous session Employed supportive psychotherapy interventions to facilitate reduced distress and to improve coping skills with identified stressors Employed motivational interviewing skills to assess patient's willingness/desire to adhere to recommended medical treatments and assignments Psychoeducation provided regarding behavioral strategies for vacations/celebrations  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 Akemi has demonstrated progress in her goal as evidenced by an increase in protein intake and willingness to implement discussed strategies.   Plan: The next appointment will be scheduled in approximately two weeks, which will be via MyChart Video Visit. The next session will focus on working towards the established treatment goal.

## 2020-06-23 ENCOUNTER — Ambulatory Visit (INDEPENDENT_AMBULATORY_CARE_PROVIDER_SITE_OTHER): Payer: BC Managed Care – PPO | Admitting: Family Medicine

## 2020-06-23 ENCOUNTER — Encounter (INDEPENDENT_AMBULATORY_CARE_PROVIDER_SITE_OTHER): Payer: Self-pay | Admitting: Family Medicine

## 2020-06-23 ENCOUNTER — Other Ambulatory Visit: Payer: Self-pay

## 2020-06-23 VITALS — BP 130/81 | HR 76 | Temp 98.3°F | Ht 64.0 in | Wt 279.0 lb

## 2020-06-23 DIAGNOSIS — E559 Vitamin D deficiency, unspecified: Secondary | ICD-10-CM

## 2020-06-23 DIAGNOSIS — Z6841 Body Mass Index (BMI) 40.0 and over, adult: Secondary | ICD-10-CM | POA: Diagnosis not present

## 2020-06-23 DIAGNOSIS — E782 Mixed hyperlipidemia: Secondary | ICD-10-CM | POA: Diagnosis not present

## 2020-06-23 MED ORDER — TOPIRAMATE 25 MG PO TABS
25.0000 mg | ORAL_TABLET | Freq: Every day | ORAL | 0 refills | Status: DC
Start: 2020-06-23 — End: 2020-07-08

## 2020-06-23 MED ORDER — VITAMIN D (ERGOCALCIFEROL) 1.25 MG (50000 UNIT) PO CAPS: 50000.0000 [IU] | ORAL_CAPSULE | ORAL | 0 refills | Status: DC

## 2020-06-23 MED ORDER — PHENTERMINE HCL 8 MG PO TABS: 4.0000 mg | ORAL_TABLET | Freq: Every day | ORAL | 0 refills | Status: DC

## 2020-06-30 NOTE — Progress Notes (Signed)
Chief Complaint:   OBESITY Cassandra Vazquez is here to discuss her progress with her obesity treatment plan along with follow-up of her obesity related diagnoses. Cassandra Vazquez is on the [employer] and states she is following her eating plan approximately 60-70% of the time. Cassandra Vazquez states she is doing 0 minutes 0 times per week.  Today's visit was #: 4 Starting weight: 273 lbs Starting date: 04/09/2020 Today's weight: 279 lbs Today's date: 06/23/2020 Total lbs lost to date: 0 Total lbs lost since last in-office visit: 0  Interim History: Cassandra Vazquez has been celebrating her birthday and school for the last few weeks. She went to [facility] for her birthday. She has not been doing all food for meal options but dinner has been a struggle. Pt thinks perhaps keeping food at work would be easiest. Cassandra Vazquez has been a significant amount of pasta. She is traveling to [place] during spring break. EKG reviewed today. She is currently on oral contraception.  Subjective:   1. Vitamin D deficiency Cassandra Vazquez denies nausea, vomiting, and muscle weakness but notes fatigue. Pt is on prescription Vit D.  2. Other hyperlipidemia Cassandra Vazquez's LDL was 139, HDL 48, and triglycerides 093 on 04/09/2020. She is not on statin therapy.  Assessment/Plan:   1. Vitamin D deficiency Low Vitamin D level contributes to fatigue and are associated with obesity, breast, and colon cancer. She agrees to continue to take prescription Vitamin D @50 ,000 IU every week and will follow-up for routine testing of Vitamin D, at least 2-3 times per year to avoid over-replacement.  - Vitamin D, Ergocalciferol, (DRISDOL) 1.25 MG (50000 UNIT) CAPS capsule; Take 1 capsule (50,000 Units total) by mouth every 7 (seven) days.  Dispense: 4 capsule; Refill: 0  2. Other hyperlipidemia Cardiovascular risk and specific lipid/LDL goals reviewed.  We discussed several lifestyle modifications today and Cassandra Vazquez will continue to work on diet, exercise and weight loss  efforts. Orders and follow up as documented in patient record. Repeat labs in May.  Counseling Intensive lifestyle modifications are the first line treatment for this issue. . Dietary changes: Increase soluble fiber. Decrease simple carbohydrates. . Exercise changes: Moderate to vigorous-intensity aerobic activity 150 minutes per week if tolerated. . Lipid-lowering medications: see documented in medical record.  3. Class 3 severe obesity with serious comorbidity and body mass index (BMI) of 45.0 to 49.9 in adult, unspecified obesity type (HCC) Cassandra Vazquez is currently in the action stage of change. As such, her goal is to continue with weight loss efforts. She has agreed to keeping a food journal and adhering to recommended goals of 400-500 calories and 35+ g protein and the Pescatarian Plan.   We discussed various medication options to help Cassandra Vazquez with her weight loss efforts and we both agreed to continuing Topamax and starting phentermine 4 mg, as per below.  - Phentermine HCl 8 MG TABS; Take 4 mg by mouth daily.  Dispense: 30 tablet; Refill: 0 - topiramate (TOPAMAX) 25 MG tablet; Take 1 tablet (25 mg total) by mouth at bedtime.  Dispense: 30 tablet; Refill: 0  Exercise goals: As is  Behavioral modification strategies: increasing lean protein intake, meal planning and cooking strategies, keeping healthy foods in the home and planning for success.  Cassandra Vazquez has agreed to follow-up with our clinic in 2 weeks. She was informed of the importance of frequent follow-up visits to maximize her success with intensive lifestyle modifications for her multiple health conditions.   Objective:   Blood pressure 130/81, pulse 76, temperature 98.3 F (  36.8 C), temperature source Oral, height 5\' 4"  (1.626 m), weight 279 lb (126.6 kg), last menstrual period 06/17/2020, SpO2 98 %, unknown if currently breastfeeding. Body mass index is 47.89 kg/m.  General: Cooperative, alert, well developed, in no acute  distress. HEENT: Conjunctivae and lids unremarkable. Cardiovascular: Regular rhythm.  Lungs: Normal work of breathing. Neurologic: No focal deficits.   Lab Results  Component Value Date   CREATININE 0.65 04/09/2020   BUN 10 04/09/2020   NA 136 04/09/2020   K 4.3 04/09/2020   CL 103 04/09/2020   CO2 19 (L) 04/09/2020   Lab Results  Component Value Date   ALT 17 04/09/2020   AST 20 04/09/2020   ALKPHOS 62 04/09/2020   BILITOT 0.3 04/09/2020   Lab Results  Component Value Date   HGBA1C 5.6 04/09/2020   HGBA1C 5.4 11/02/2019   HGBA1C 5.4 02/21/2018   Lab Results  Component Value Date   INSULIN 10.3 04/09/2020   Lab Results  Component Value Date   TSH 2.210 04/09/2020   Lab Results  Component Value Date   CHOL 210 (H) 04/09/2020   HDL 48 04/09/2020   LDLCALC 139 (H) 04/09/2020   TRIG 130 04/09/2020   CHOLHDL 4 11/02/2019   Lab Results  Component Value Date   WBC 10.9 (H) 04/09/2020   HGB 12.3 04/09/2020   HCT 38.4 04/09/2020   MCV 86 04/09/2020   PLT 427 04/09/2020   Lab Results  Component Value Date   IRON 76 11/13/2019   FERRITIN 47.4 11/13/2019     Attestation Statements:   Reviewed by clinician on day of visit: allergies, medications, problem list, medical history, surgical history, family history, social history, and previous encounter notes.  11/15/2019, am acting as transcriptionist for Edmund Hilda, MD.   I have reviewed the above documentation for accuracy and completeness, and I agree with the above. - Reuben Likes, MD

## 2020-07-02 ENCOUNTER — Telehealth (INDEPENDENT_AMBULATORY_CARE_PROVIDER_SITE_OTHER): Payer: BC Managed Care – PPO | Admitting: Psychology

## 2020-07-02 DIAGNOSIS — F3289 Other specified depressive episodes: Secondary | ICD-10-CM | POA: Diagnosis not present

## 2020-07-08 ENCOUNTER — Telehealth (INDEPENDENT_AMBULATORY_CARE_PROVIDER_SITE_OTHER): Payer: BC Managed Care – PPO | Admitting: Family

## 2020-07-08 ENCOUNTER — Encounter: Payer: Self-pay | Admitting: Family

## 2020-07-08 ENCOUNTER — Telehealth: Payer: Self-pay | Admitting: Family

## 2020-07-08 ENCOUNTER — Other Ambulatory Visit: Payer: Self-pay

## 2020-07-08 VITALS — Ht 64.0 in | Wt 284.0 lb

## 2020-07-08 DIAGNOSIS — J329 Chronic sinusitis, unspecified: Secondary | ICD-10-CM | POA: Diagnosis not present

## 2020-07-08 MED ORDER — AMOXICILLIN-POT CLAVULANATE 875-125 MG PO TABS: 1.0000 | ORAL_TABLET | Freq: Two times a day (BID) | ORAL | 0 refills | Status: AC

## 2020-07-08 NOTE — Telephone Encounter (Signed)
Left message to return call. Link sent to start 10:00 am video visit.

## 2020-07-08 NOTE — Progress Notes (Signed)
Virtual Visit via Video Note  I connected with@  on 07/08/20 at 10:00 AM EDT by a video enabled telemedicine application and verified that I am speaking with the correct person using two identifiers.  Location patient: home Location provider:work  Persons participating in the virtual visit: patient, provider  I discussed the limitations of evaluation and management by telemedicine and the availability of in person appointments. The patient expressed understanding and agreed to proceed.   HPI: Congestion 3 weeks,worsening.  Congestions is green in color. Endorses sore throat, HA, intermittent cough, ear fullness bilaterally . No fever, SOB, sneezing, facial pain, facial swelling. Negative covid test 2 weeks ago.  No h/o seasonal allergies.  Started claritin a couple of days ago without relief. No recent antibiotic.    ROS: See pertinent positives and negatives per HPI.    EXAM:  VITALS per patient if applicable: Ht 5\' 4"  (1.626 m)   Wt 284 lb (128.8 kg)   LMP 07/02/2020 (Exact Date)   Breastfeeding No   BMI 48.75 kg/m  BP Readings from Last 3 Encounters:  06/23/20 130/81  05/15/20 115/76  04/23/20 119/79   Wt Readings from Last 3 Encounters:  07/08/20 284 lb (128.8 kg)  06/23/20 279 lb (126.6 kg)  05/15/20 278 lb (126.1 kg)    GENERAL: alert, oriented, appears well and in no acute distress  HEENT: atraumatic, conjunttiva clear, no obvious abnormalities on inspection of external nose and ears  NECK: normal movements of the head and neck  LUNGS: on inspection no signs of respiratory distress, breathing rate appears normal, no obvious gross SOB, gasping or wheezing  CV: no obvious cyanosis  MS: moves all visible extremities without noticeable abnormality  PSYCH/NEURO: pleasant and cooperative, no obvious depression or anxiety, speech and thought processing grossly intact  ASSESSMENT AND PLAN:  Discussed the following assessment and plan:  Problem List Items  Addressed This Visit      Respiratory   Sinusitis - Primary    Duration 3 weeks, worsening. Afebrile. covid negative one week into symptoms. Start augmentin, antibiotics. Retest for covid if any new or worsening symptoms.       Relevant Medications   amoxicillin-clavulanate (AUGMENTIN) 875-125 MG tablet      -we discussed possible serious and likely etiologies, options for evaluation and workup, limitations of telemedicine visit vs in person visit, treatment, treatment risks and precautions. Pt prefers to treat via telemedicine empirically rather then risking or undertaking an in person visit at this moment.  .   I discussed the assessment and treatment plan with the patient. The patient was provided an opportunity to ask questions and all were answered. The patient agreed with the plan and demonstrated an understanding of the instructions.   The patient was advised to call back or seek an in-person evaluation if the symptoms worsen or if the condition fails to improve as anticipated.   05/17/20, FNP    CMA:   Onset of symptoms 2 and a half to 3 weeks. Patient's husband having symptoms but has allergies.   No history of allergies, no family history. Green mucous when blowing the nose, Patient having a cough, congestions, sore throat, and headaches.

## 2020-07-08 NOTE — Assessment & Plan Note (Signed)
Duration 3 weeks, worsening. Afebrile. covid negative one week into symptoms. Start augmentin, antibiotics. Retest for covid if any new or worsening symptoms.

## 2020-07-08 NOTE — Patient Instructions (Signed)
Based on duration of symptoms, I am concerned you may have bacterial sinus infection.  Start antibiotic, augmentin.  You may HOLD on claritin for now as I think mucinex ( plain) over the counter will be more helpful to loosen congestion. Take this medication for 3-5 days  and ensure you are drinking plenty of water. Ensure to take probiotics while on antibiotics and also for 2 weeks after completion. This can either be by eating yogurt daily or taking a probiotic supplement over the counter such as Culturelle.It is important to re-colonize the gut with good bacteria and also to prevent any diarrheal infections associated with antibiotic use.   If symptoms were to change or worsen, please also take another home covid test to ensure that this is not covid.   Let me know if you are not feeling better !

## 2020-07-08 NOTE — Telephone Encounter (Signed)
Patient answered link for appointment and will be seen

## 2020-07-14 ENCOUNTER — Telehealth (INDEPENDENT_AMBULATORY_CARE_PROVIDER_SITE_OTHER): Payer: BC Managed Care – PPO | Admitting: Family Medicine

## 2020-07-14 ENCOUNTER — Other Ambulatory Visit: Payer: Self-pay

## 2020-07-14 DIAGNOSIS — E559 Vitamin D deficiency, unspecified: Secondary | ICD-10-CM

## 2020-07-14 DIAGNOSIS — Z6841 Body Mass Index (BMI) 40.0 and over, adult: Secondary | ICD-10-CM | POA: Diagnosis not present

## 2020-07-14 DIAGNOSIS — E8881 Metabolic syndrome: Secondary | ICD-10-CM | POA: Diagnosis not present

## 2020-07-15 NOTE — Progress Notes (Signed)
TeleHealth Visit:  Due to the COVID-19 pandemic, this visit was completed with telemedicine (audio/video) technology to reduce patient and provider exposure as well as to preserve personal protective equipment.   Cassandra Vazquez has verbally consented to this TeleHealth visit. The patient is located at home, the provider is located at the [facility] office. The participants in this visit include the listed provider and patient. The visit was conducted today via video.   Chief Complaint: OBESITY Cassandra Vazquez is here to discuss her progress with her obesity treatment plan along with follow-up of her obesity related diagnoses. Cassandra Vazquez is on the Category 2 Plan and keeping a food journal and adhering to recommended goals of 450-500 calories and 35+ g protein and states she is following her eating plan approximately 100% of the time. Cassandra Vazquez states she is not currently exercising.  Today's visit was #: 5 Starting weight: 273 lbs Starting date: 04/09/2020  Interim History: Cassandra Vazquez is experiencing cough and congestion for a few weeks. She hasn't been seen yet due to concerns for COVID. If she eats, she eats on plan but is often skipping meats (breakfast and lunch). She is doing juice in the morning (green juice). Cassandra Vazquez does reports she wants to make an effort to get more  food in throughout the day.  Subjective:   1. Vitamin D deficiency Cassandra Vazquez takes a Vit D supplement when she remembers. She reports fatigue.   2. Insulin resistance Cassandra Vazquez's last insulin level was 10.3 and A1c 5.6. She is not on medication.  Assessment/Plan:   1. Vitamin D deficiency Low Vitamin D level contributes to fatigue and are associated with obesity, breast, and colon cancer. She agrees to continue to take prescription Vitamin D @50 ,000 IU every week and will follow-up for routine testing of Vitamin D, at least 2-3 times per year to avoid over-replacement. No refill needed.  2. Insulin resistance Hue will continue  to work on weight loss, exercise, and decreasing simple carbohydrates to help decrease the risk of diabetes. Cassandra Vazquez agreed to follow-up with [name] as directed to closely monitor her progress. Repeat labs in June.  3. Class 3 severe obesity with serious comorbidity and body mass index (BMI) of 45.0 to 49.9 in adult, unspecified obesity type (HCC) Cassandra Vazquez is currently in the action stage of change. As such, her goal is to continue with weight loss efforts. She has agreed to the Category 2 Plan and keeping a food journal and adhering to recommended goals of 450-500 calories and 35+ g protein with supper.   Exercise goals: As is  Behavioral modification strategies: increasing lean protein intake, meal planning and cooking strategies, keeping healthy foods in the home and planning for success.  Cassandra Vazquez has agreed to follow-up with our clinic in 3 weeks. She was informed of the importance of frequent follow-up visits to maximize her success with intensive lifestyle modifications for her multiple health conditions.  Objective:   VITALS: Per patient if applicable, see vitals. GENERAL: Alert and in no acute distress. CARDIOPULMONARY: No increased WOB. Speaking in clear sentences.  PSYCH: Pleasant and cooperative. Speech normal rate and rhythm. Affect is appropriate. Insight and judgement are appropriate. Attention is focused, linear, and appropriate.  NEURO: Oriented as arrived to appointment on time with no prompting.   Lab Results  Component Value Date   CREATININE 0.65 04/09/2020   BUN 10 04/09/2020   NA 136 04/09/2020   K 4.3 04/09/2020   CL 103 04/09/2020   CO2 19 (L) 04/09/2020  Lab Results  Component Value Date   ALT 17 04/09/2020   AST 20 04/09/2020   ALKPHOS 62 04/09/2020   BILITOT 0.3 04/09/2020   Lab Results  Component Value Date   HGBA1C 5.6 04/09/2020   HGBA1C 5.4 11/02/2019   HGBA1C 5.4 02/21/2018   Lab Results  Component Value Date   INSULIN 10.3 04/09/2020   Lab  Results  Component Value Date   TSH 2.210 04/09/2020   Lab Results  Component Value Date   CHOL 210 (H) 04/09/2020   HDL 48 04/09/2020   LDLCALC 139 (H) 04/09/2020   TRIG 130 04/09/2020   CHOLHDL 4 11/02/2019   Lab Results  Component Value Date   WBC 10.9 (H) 04/09/2020   HGB 12.3 04/09/2020   HCT 38.4 04/09/2020   MCV 86 04/09/2020   PLT 427 04/09/2020   Lab Results  Component Value Date   IRON 76 11/13/2019   FERRITIN 47.4 11/13/2019    Attestation Statements:   Reviewed by clinician on day of visit: allergies, medications, problem list, medical history, surgical history, family history, social history, and previous encounter notes.  Edmund Hilda, am acting as transcriptionist for Reuben Likes, MD.  I have reviewed the above documentation for accuracy and completeness, and I agree with the above. - Katherina Mires, MD

## 2020-07-17 ENCOUNTER — Telehealth (INDEPENDENT_AMBULATORY_CARE_PROVIDER_SITE_OTHER): Payer: BC Managed Care – PPO | Admitting: Psychology

## 2020-07-17 DIAGNOSIS — F3289 Other specified depressive episodes: Secondary | ICD-10-CM | POA: Diagnosis not present

## 2020-07-17 NOTE — Progress Notes (Signed)
Office: [phone]  /  Fax: [phone]    Date: July 17, 2020    Appointment Start Time: 3:35pm Duration: 33 minutes Provider: Lawerance Cruel, Psy.D. Type of Session: Individual Therapy  Location of Patient: Home Location of Provider: Provider's Home (private office) Type of Contact: Telepsychological Visit via MyChart Video Visit (audio only)  Session Content: Cassandra Vazquez is a 35 y.o. female presenting for a follow-up appointment to address the previously established treatment goal of increasing coping skills. Today's appointment was a telepsychological visit due to COVID-19. Cassandra Vazquez provided verbal consent for today's telepsychological appointment and she is aware she is responsible for securing confidentiality on her end of the session. Prior to proceeding with today's appointment, Cassandra Vazquez's physical location at the time of this appointment was obtained as well a phone number she could be reached at in the event of technical difficulties. Cassandra Vazquez and this provider participated in today's telepsychological service. Notably, Cassandra Vazquez was unable to connect with video capabilities.   This provider conducted a brief check-in. Cassandra Vazquez shared about her recent trip to [place], adding she is on spring break this week. She shared about her eating habits while traveling. Cassandra Vazquez described engaging in portion control and making better choices; however, she acknowledged she is likely still not eating enough and that she recently has been engaging in eating frequently while she watches Netflix. She indicated she is unsure about whether or not she engages in emotional eating. Thus, psychoeducation regarding emotional versus physical hunger was provided. Moreover, psychoeducation regarding triggers for emotional eating was provided. Cassandra Vazquez was provided a handout, and encouraged to utilize the handout between now and the next appointment to increase awareness of triggers and frequency. Cassandra Vazquez agreed. This provider also  discussed behavioral strategies for specific triggers, such as placing the utensil down when conversing to avoid mindless eating. Cassandra Vazquez provided verbal consent during today's appointment for this provider to send a handout about hunger and a handout about triggers via e-mail. Cassandra Vazquez was receptive to today's appointment as evidenced by openness to sharing, responsiveness to feedback, and willingness to explore triggers for emotional eating. Furthermore, she noted, "This session has been really really good."  Mental Status Examination:  Appearance: unable to assess  Behavior: appropriate to circumstances Mood: euthymic Affect: unable to fully assess Speech: normal in rate, volume, and tone Eye Contact: unable to assess Psychomotor Activity: unable to assess  Gait: unable to assess Thought Process: linear, logical, and goal directed  Thought Content/Perception: no hallucinations, delusions, bizarre thinking or behavior reported or observed and no evidence or endorsement of suicidal and homicidal ideation, plan, and intent Orientation: time, person, place, and purpose of appointment Memory/Concentration: memory, attention, language, and fund of knowledge intact  Insight/Judgment: fair  Interventions:  Conducted a brief chart review Provided empathic reflections and validation Employed supportive psychotherapy interventions to facilitate reduced distress and to improve coping skills with identified stressors Psychoeducation provided regarding physical versus emotional hunger Psychoeducation provided regarding triggers for emotional eating  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 Cassandra Vazquez has demonstrated progress in her goal as evidenced by an increase in protein intake and willingness to  implement discussed strategies. She also demonstrated willingness to explore triggers for emotional eating.   Plan: Based on appointment availability and Ezra requiring the latest available appointment due to work, the next appointment will be scheduled in 3-4 weeks, which will be  via [facility] Visit. The next session will focus on working towards the established treatment goal.

## 2020-07-28 NOTE — Progress Notes (Signed)
  Office: [phone]  /  Fax: [phone]    Date: Aug 11, 2020   Appointment Start Time: 4:02pm Duration: 25 minutes Provider: Lawerance Cruel, Psy.D. 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 female presenting for a follow-up appointment to address the previously established treatment goal of increasing coping skills. Today's appointment was a telepsychological visit due to COVID-19. Cassandra Vazquez provided verbal consent for today's telepsychological appointment and she is aware she is responsible for securing confidentiality on her end of the session. Prior to proceeding with today's appointment, Cassandra Vazquez's physical location at the time of this appointment was obtained as well a phone number she could be reached at in the event of technical difficulties. Cassandra Vazquez and this provider participated in today's telepsychological service.   This provider conducted a brief check-in. Cassandra Vazquez discussed she eats out of boredom frequently and eats when she feels she "should be eating." She discussed continuing to have challenges with eating habits and implementing discussed strategies. Thus, session focused on exploring and processing associated thoughts and feelings. She reported feeling "stressed out all the time about what [she is] eating." She also noted a belief day to day stressors impact her eating habits. Thus, this provider recommended longer-term therapeutic services. Cassandra Vazquez provided verbal consent for this provider to place a referral with [facility] to address day to day stressors. Moreover, Cassandra Vazquez discussed feeling overwhelmed with eating congruent to prescribed calorie/protein goals daily/regularly. As such, psychoeducation regarding SMART goals was provided and Cassandra Vazquez was engaged in goal setting. The following goal  was established for dinner: Cassandra Vazquez will eat congruent to her calorie/protein goals at least 3 out of 7 days a week between now and the next appointment. Cassandra Vazquez was receptive to today's appointment as evidenced by openness to sharing, responsiveness to feedback, and willingness to work toward established SMART goal.  Mental Status Examination:  Appearance: well groomed and appropriate hygiene  Behavior: appropriate to circumstances Mood: euthymic Affect: mood congruent Speech: normal in rate, volume, and tone Eye Contact: appropriate Psychomotor Activity: unable to assess  Gait: unable to assess Thought Process: linear, logical, and goal directed  Thought Content/Perception: no hallucinations, delusions, bizarre thinking or behavior reported or observed and no evidence or endorsement of suicidal and homicidal ideation, plan, and intent Orientation: time, person, place, and purpose of appointment Memory/Concentration: memory, attention, language, and fund of knowledge intact  Insight/Judgment: fair  Interventions:  Conducted a brief chart review Provided empathic reflections and validation Employed supportive psychotherapy interventions to facilitate reduced distress and to improve coping skills with identified stressors Engaged patient in goal setting Recommended/discussed option for longer-term therapeutic services Psychoeducation provided regarding SMART goals  DSM-5 Diagnosis(es): F32.89 Other Specified Depressive Disorder, Emotional Eating Behaviors  Treatment Goal & Progress: During the initial appointment with this provider, the following treatment goal was established: increase coping skills. Cassandra Vazquez has demonstrated progress in her goal as evidenced by increased awareness of hunger patterns and increased awareness of triggers for emotional eating. Cassandra Vazquez also continues to demonstrate willingness to engage in learned skill(s).  Plan: Based on appointment availability and Cassandra Vazquez's  work schedule, the next appointment will be scheduled in one month, which will be via MyChart Video Visit. The next session will focus on working towards the established treatment goal. Additionally, this provider will place a referral with [facility].

## 2020-08-06 ENCOUNTER — Encounter (INDEPENDENT_AMBULATORY_CARE_PROVIDER_SITE_OTHER): Payer: Self-pay | Admitting: Bariatrics

## 2020-08-06 ENCOUNTER — Other Ambulatory Visit: Payer: Self-pay

## 2020-08-06 ENCOUNTER — Ambulatory Visit (INDEPENDENT_AMBULATORY_CARE_PROVIDER_SITE_OTHER): Payer: BC Managed Care – PPO | Admitting: Bariatrics

## 2020-08-06 VITALS — BP 132/84 | HR 75 | Temp 98.3°F | Ht 64.0 in | Wt 279.0 lb

## 2020-08-06 DIAGNOSIS — E8881 Metabolic syndrome: Secondary | ICD-10-CM | POA: Diagnosis not present

## 2020-08-06 DIAGNOSIS — Z6841 Body Mass Index (BMI) 40.0 and over, adult: Secondary | ICD-10-CM | POA: Diagnosis not present

## 2020-08-07 ENCOUNTER — Encounter (INDEPENDENT_AMBULATORY_CARE_PROVIDER_SITE_OTHER): Payer: Self-pay | Admitting: Bariatrics

## 2020-08-07 NOTE — Progress Notes (Signed)
Chief Complaint:   OBESITY Cassandra Vazquez is here to discuss her progress with her obesity treatment plan along with follow-up of her obesity related diagnoses. Cassandra Vazquez is on the [employer] and states she is following her eating plan approximately 50% of the time. Cassandra Vazquez states she is not currently exercising.  Today's visit was #: 6 Starting weight: 273 lbs Starting date: 04/09/2020 Today's weight: 279 lbs Today's date: 08/06/2020 Total lbs lost to date: 0 Total lbs lost since last in-office visit: 0  Interim History: Cassandra Vazquez's weight remains the same. She is very busy and eats with her children. She is having a hard time not eating pasta or rice. She usually doesn't eat breakfast.   Subjective:   1. Insulin resistance Cassandra Vazquez is not on medication.  Lab Results  Component Value Date   INSULIN 10.3 04/09/2020   Lab Results  Component Value Date   HGBA1C 5.6 04/09/2020    Assessment/Plan:   1. Insulin resistance Cassandra Vazquez will continue to work on weight loss, exercise, and decreasing simple carbohydrates to help decrease the risk of diabetes. Cassandra Vazquez agreed to follow-up with [place] as directed to closely monitor her progress. Increase healthy fats and protein.  2. Obesity, current BMI 47  Cassandra Vazquez is currently in the action stage of change. As such, her goal is to continue with weight loss efforts. She has agreed to the [employer].   Discussed alternatives for pasta and rice. Strategies for dinner Protein shakes  Exercise goals: As is  Behavioral modification strategies: increasing lean protein intake, decreasing simple carbohydrates, increasing vegetables, increasing water intake, decreasing eating out, no skipping meals, meal planning and cooking strategies, keeping healthy foods in the home and planning for success.  Cassandra Vazquez has agreed to follow-up with our clinic in 2 weeks with Dr. Lawson Radar. She was informed of the importance of frequent follow-up visits to maximize her  success with intensive lifestyle modifications for her multiple health conditions.   Objective:   Blood pressure 132/84, pulse 75, temperature 98.3 F (36.8 C), height 5\' 4"  (1.626 m), weight 279 lb (126.6 kg), SpO2 98 %, not currently breastfeeding. Body mass index is 47.89 kg/m.  General: Cooperative, alert, well developed, in no acute distress. HEENT: Conjunctivae and lids unremarkable. Cardiovascular: Regular rhythm.  Lungs: Normal work of breathing. Neurologic: No focal deficits.   Lab Results  Component Value Date   CREATININE 0.65 04/09/2020   BUN 10 04/09/2020   NA 136 04/09/2020   K 4.3 04/09/2020   CL 103 04/09/2020   CO2 19 (L) 04/09/2020   Lab Results  Component Value Date   ALT 17 04/09/2020   AST 20 04/09/2020   ALKPHOS 62 04/09/2020   BILITOT 0.3 04/09/2020   Lab Results  Component Value Date   HGBA1C 5.6 04/09/2020   HGBA1C 5.4 11/02/2019   HGBA1C 5.4 02/21/2018   Lab Results  Component Value Date   INSULIN 10.3 04/09/2020   Lab Results  Component Value Date   TSH 2.210 04/09/2020   Lab Results  Component Value Date   CHOL 210 (H) 04/09/2020   HDL 48 04/09/2020   LDLCALC 139 (H) 04/09/2020   TRIG 130 04/09/2020   CHOLHDL 4 11/02/2019   Lab Results  Component Value Date   WBC 10.9 (H) 04/09/2020   HGB 12.3 04/09/2020   HCT 38.4 04/09/2020   MCV 86 04/09/2020   PLT 427 04/09/2020   Lab Results  Component Value Date   IRON 76 11/13/2019   FERRITIN  47.4 11/13/2019    Attestation Statements:   Reviewed by clinician on day of visit: allergies, medications, problem list, medical history, surgical history, family history, social history, and previous encounter notes.  Time spent on visit including pre-visit chart review and post-visit care and charting was 20 minutes.   Edmund Hilda, CMA, am acting as Energy manager for [employer], DO.  I have reviewed the above documentation for accuracy and completeness, and I agree with  the above. Corinna Capra, DO

## 2020-08-11 ENCOUNTER — Telehealth (INDEPENDENT_AMBULATORY_CARE_PROVIDER_SITE_OTHER): Payer: BC Managed Care – PPO | Admitting: Psychology

## 2020-08-11 DIAGNOSIS — F3289 Other specified depressive episodes: Secondary | ICD-10-CM | POA: Diagnosis not present

## 2020-08-21 ENCOUNTER — Encounter (INDEPENDENT_AMBULATORY_CARE_PROVIDER_SITE_OTHER): Payer: Self-pay | Admitting: Family Medicine

## 2020-08-21 ENCOUNTER — Telehealth (INDEPENDENT_AMBULATORY_CARE_PROVIDER_SITE_OTHER): Payer: BC Managed Care – PPO | Admitting: Family Medicine

## 2020-08-21 ENCOUNTER — Other Ambulatory Visit: Payer: Self-pay

## 2020-08-21 ENCOUNTER — Encounter (INDEPENDENT_AMBULATORY_CARE_PROVIDER_SITE_OTHER): Payer: Self-pay

## 2020-08-26 NOTE — Progress Notes (Unsigned)
  Office: [phone]  /  Fax: [phone]    Date: September 08, 2020   Appointment Start Time:  Duration: minutes Provider: Lawerance Cruel, Psy.D. Type of Session: Individual Therapy  Location of Patient: {gbptloc:23249} Location of Provider: Provider's home (private office) Type of Contact: Telepsychological Visit via MyChart Video Visit  Session Content: This provider called [name] at 2:02pm as she did not present for the telepsychological appointment. A HIPAA compliant voicemail was left requesting a call back. As such, today's appointment was initiated *** minutes late. Cassandra Vazquez is a 35 y.o. female presenting for a follow-up appointment to address the previously established treatment goal of increasing coping skills. Today's appointment was a telepsychological visit due to COVID-19. [name] provided verbal consent for today's telepsychological appointment and she is aware she is responsible for securing confidentiality on her end of the session. Prior to proceeding with today's appointment, [name]'s physical location at the time of this appointment was obtained as well a phone number she could be reached at in the event of technical difficulties. Donette and this provider participated in today's telepsychological service.   This provider conducted a brief check-in. *** [name] was receptive to today's appointment as evidenced by openness to sharing, responsiveness to feedback, and {gbreceptiveness:23401}.  Mental Status Examination:  Appearance: {Appearance:22431} Behavior: {Behavior:22445} Mood: {gbmood:21757} Affect: {Affect:22436} Speech: {Speech:22432} Eye Contact: {Eye Contact:22433} Psychomotor Activity: {Motor Activity:22434} Gait: {gbgait:23404} Thought Process: {thought process:22448}  Thought Content/Perception: {disturbances:22451} Orientation: {Orientation:22437} Memory/Concentration: {gbcognition:22449} Insight/Judgment: {Insight:22446}  Interventions:  {Interventions for  Progress Notes:23405}  DSM-5 Diagnosis(es): F32.89 Other Specified Depressive Disorder, Emotional Eating Behaviors  Treatment Goal & Progress: During the initial appointment with this provider, the following treatment goal was established: increase coping skills. [name] has demonstrated progress in her goal as evidenced by {gbtxprogress:22839}. [name] also {gbtxprogress2:22951}.  Plan: The next appointment will be scheduled in {gbweeks:21758}, which will be {gbtxmodality:23402}. The next session will focus on {Plan for Next Appointment:23400}.

## 2020-09-08 ENCOUNTER — Encounter (INDEPENDENT_AMBULATORY_CARE_PROVIDER_SITE_OTHER): Payer: Self-pay

## 2020-09-08 ENCOUNTER — Telehealth (INDEPENDENT_AMBULATORY_CARE_PROVIDER_SITE_OTHER): Payer: BC Managed Care – PPO | Admitting: Psychology

## 2020-09-08 ENCOUNTER — Telehealth (INDEPENDENT_AMBULATORY_CARE_PROVIDER_SITE_OTHER): Payer: Self-pay | Admitting: Psychology

## 2020-09-08 NOTE — Telephone Encounter (Signed)
  Office: [phone]  /  Fax: [phone]  Date of Call: September 08, 2020  Time of Call: 2:02pm Provider: Lawerance Cruel, PsyD  CONTENT: This provider called [name] to check-in as she did not present for today's MyChart Video Visit appointment at 2:00pm. A HIPAA compliant voicemail was left requesting a call back. Of note, this provider stayed on the MyChart Video Visit appointment for 5 minutes prior to signing off per the clinic's grace period policy.    PLAN: This provider will wait for Marisal to call back. No further follow-up planned by this provider.

## 2020-09-16 ENCOUNTER — Ambulatory Visit (INDEPENDENT_AMBULATORY_CARE_PROVIDER_SITE_OTHER): Payer: BC Managed Care – PPO | Admitting: Family Medicine

## 2020-10-06 IMAGING — US US RENAL
1 series · 14 of 25 positions shown · non-contrast
Comparison: None.

CLINICAL DATA: Right flank pain.

EXAM:
RENAL / URINARY TRACT ULTRASOUND COMPLETE

[Series 1: us renal · 14 of 31 slices shown]
[im 1/31]
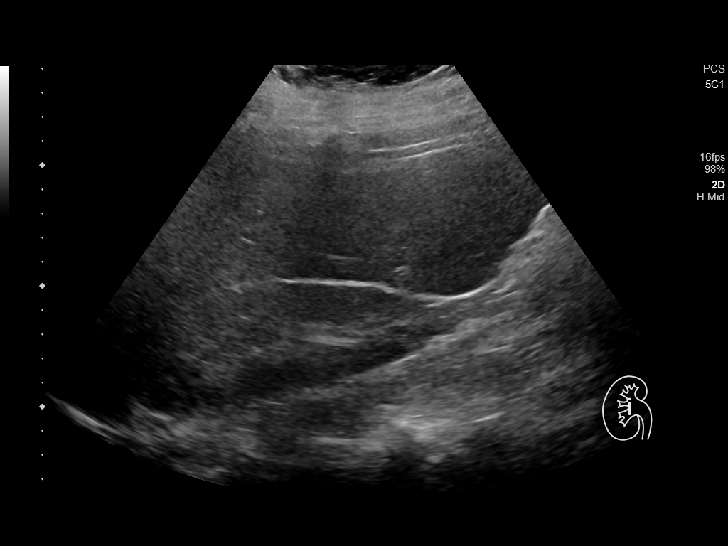
[im 3/31]
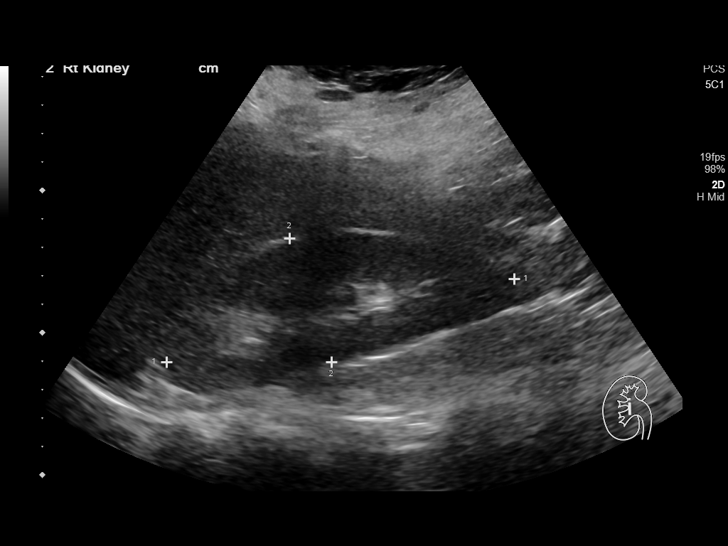
[im 6/31]
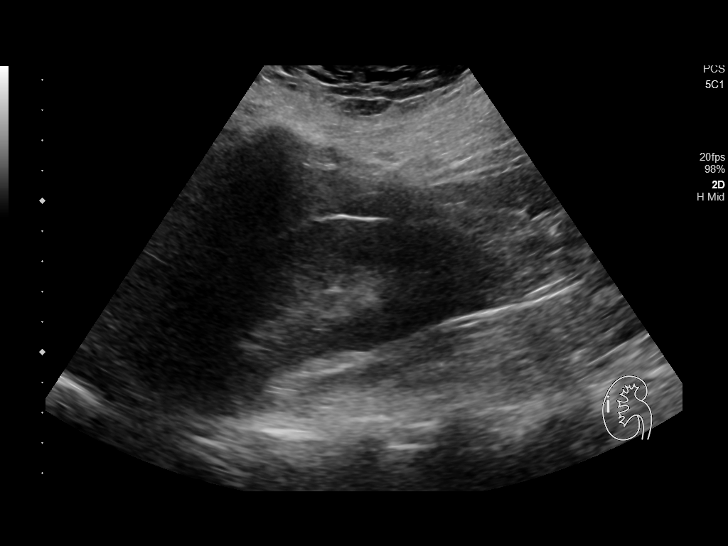
[im 8/31]
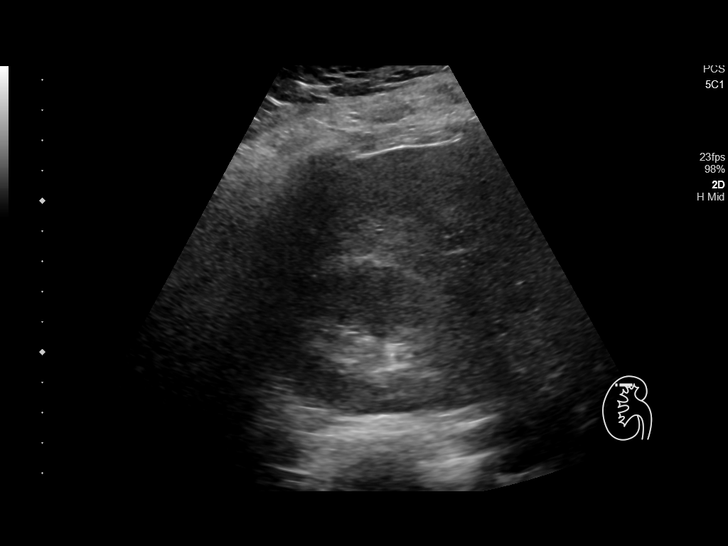
[im 11/31]
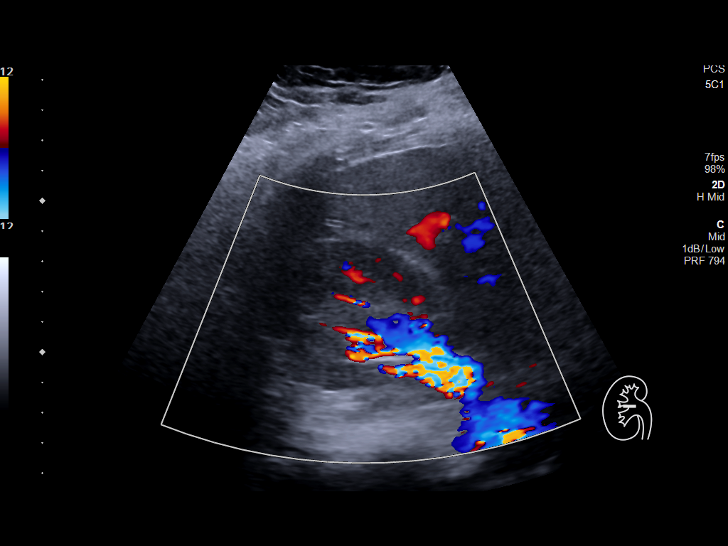
[im 12/31]
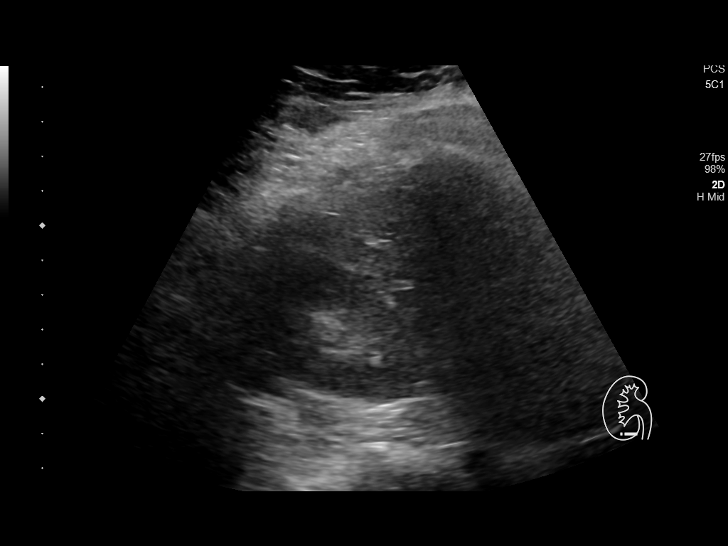
[im 14/31]
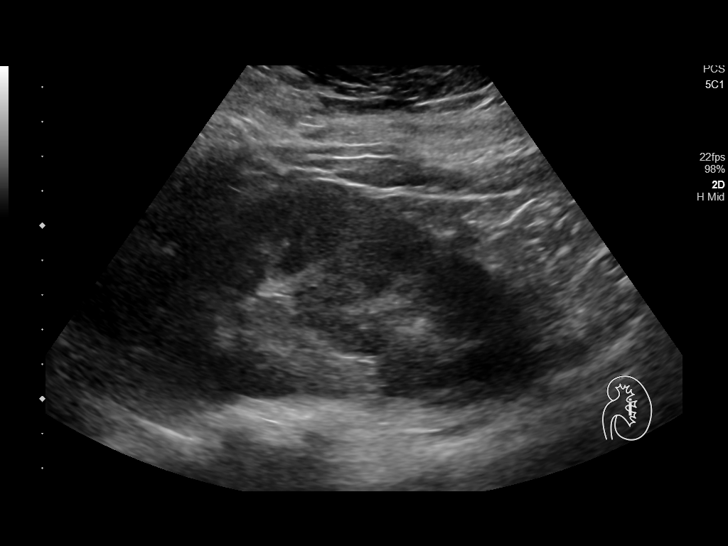
[im 17/31]
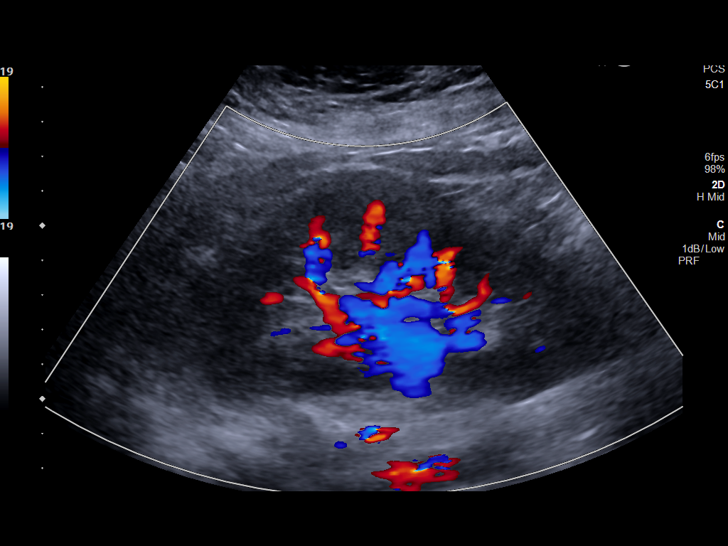
[im 19/31]
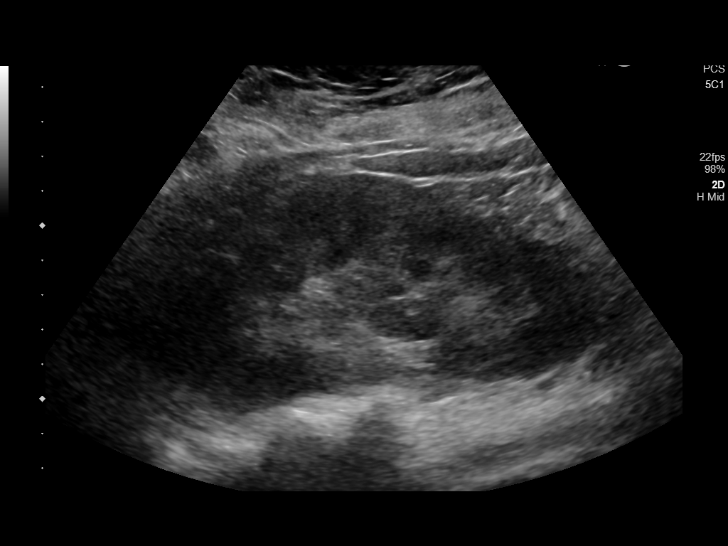
[im 21/31]
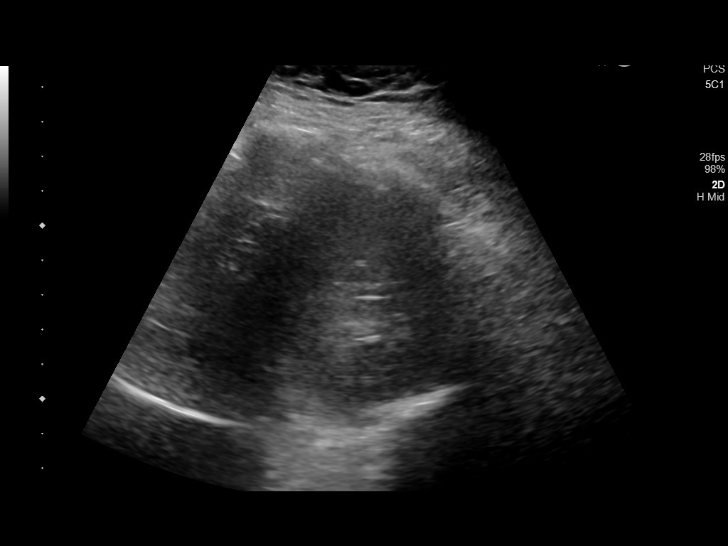
[im 23/31]
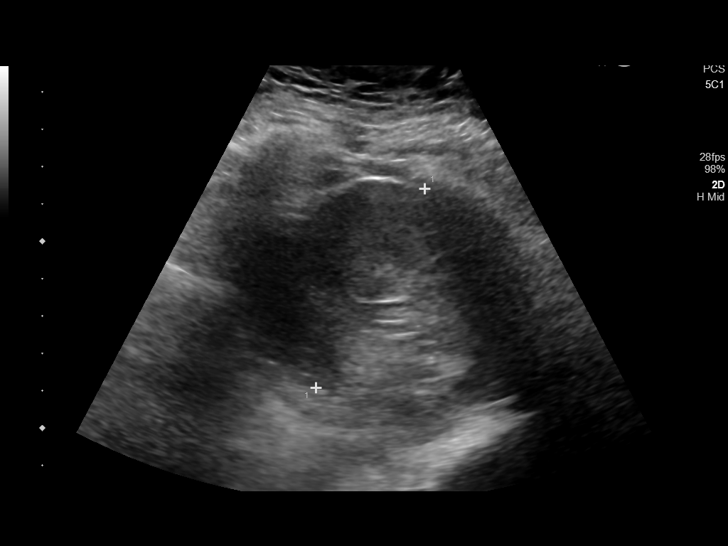
[im 26/31]
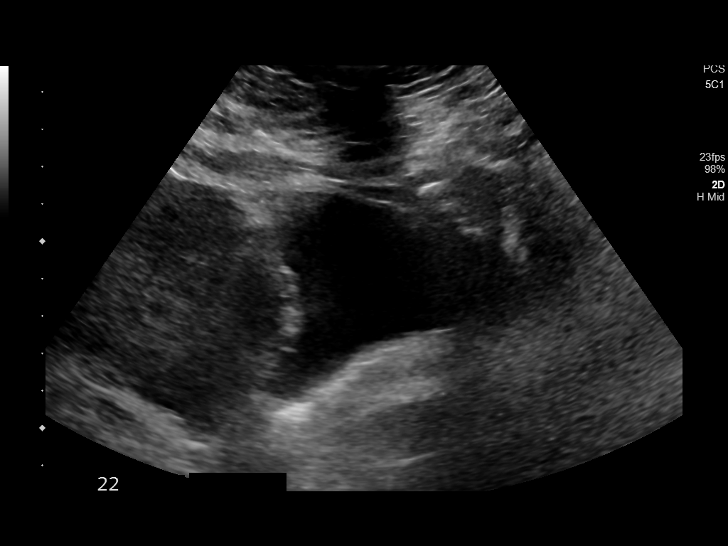
[im 28/31]
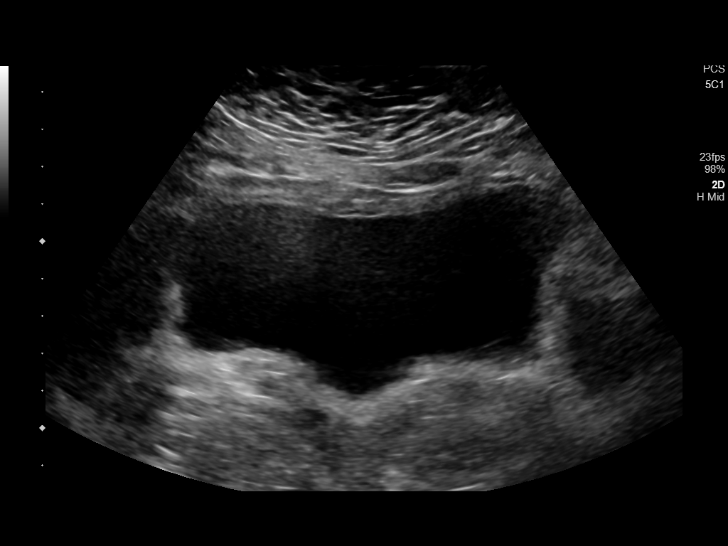
[im 31/31]
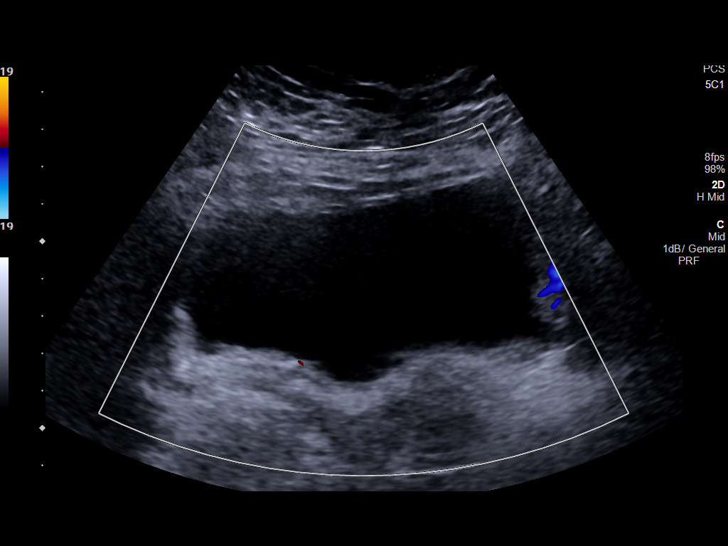

[14 of 25 positions shown; findings below may reference images not displayed]

FINDINGS: Right Kidney:

Renal measurements: 12.6 x 4.6 x 6.1 cm = volume: 185.3 mL .
Echogenicity within normal limits. No mass or hydronephrosis
visualized.

Left Kidney:

Renal measurements: 12.1 x 6.1 x 6.1 cm = volume: 235.4 mL.
Echogenicity within normal limits. No mass or hydronephrosis
visualized.

Bladder:

Appears normal for degree of bladder distention.
IMPRESSION: No hydronephrosis.

## 2020-10-06 IMAGING — US US OB LIMITED
1 series · 14 of 28 positions shown · non-contrast
Comparison: none

CLINICAL DATA: RIGHT flank pain today. By LMP the patient is 22
weeks 4 days.

EXAM:
LIMITED OBSTETRIC ULTRASOUND

[Series 1: us ob limited · 14 of 32 slices shown]
[im 2/32]
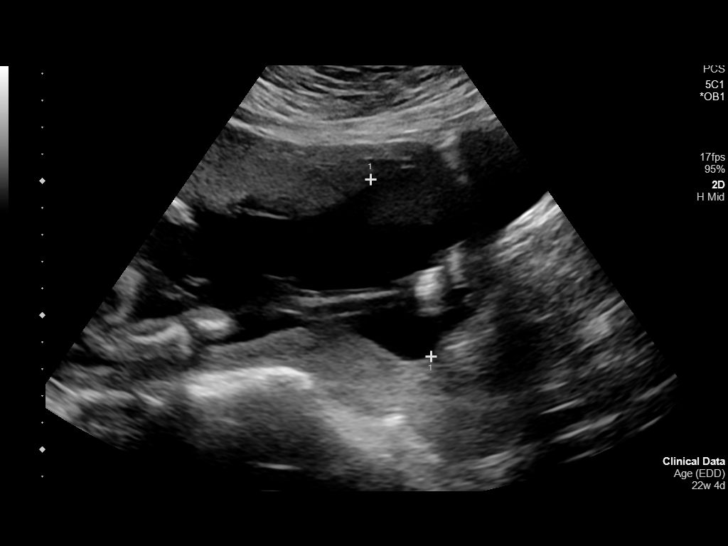
[im 4/32]
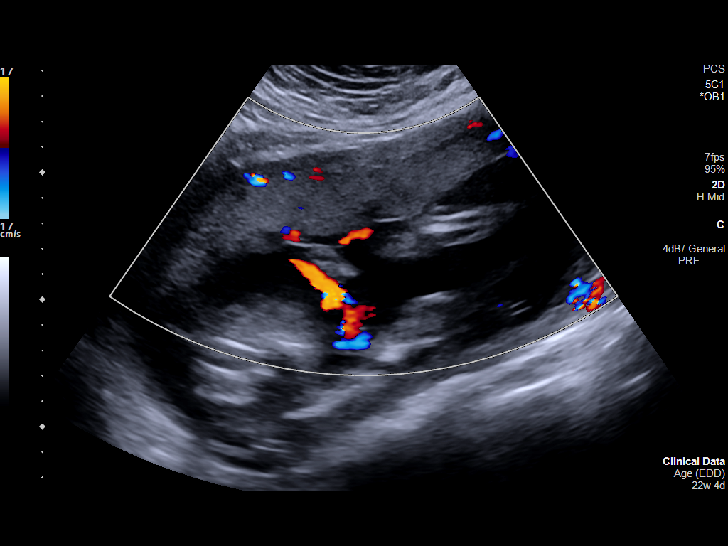
[im 6/32]
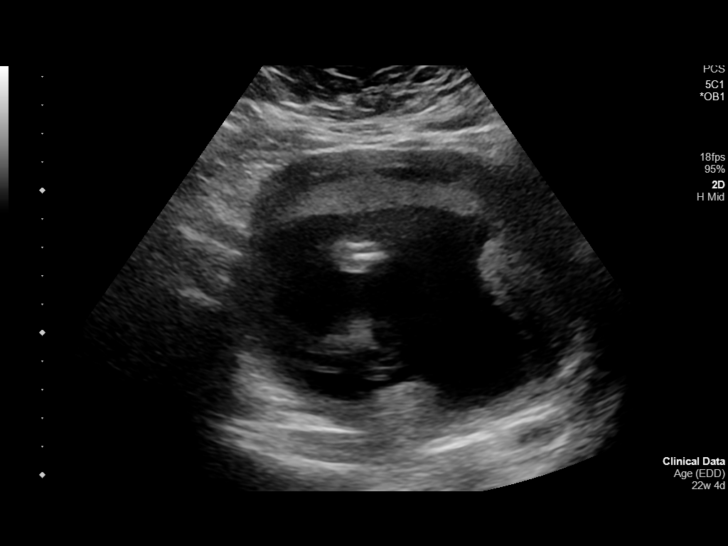
[im 9/32]
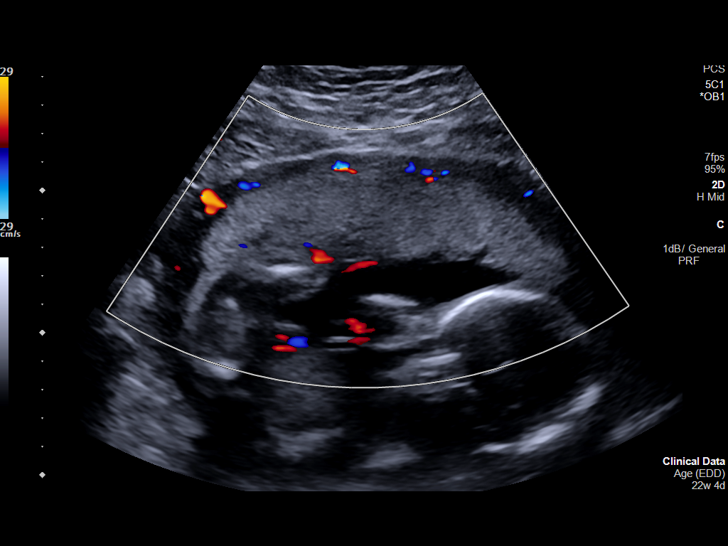
[im 11/32]
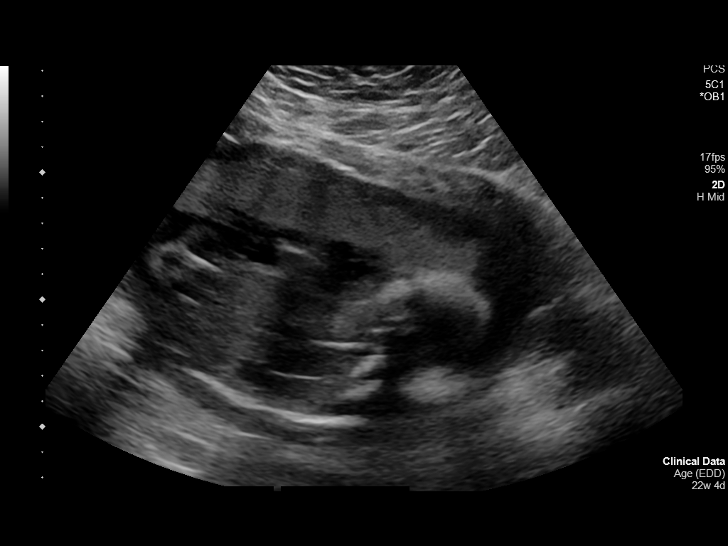
[im 13/32]
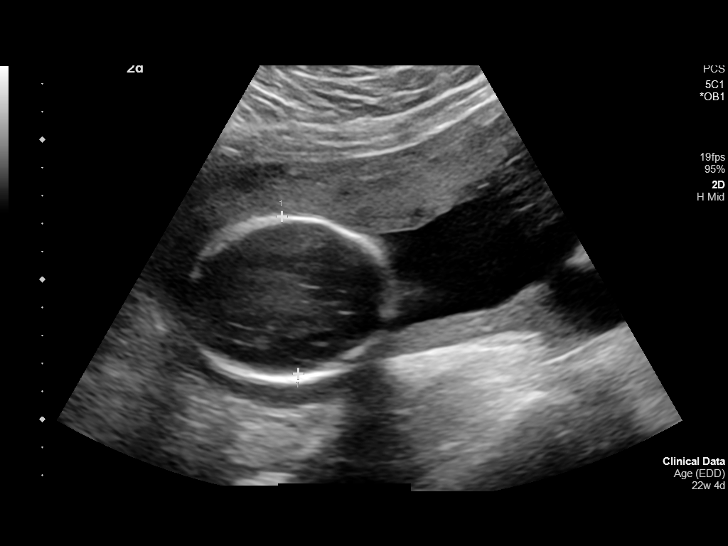
[im 15/32]
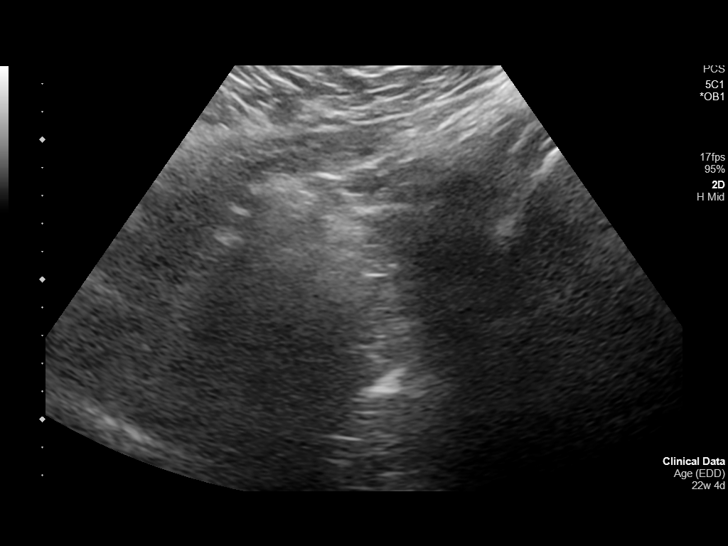
[im 18/32]
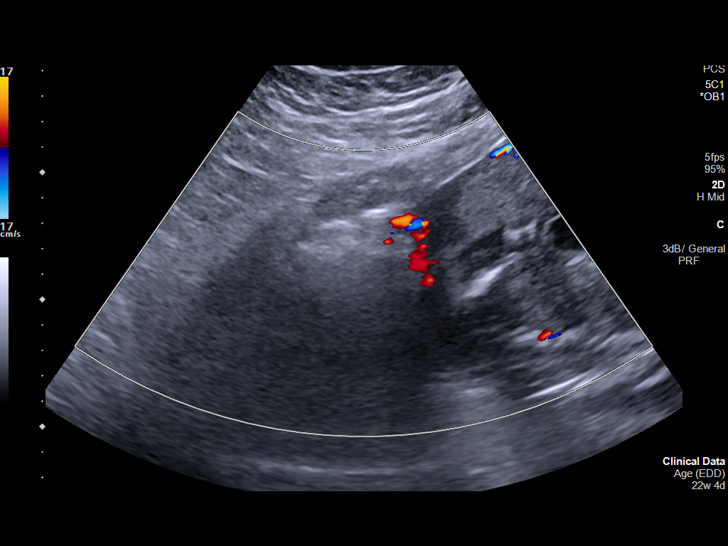
[im 20/32]
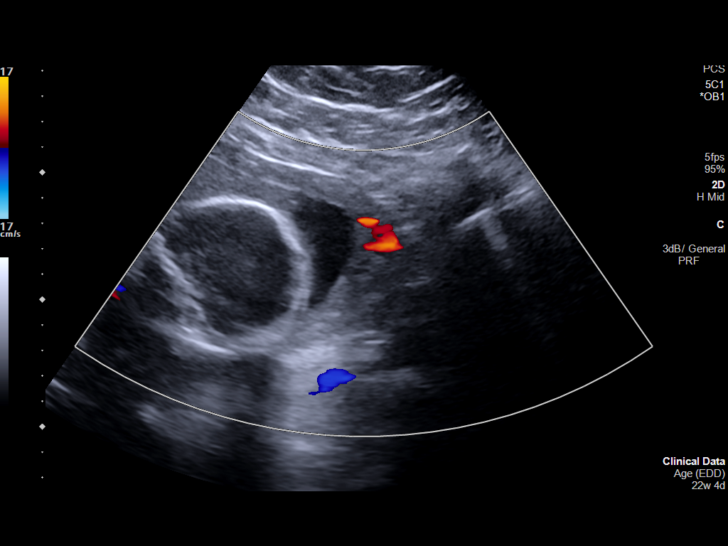
[im 22/32]
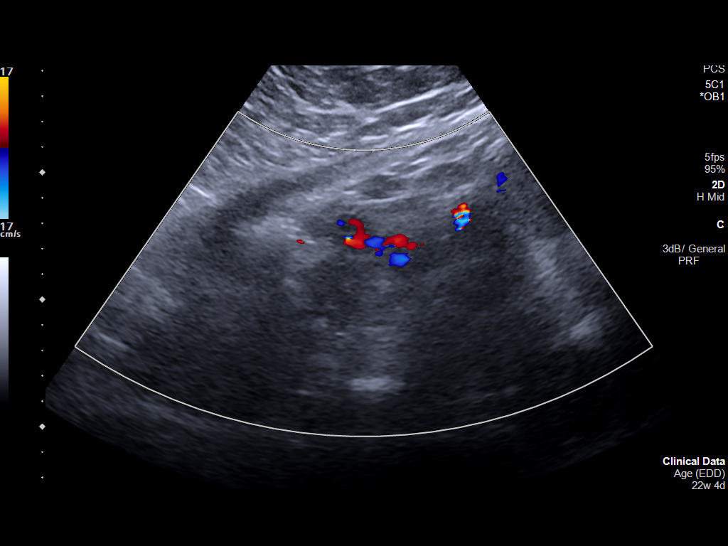
[im 25/32]
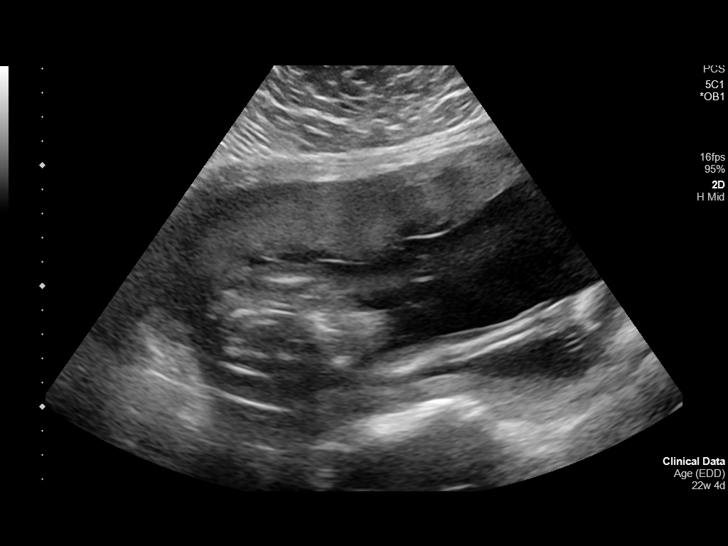
[im 27/32]
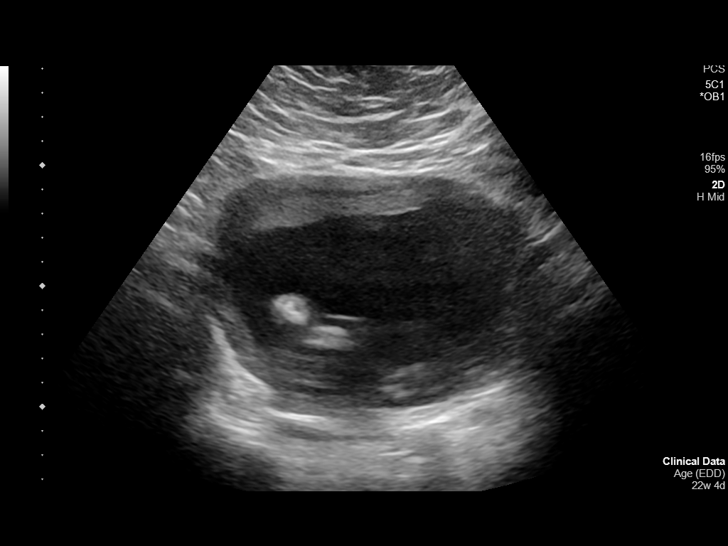
[im 29/32]
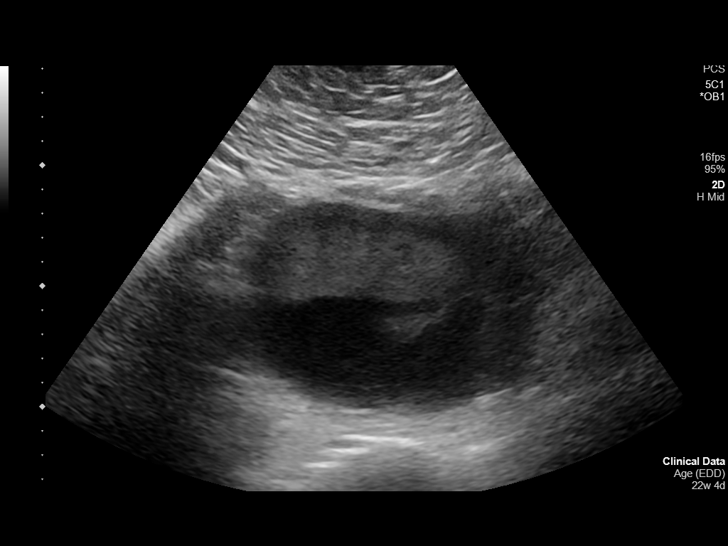
[im 32/32]
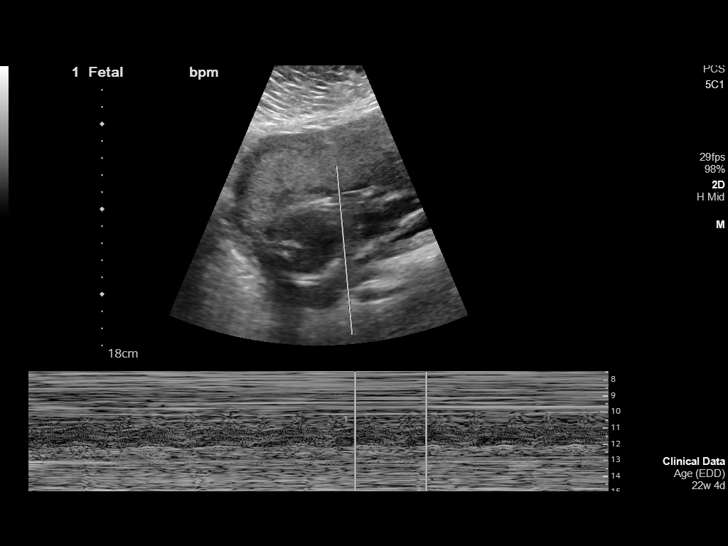

[14 of 28 positions shown; findings below may reference images not displayed]

FINDINGS: Number of Fetuses: 1

Heart Rate:  144 bpm

Movement: Present

Presentation: Transverse, head to the maternal LEFT.

Placental Location: Anterior

Previa: None

Amniotic Fluid (Subjective):  Normal

AFI: Not requested

BPD: 5.6 cm 23 w  1 d

MATERNAL FINDINGS:

Cervix:  Appears closed.

Uterus/Adnexae: No abnormality visualized.
IMPRESSION: Single living intrauterine fetus in transverse presentation.

Limited biometry correlates well with clinical dating.

Amniotic fluid volume is normal.

This exam is performed on an emergent basis and does not
comprehensively evaluate fetal size, dating, or anatomy; follow-up
complete OB US should be considered if further fetal assessment is
warranted.

## 2020-10-09 ENCOUNTER — Telehealth: Payer: Self-pay

## 2020-10-09 NOTE — Telephone Encounter (Signed)
Pt called and states that she would like a refill on levonorgestrel-ethinyl estradiol (ALESSE) 0.1-20 MG-MCG tablet -she states that when she had a VV with Claris Che in April, she had stated that she was not currently taking that medication but she wants to take it now. She has a CPE scheduled for 11/04/20. Her pharmacy is [facility] on [address]

## 2020-10-10 ENCOUNTER — Other Ambulatory Visit: Payer: Self-pay | Admitting: Family

## 2020-10-10 ENCOUNTER — Encounter: Payer: Self-pay | Admitting: Family

## 2020-10-10 DIAGNOSIS — Z3009 Encounter for other general counseling and advice on contraception: Secondary | ICD-10-CM

## 2020-10-10 MED ORDER — LEVONORGESTREL-ETHINYL ESTRAD 0.1-20 MG-MCG PO TABS: 1.0000 | ORAL_TABLET | Freq: Every day | ORAL | 3 refills | Status: DC

## 2020-10-10 NOTE — Telephone Encounter (Signed)
Call pt Refilled alesse  ( birth control) Please ensure she keeps CPE in august

## 2020-10-10 NOTE — Progress Notes (Signed)
Pap UTD BP 132/84  Non smoker  Refilled alesse

## 2020-10-10 NOTE — Telephone Encounter (Signed)
Pt called and advised that medication was refilled & to please keep CPE appointment.

## 2020-11-04 ENCOUNTER — Ambulatory Visit (INDEPENDENT_AMBULATORY_CARE_PROVIDER_SITE_OTHER): Payer: BC Managed Care – PPO | Admitting: Family

## 2020-11-04 ENCOUNTER — Other Ambulatory Visit: Payer: Self-pay

## 2020-11-04 ENCOUNTER — Encounter: Payer: Self-pay | Admitting: Family

## 2020-11-04 ENCOUNTER — Telehealth (INDEPENDENT_AMBULATORY_CARE_PROVIDER_SITE_OTHER): Payer: BC Managed Care – PPO | Admitting: Psychology

## 2020-11-04 ENCOUNTER — Ambulatory Visit (INDEPENDENT_AMBULATORY_CARE_PROVIDER_SITE_OTHER): Payer: BC Managed Care – PPO | Admitting: Psychology

## 2020-11-04 VITALS — BP 100/70 | HR 88 | Temp 98.6°F | Ht 64.0 in | Wt 276.6 lb

## 2020-11-04 DIAGNOSIS — Z0000 Encounter for general adult medical examination without abnormal findings: Secondary | ICD-10-CM | POA: Diagnosis not present

## 2020-11-04 NOTE — Assessment & Plan Note (Signed)
Chronic.  Suspect interrupted sleep is largely contributing.  Pending thorough lab evaluation to look for metabolic etiology.  Discussed trazodone.  Patient  declines prescription medication at this time.  She is most comfortable with a trial of melatonin over-the-counter.  Discussed sleep hygiene.  Advised patient to return to clinic if fatigue does not improve with improved sleep for further work-up.

## 2020-11-04 NOTE — Patient Instructions (Addendum)
Essentials for good sleep:   #1 Exercise #2 Limit Caffeine ( no caffeine after lunch) #3 No smart phones, TV prior to bed -- BLUE light is VERY activating and send the brain an 'awake message.'  #4 Go to bed at same time of night each night and get up at same time of day.  #5 Take 0.5 to 5mg  melatonin at 7pm with dinner -this is when natural melatonin will start to increase #6 May try over the counter Unisom for sleep aid   This is  Dr. 8 May  example of a  "Low GI"  Diet:  It will allow you to lose 4 to 8  lbs  per month if you follow it carefully.  Your goal with exercise is a minimum of 30 minutes of aerobic exercise 5 days per week (Walking does not count once it becomes easy!)    All of the foods can be found at grocery stores and in bulk at [employer].  The Atkins protein bars and shakes are available in more varieties at Target, WalMart and Lowe's Foods.     7 AM Breakfast:  Choose from the following:  Low carbohydrate Protein  Shakes (I recommend the  Premier Protein chocolate shakes,  EAS AdvantEdge "Carb Control" shakes  Or the Atkins shakes all are under 3 net carbs)     a scrambled egg/bacon/cheese burrito made with Mission's "carb balance" whole wheat tortilla  (about 10 net carbs )  Rohm [employer] Haas (basically a quiche without the pastry crust) that is eaten cold and very convenient way to get your eggs.  8 carbs)  If you make your own protein shakes, avoid bananas and pineapple,  And use low carb greek yogurt or original /unsweetened almond or soy milk    Avoid cereal and bananas, oatmeal and cream of wheat and grits. They are loaded with carbohydrates!   10 AM: high protein snack:  Protein bar by Atkins (the snack size, under 200 cal, usually < 6 net carbs).    A stick of cheese:  Around 1 carb,  100 cal     Dannon Light n Fit Medical laboratory scientific officer Yogurt  (80 cal, 8 carbs)  Other so called "protein bars" and Greek yogurts tend to be loaded with carbohydrates.   Remember, in food advertising, the word "energy" is synonymous for " carbohydrate."  Lunch:   A Sandwich using the bread choices listed, Can use any  Eggs,  lunchmeat, grilled meat or canned tuna), avocado, regular mayo/mustard  and cheese.  A Salad using blue cheese, ranch,  Goddess or vinagrette,  Avoid taco shells, croutons or "confetti" and no "candied nuts" but regular nuts OK.   No pretzels, nabs  or chips.  Pickles and miniature sweet peppers are a good low carb alternative that provide a "crunch"  The bread is the only source of carbohydrate in a sandwich and  can be decreased by trying some of the attached alternatives to traditional loaf bread   Avoid "Low fat dressings, as well as [place] and Reyne Dumas dressings They are loaded with sugar!   3 PM/ Mid day  Snack:  Consider  1 ounce of  almonds, walnuts, pistachios, pecans, peanuts,  Macadamia nuts or a nut medley.  Avoid "granola and granola bars "  Mixed nuts are ok in moderation as long as there are no raisins,  cranberries or dried fruit.   KIND bars are OK if you get the low glycemic index variety  Try the prosciutto/mozzarella cheese sticks by [employer]  In deli /backery section   High protein      6 PM  Dinner:     Meat/fowl/fish with a green salad, and either broccoli, cauliflower, green beans, spinach, brussel sprouts or  Lima beans. DO NOT BREAD THE PROTEIN!!      There is a low carb pasta by Dreamfield's that is acceptable and tastes great: only 5 digestible carbs/serving.( All grocery stores but BJs carry it ) Several ready made meals are available low carb:   Try Michel Angelo's chicken piccata or chicken or eggplant parm over low carb pasta.(Lowes and BJs)   Clifton Custard Sanchez's "Carnitas" (pulled pork, no sauce,  0 carbs) or his beef pot roast to make a dinner burrito (at BJ's)  Pesto over low carb pasta (bj's sells a good quality pesto in the center refrigerated section of the deli   Try satueeing  Roosvelt Harps with  mushroooms as a good side   [employer] Giant makes a mashed cauliflower that tastes like mashed potatoes  Whole wheat pasta is still full of digestible carbs and  Not as low in glycemic index as Dreamfield's.   Brown rice is still rice,  So skip the rice and noodles if you eat [place] or [place] (or at least limit to 1/2 cup)  9 PM snack :   Breyer's "low carb" fudgsicle or  ice cream bar (Carb Smart line), or  Weight Watcher's ice cream bar , or another "no sugar added" ice cream;  a serving of fresh berries/cherries with whipped cream   Cheese or DANNON'S LlGHT N FIT GREEK YOGURT  8 ounces of [place] unsweetened almond/cococunut milk    Treat yourself to a parfait made with whipped cream blueberiies, walnuts and vanilla greek yogurt  Avoid bananas, pineapple, grapes  and watermelon on a regular basis because they are high in sugar.  THINK OF THEM AS DESSERT  Remember that snack Substitutions should be less than 10 NET carbs per serving and meals < 20 carbs. Remember to subtract fiber grams to get the "net carbs."   Health Maintenance, Female Adopting a healthy lifestyle and getting preventive care are important in promoting health and wellness. Ask your health care provider about: The right schedule for you to have regular tests and exams. Things you can do on your own to prevent diseases and keep yourself healthy. What should I know about diet, weight, and exercise? Eat a healthy diet  Eat a diet that includes plenty of vegetables, fruits, low-fat dairy products, and lean protein. Do not eat a lot of foods that are high in solid fats, added sugars, or sodium.  Maintain a healthy weight Body mass index (BMI) is used to identify weight problems. It estimates body fat based on height and weight. Your health care provider can help determineyour BMI and help you achieve or maintain a healthy weight. Get regular exercise Get regular exercise. This is one of the most important things you can  do for your health. Most adults should: Exercise for at least 150 minutes each week. The exercise should increase your heart rate and make you sweat (moderate-intensity exercise). Do strengthening exercises at least twice a week. This is in addition to the moderate-intensity exercise. Spend less time sitting. Even light physical activity can be beneficial. Watch cholesterol and blood lipids Have your blood tested for lipids and cholesterol at 33 years of age, then havethis test every 5 years. Have your cholesterol levels checked more  often if: Your lipid or cholesterol levels are high. You are older than 33 years of age. You are at high risk for heart disease. What should I know about cancer screening? Depending on your health history and family history, you may need to have cancer screening at various ages. This may include screening for: Breast cancer. Cervical cancer. Colorectal cancer. Skin cancer. Lung cancer. What should I know about heart disease, diabetes, and high blood pressure? Blood pressure and heart disease High blood pressure causes heart disease and increases the risk of stroke. This is more likely to develop in people who have high blood pressure readings, are of African descent, or are overweight. Have your blood pressure checked: Every 3-5 years if you are 7418-33 years of age. Every year if you are 33 years old or older. Diabetes Have regular diabetes screenings. This checks your fasting blood sugar level. Have the screening done: Once every three years after age 240 if you are at a normal weight and have a low risk for diabetes. More often and at a younger age if you are overweight or have a high risk for diabetes. What should I know about preventing infection? Hepatitis B If you have a higher risk for hepatitis B, you should be screened for this virus. Talk with your health care provider to find out if you are at risk forhepatitis B infection. Hepatitis C Testing is  recommended for: Everyone born from [id number] through 1963. Anyone with known risk factors for hepatitis C. Sexually transmitted infections (STIs) Get screened for STIs, including gonorrhea and chlamydia, if: You are sexually active and are younger than 33 years of age. You are older than 33 years of age and your health care provider tells you that you are at risk for this type of infection. Your sexual activity has changed since you were last screened, and you are at increased risk for chlamydia or gonorrhea. Ask your health care provider if you are at risk. Ask your health care provider about whether you are at high risk for HIV. Your health care provider may recommend a prescription medicine to help prevent HIV infection. If you choose to take medicine to prevent HIV, you should first get tested for HIV. You should then be tested every 3 months for as long as you are taking the medicine. Pregnancy If you are about to stop having your period (premenopausal) and you may become pregnant, seek counseling before you get pregnant. Take 400 to 800 micrograms (mcg) of folic acid every day if you become pregnant. Ask for birth control (contraception) if you want to prevent pregnancy. Osteoporosis and menopause Osteoporosis is a disease in which the bones lose minerals and strength with aging. This can result in bone fractures. If you are 33 years old or older, or if you are at risk for osteoporosis and fractures, ask your health care provider if you should: Be screened for bone loss. Take a calcium or vitamin D supplement to lower your risk of fractures. Be given hormone replacement therapy (HRT) to treat symptoms of menopause. Follow these instructions at home: Lifestyle Do not use any products that contain nicotine or tobacco, such as cigarettes, e-cigarettes, and chewing tobacco. If you need help quitting, ask your health care provider. Do not use street drugs. Do not share needles. Ask your health  care provider for help if you need support or information about quitting drugs. Alcohol use Do not drink alcohol if: Your health care provider tells you not to drink.  You are pregnant, may be pregnant, or are planning to become pregnant. If you drink alcohol: Limit how much you use to 0-1 drink a day. Limit intake if you are breastfeeding. Be aware of how much alcohol is in your drink. In the [place] one drink equals one 12 oz bottle of beer (355 mL), one 5 oz glass of wine (148 mL), or one 1 oz glass of hard liquor (44 mL). General instructions Schedule regular health, dental, and eye exams. Stay current with your vaccines. Tell your health care provider if: You often feel depressed. You have ever been abused or do not feel safe at home. Summary Adopting a healthy lifestyle and getting preventive care are important in promoting health and wellness. Follow your health care provider's instructions about healthy diet, exercising, and getting tested or screened for diseases. Follow your health care provider's instructions on monitoring your cholesterol and blood pressure. This information is not intended to replace advice given to you by your health care provider. Make sure you discuss any questions you have with your healthcare provider. Document Revised: 03/08/2018 Document Reviewed: 03/08/2018 Elsevier Patient Education  2022 [employer].

## 2020-11-04 NOTE — Assessment & Plan Note (Signed)
Deferred pelvic exam in the absence of complaints and Pap smear is up-to-date.  Clinical breast exam performed.  Encouraged continued exercise and congratulated patient on weight loss.

## 2020-11-04 NOTE — Progress Notes (Signed)
Subjective:    Patient ID: Cassandra Vazquez, female    DOB: 1985-09-27, 35 y.o.   MRN: [id number]  CC: Cassandra Vazquez is a 35 y.o. female who presents today for physical exam.    HPI: Trouble staying asleep, which is occurring every night.  She reports watching TV at night.  At times, she wakes up at 2am in part have been also related to her 8-year-old daughter.  LMP yesterday  Endorses fatigue.  No fever, chills  Vazquez is restorative.  No depression or anxiety.  She is on summer break as a Runner, broadcasting/film/video however she has been working in [employer] in the evenings.   Colorectal Cancer Screening: No family history of colon cancer.  No changes in bowel habits Breast Cancer Screening: No family history of breast cancer. Cervical Cancer Screening: UTD, negative malignancy, HPV last year.          Tetanus - utd       Labs: Screening labs today. Exercise: Gets regular exercise, works out 3 times per week. Using a trainer.  Alcohol use: 1 glass of wine at night Smoking/tobacco use: Nonsmoker.     HISTORY:  Past Medical History:  Diagnosis Date   Anemia    Bilateral swelling of feet    Constipation    History of A2 gestational diabetes 10/22/2016   Negative postpartum 2 hr GTT   Joint pain    Vitamin D deficiency     History reviewed. No pertinent surgical history. Family History  Problem Relation Age of Onset   Diabetes Mother    Obesity Mother    Diabetes Father    CVA Father    Stroke Father    Bipolar disorder Father    Obesity Father    Colon cancer Neg Hx    Breast cancer Neg Hx    Thyroid cancer Neg Hx       ALLERGIES: Patient has no known allergies.  Current Outpatient Medications on File Prior to Visit  Medication Sig Dispense Refill   levonorgestrel-ethinyl estradiol (ALESSE) 0.1-20 MG-MCG tablet Take 1 tablet by mouth daily. 90 tablet 3   No current facility-administered medications on file prior to visit.    Social History   Tobacco Use   Smoking status:  Never   Smokeless tobacco: Never  Vaping Use   Vaping Use: Never used  Substance Use Topics   Alcohol use: Yes    Comment: wine one glass per night   Drug use: No    Review of Systems  Constitutional:  Positive for fatigue. Negative for chills, fever and unexpected weight change.  HENT:  Negative for congestion.   Respiratory:  Negative for cough.   Cardiovascular:  Negative for chest pain, palpitations and leg swelling.  Gastrointestinal:  Negative for nausea and vomiting.  Musculoskeletal:  Negative for arthralgias and myalgias.  Skin:  Negative for rash.  Neurological:  Negative for headaches.  Hematological:  Negative for adenopathy.  Psychiatric/Behavioral:  Positive for Vazquez disturbance. Negative for confusion and suicidal ideas. The patient is not nervous/anxious.      Objective:    BP 100/70 (BP Location: Left Arm, Patient Position: Sitting, Cuff Size: Large)   Pulse 88   Temp 98.6 F (37 C) (Oral)   Ht 5\' 4"  (1.626 m)   Wt 276 lb 9.6 oz (125.5 kg)   SpO2 98%   BMI 47.48 kg/m   BP Readings from Last 3 Encounters:  11/04/20 100/70  08/06/20 132/84  06/23/20 130/81  Wt Readings from Last 3 Encounters:  11/04/20 276 lb 9.6 oz (125.5 kg)  08/06/20 279 lb (126.6 kg)  07/08/20 284 lb (128.8 kg)    Physical Exam Vitals reviewed.  Constitutional:      Appearance: Normal appearance. She is well-developed.  Eyes:     Conjunctiva/sclera: Conjunctivae normal.  Neck:     Thyroid: No thyroid mass or thyromegaly.  Cardiovascular:     Rate and Rhythm: Normal rate and regular rhythm.     Pulses: Normal pulses.     Heart sounds: Normal heart sounds.  Pulmonary:     Effort: Pulmonary effort is normal.     Breath sounds: Normal breath sounds. No wheezing, rhonchi or rales.  Chest:  Breasts:    Breasts are symmetrical.     Right: No inverted nipple, mass, nipple discharge, skin change or tenderness.     Left: No inverted nipple, mass, nipple discharge, skin change  or tenderness.  Abdominal:     General: Bowel sounds are normal. There is no distension.     Palpations: Abdomen is soft. Abdomen is not rigid. There is no fluid wave or mass.     Tenderness: There is no abdominal tenderness. There is no guarding or rebound.  Lymphadenopathy:     Head:     Right side of head: No submental, submandibular, tonsillar, preauricular, posterior auricular or occipital adenopathy.     Left side of head: No submental, submandibular, tonsillar, preauricular, posterior auricular or occipital adenopathy.     Cervical: No cervical adenopathy.     Right cervical: No superficial, deep or posterior cervical adenopathy.    Left cervical: No superficial, deep or posterior cervical adenopathy.  Skin:    General: Skin is warm and dry.  Neurological:     Mental Status: She is alert.  Psychiatric:        Speech: Speech normal.        Behavior: Behavior normal.        Thought Content: Thought content normal.       Assessment & Plan:   Problem List Items Addressed This Visit       Other   Routine physical examination - Primary    Deferred pelvic exam in the absence of complaints and Pap smear is up-to-date.  Clinical breast exam performed.  Encouraged continued exercise and congratulated patient on weight loss.       Relevant Orders   VITAMIN D 25 Hydroxy (Vit-D Deficiency, Fractures)   Hemoglobin A1c   TSH   CBC with Differential/Platelet   Comprehensive metabolic panel   Lipid panel   B12   Fatigue  Chronic.  Suspect interrupted Vazquez is largely contributing.  Pending thorough lab evaluation to look for metabolic etiology.  Discussed trazodone.  Patient  declines prescription medication at this time.  She is most comfortable with a trial of melatonin over-the-counter.  Discussed Vazquez hygiene.  Advised patient to return to clinic if fatigue does not improve with improved Vazquez for further work-up.   I am having Tajuanna Burnett maintain her  levonorgestrel-ethinyl estradiol.   No orders of the defined types were placed in this encounter.   Return precautions given.   Risks, benefits, and alternatives of the medications and treatment plan prescribed today were discussed, and patient expressed understanding.   Education regarding symptom management and diagnosis given to patient on AVS.   Continue to follow with Allegra Grana, FNP for routine health maintenance.   [name] and I agreed with  plan.   Rennie Plowman, FNP

## 2020-11-10 ENCOUNTER — Other Ambulatory Visit: Payer: Self-pay

## 2020-11-10 ENCOUNTER — Other Ambulatory Visit (INDEPENDENT_AMBULATORY_CARE_PROVIDER_SITE_OTHER): Payer: BC Managed Care – PPO

## 2020-11-10 DIAGNOSIS — Z0000 Encounter for general adult medical examination without abnormal findings: Secondary | ICD-10-CM | POA: Diagnosis not present

## 2020-11-10 LAB — COMPREHENSIVE METABOLIC PANEL
ALT: 18 U/L (ref 0–35)
AST: 14 U/L (ref 0–37)
Albumin: 4.1 g/dL (ref 3.5–5.2)
Alkaline Phosphatase: 46 U/L (ref 39–117)
BUN: 17 mg/dL (ref 6–23)
CO2: 25 mEq/L (ref 19–32)
Calcium: 9.3 mg/dL (ref 8.4–10.5)
Chloride: 102 mEq/L (ref 96–112)
Creatinine, Ser: 0.72 mg/dL (ref 0.40–1.20)
GFR: 109.86 mL/min (ref >60.00)
Glucose, Bld: 89 mg/dL (ref 70–99)
Potassium: 4.6 mEq/L (ref 3.5–5.1)
Sodium: 136 mEq/L (ref 135–145)
Total Bilirubin: 0.4 mg/dL (ref 0.2–1.2)
Total Protein: 6.9 g/dL (ref 6.0–8.3)

## 2020-11-10 LAB — CBC WITH DIFFERENTIAL/PLATELET
Basophils Absolute: 0 10*3/uL (ref 0.0–0.1)
Basophils Relative: 0.3 % (ref 0.0–3.0)
Eosinophils Absolute: 0.2 10*3/uL (ref 0.0–0.7)
Eosinophils Relative: 1.9 % (ref 0.0–5.0)
HCT: 34.8 % — ABNORMAL LOW (ref 36.0–46.0)
Hemoglobin: 11.4 g/dL — ABNORMAL LOW (ref 12.0–15.0)
Lymphocytes Relative: 25.9 % (ref 12.0–46.0)
Lymphs Abs: 3 10*3/uL (ref 0.7–4.0)
MCHC: 32.6 g/dL (ref 30.0–36.0)
MCV: 85.2 fl (ref 78.0–100.0)
Monocytes Absolute: 0.5 10*3/uL (ref 0.1–1.0)
Monocytes Relative: 4.5 % (ref 3.0–12.0)
Neutro Abs: 7.8 10*3/uL — ABNORMAL HIGH (ref 1.4–7.7)
Neutrophils Relative %: 67.4 % (ref 43.0–77.0)
Platelets: 420 10*3/uL — ABNORMAL HIGH (ref 150.0–400.0)
RBC: 4.09 Mil/uL (ref 3.87–5.11)
RDW: 14.2 % (ref 11.5–15.5)
WBC: 11.5 10*3/uL — ABNORMAL HIGH (ref 4.0–10.5)

## 2020-11-10 LAB — LIPID PANEL
Cholesterol: 192 mg/dL (ref 0–200)
HDL: 60.5 mg/dL (ref >39.00)
LDL Cholesterol: 108 mg/dL — ABNORMAL HIGH (ref 0–99)
NonHDL: 131.5
Total CHOL/HDL Ratio: 3
Triglycerides: 116 mg/dL (ref 0.0–149.0)
VLDL: 23.2 mg/dL (ref 0.0–40.0)

## 2020-11-10 LAB — VITAMIN D 25 HYDROXY (VIT D DEFICIENCY, FRACTURES): VITD: 16.9 ng/mL — ABNORMAL LOW (ref 30.00–100.00)

## 2020-11-10 LAB — TSH: TSH: 3.22 u[IU]/mL (ref 0.35–5.50)

## 2020-11-10 LAB — HEMOGLOBIN A1C: Hgb A1c MFr Bld: 5.5 % (ref 4.6–6.5)

## 2020-11-10 LAB — VITAMIN B12: Vitamin B-12: 339 pg/mL (ref 211–911)

## 2020-11-12 ENCOUNTER — Other Ambulatory Visit: Payer: Self-pay | Admitting: Family

## 2020-11-12 DIAGNOSIS — D649 Anemia, unspecified: Secondary | ICD-10-CM

## 2020-11-12 DIAGNOSIS — R7989 Other specified abnormal findings of blood chemistry: Secondary | ICD-10-CM

## 2020-11-12 MED ORDER — CHOLECALCIFEROL 1.25 MG (50000 UT) PO TABS: ORAL_TABLET | ORAL | 0 refills | Status: DC

## 2020-11-13 ENCOUNTER — Other Ambulatory Visit: Payer: Self-pay | Admitting: Family

## 2020-11-13 DIAGNOSIS — R7989 Other specified abnormal findings of blood chemistry: Secondary | ICD-10-CM

## 2020-12-15 ENCOUNTER — Telehealth: Payer: Self-pay | Admitting: Family

## 2020-12-15 NOTE — Telephone Encounter (Signed)
Call pt  She is current pt of Dr Smith Robert seen last year She just needs to call to sch  [phone]

## 2020-12-15 NOTE — Telephone Encounter (Signed)
Patient called in stating that she has not heard from [facility] and would like for a request to be sent over to their office like [facility] has done before in the past.Please advise.

## 2020-12-15 NOTE — Telephone Encounter (Signed)
It looks like referral was placed to hematology, but appointments were continuously cancelled. Will you place referral back to them?

## 2020-12-15 NOTE — Telephone Encounter (Signed)
I called patient & she stated that she had left a message at Dr. Assunta Gambles office to be called back for an appointment, but had not heard. Dr. Assunta Gambles office was able to schedule patient 01/13/21 & patient aware of date as well as time.

## 2020-12-20 IMAGING — US US MFM OB FOLLOW UP
1 series · 14 of 28 positions shown · non-contrast
Comparison: none

[Series 1: us mfm ob follow up · 14 of 71 slices shown]
[im 3/71]
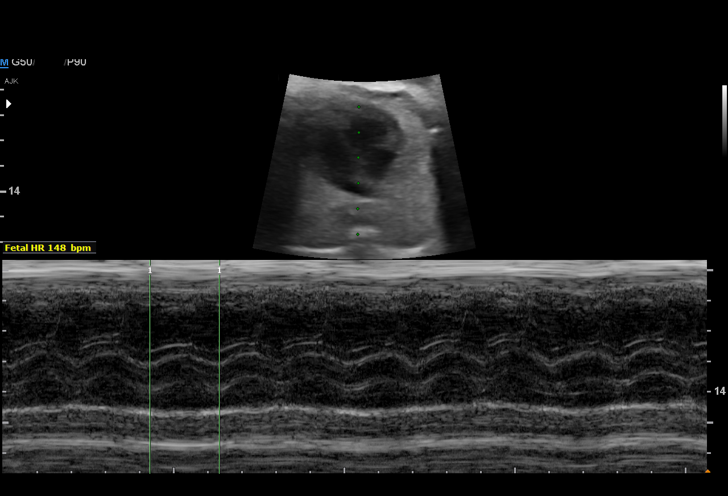
[im 8/71]
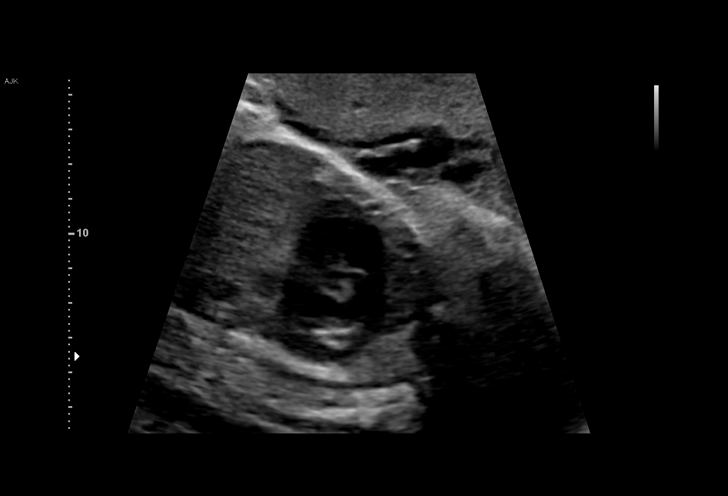
[im 13/71]
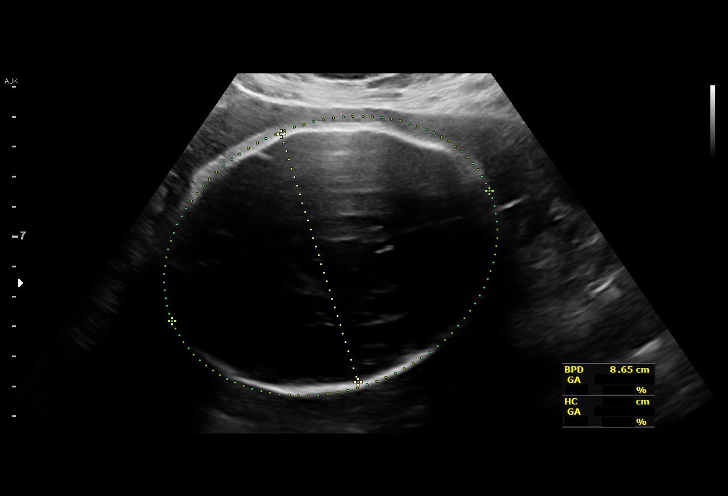
[im 19/71]
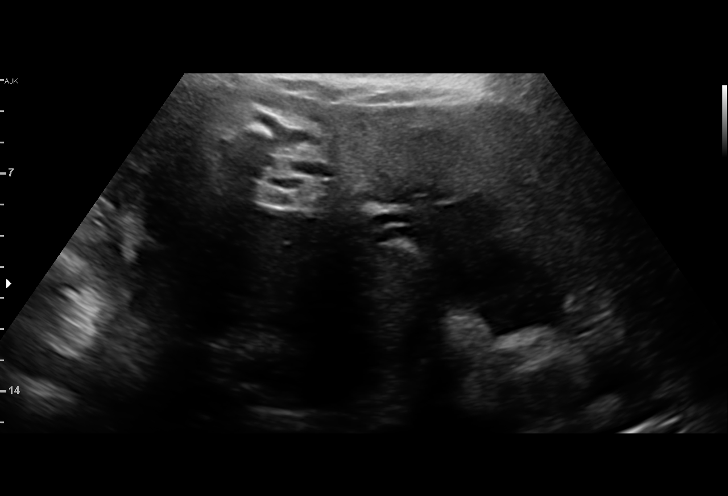
[im 24/71]
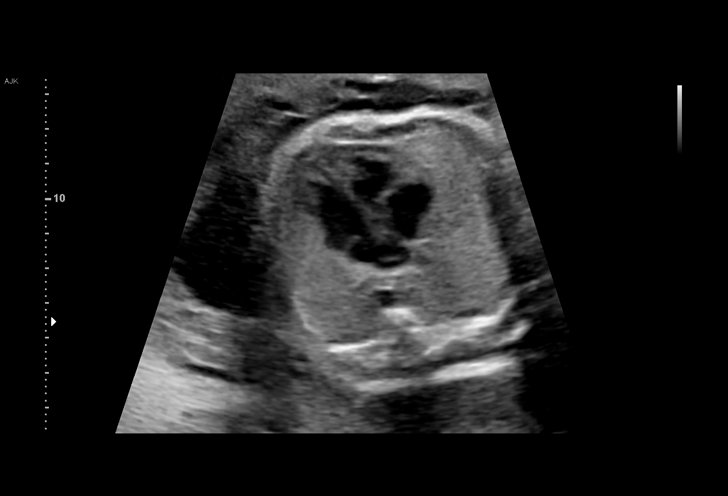
[im 29/71]
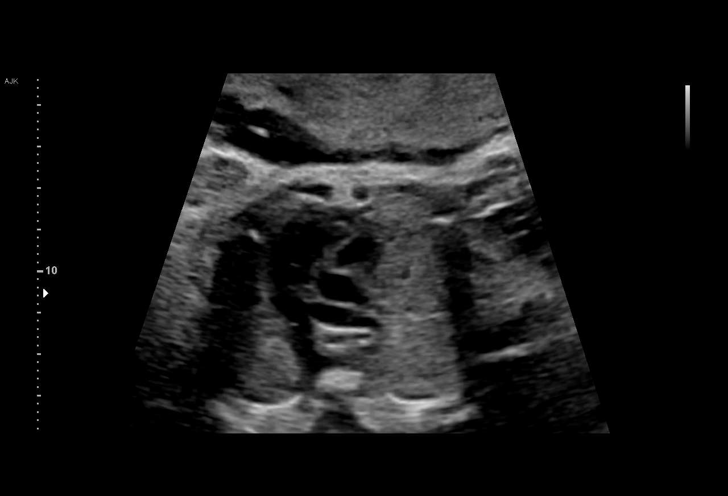
[im 34/71]
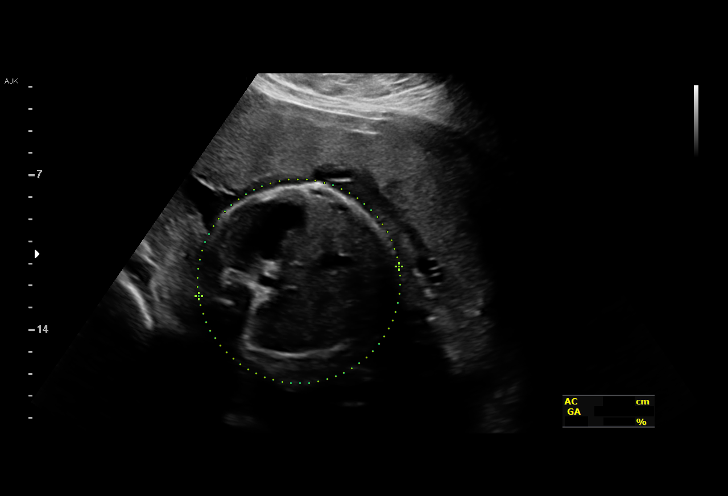
[im 39/71]
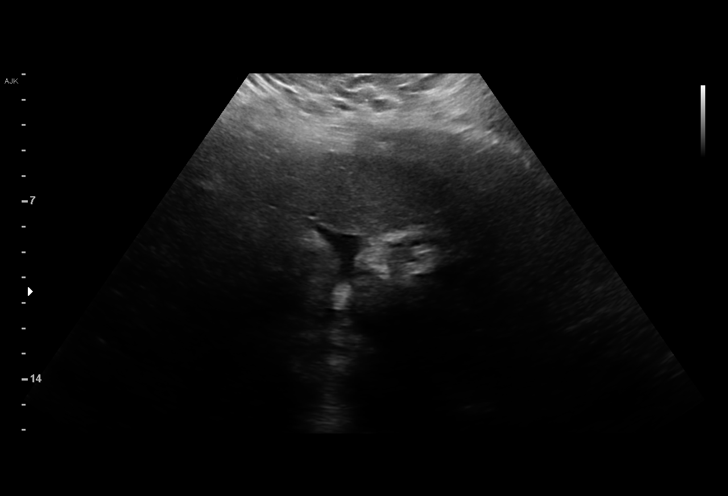
[im 45/71]
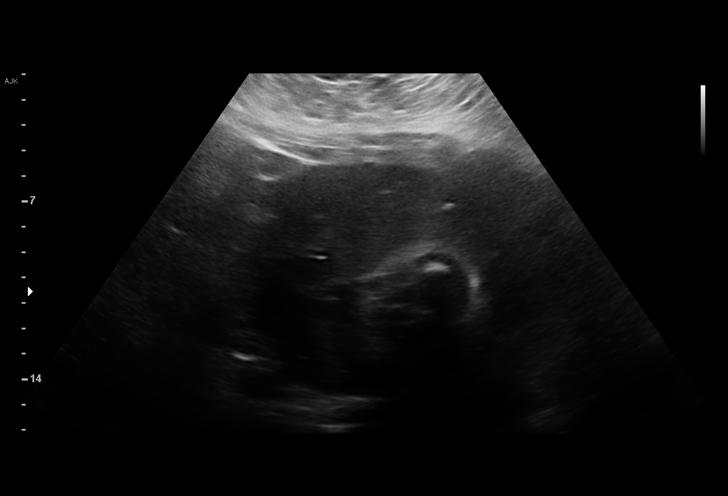
[im 50/71]
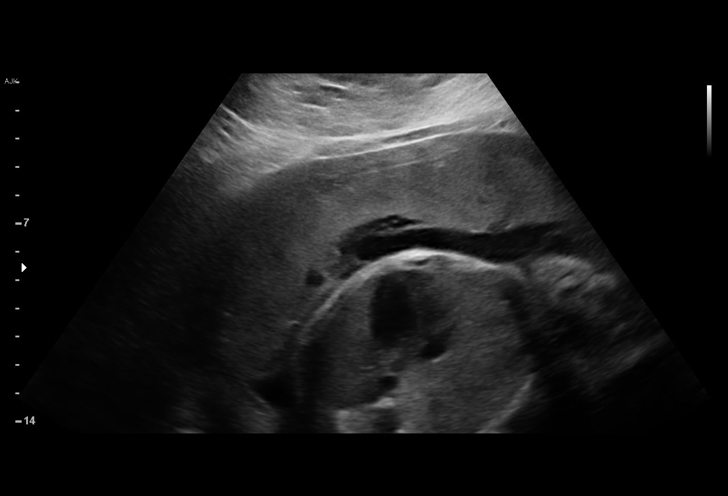
[im 55/71]
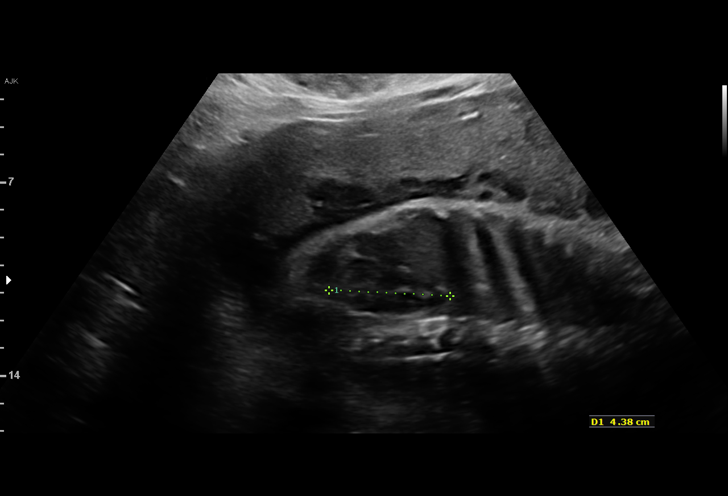
[im 60/71]
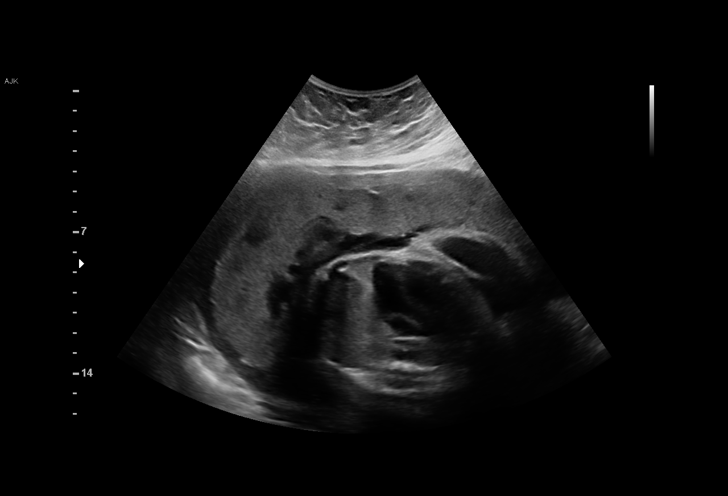
[im 65/71]
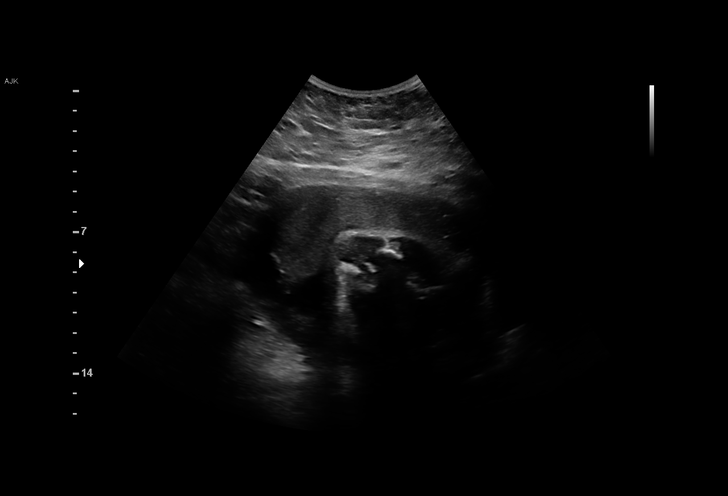
[im 71/71]
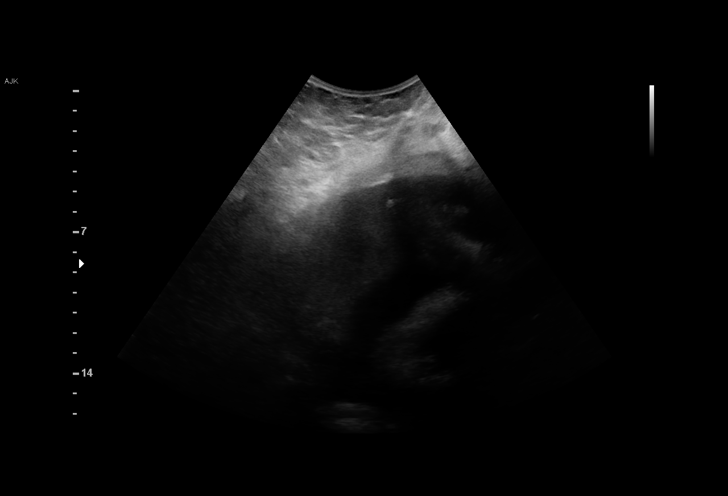

[14 of 28 positions shown; findings below may reference images not displayed]

Performed By:     Neriane Saes          Secondary Phy.:   [REDACTED]
 Ref. Address:     [REDACTED]

 ----------------------------------------------------------------------

 ----------------------------------------------------------------------
Indications

  Poor obstetric history: Previous gestational
  diabetes
  Obesity complicating pregnancy, third
  trimester
  Encounter for other antenatal screening
  follow-up
  33 weeks gestation of pregnancy
 ----------------------------------------------------------------------
Vital Signs

                                                Height:        5'4"
Fetal Evaluation

 Num Of Fetuses:         1
 Fetal Heart Rate(bpm):  148
 Cardiac Activity:       Observed

 Amniotic Fluid
 AFI FV:      Within normal limits

 AFI Sum(cm)     %Tile       Largest Pocket(cm)
 12.02           33

 RUQ(cm)       RLQ(cm)       LUQ(cm)        LLQ(cm)

Biometry
 BPD:      86.5  mm     G. Age:  34w 6d         87  %    CI:        72.94   %    70 - 86
                                                         FL/HC:      21.0   %    19.9 -
 HC:       322   mm     G. Age:  36w 3d         87  %    HC/AC:      1.16        0.96 -
 AC:      277.5  mm     G. Age:  31w 6d         14  %    FL/BPD:     78.0   %    71 - 87
 FL:       67.5  mm     G. Age:  34w 5d         76  %    FL/AC:      24.3   %    20 - 24

 Est. FW:    9050  gm    4 lb 13 oz      61  %
OB History

 Gravidity:    4         Term:   3
 Living:       3
Gestational Age

 LMP:           33w 2d        Date:  11/29/17                 EDD:   09/05/18
 U/S Today:     34w 3d                                        EDD:   08/28/18
 Best:          33w 2d     Det. By:  LMP  (11/29/17)          EDD:   09/05/18
Anatomy

 Cranium:               Appears normal         Aortic Arch:            Not well visualized
 Cavum:                 Appears normal         Ductal Arch:            Appears normal
 Ventricles:            Previously seen        Diaphragm:              Appears normal
 Choroid Plexus:        Previously seen        Stomach:                Appears normal, left
                                                                       sided
 Cerebellum:            Previously seen        Abdomen:                Appears normal
 Posterior Fossa:       Previously seen        Abdominal Wall:         Appears nml (cord
                                                                       insert, abd wall)
 Nuchal Fold:           Previously seen        Cord Vessels:           Appears normal (3
                                                                       vessel cord)
 Face:                  Appears normal         Kidneys:                Appear normal
                        (orbits and profile)
 Lips:                  Previously seen        Bladder:                Appears normal
 Thoracic:              Appears normal         Spine:                  Not well visualized
 Heart:                 Appears normal         Upper Extremities:      Previously seen
                        (4CH, axis, and
                        situs)
 RVOT:                  Appears normal         Lower Extremities:      Previously seen
 LVOT:                  Appears normal

 Other:  Female gender Technically difficult due to maternal habitus and fetal
         position.
Impression

 Normal interval growth.
 Suboptimal views of the fetal spine
Recommendations

 Follow up growth anatomy in 4 weeks.

## 2021-01-08 ENCOUNTER — Other Ambulatory Visit: Payer: Self-pay

## 2021-01-08 DIAGNOSIS — D649 Anemia, unspecified: Secondary | ICD-10-CM

## 2021-01-08 DIAGNOSIS — D729 Disorder of white blood cells, unspecified: Secondary | ICD-10-CM

## 2021-01-12 ENCOUNTER — Inpatient Hospital Stay: Payer: BC Managed Care – PPO

## 2021-01-13 ENCOUNTER — Other Ambulatory Visit: Payer: Self-pay

## 2021-01-13 ENCOUNTER — Inpatient Hospital Stay: Payer: BC Managed Care – PPO | Attending: Oncology | Admitting: Oncology

## 2021-01-13 ENCOUNTER — Inpatient Hospital Stay: Payer: BC Managed Care – PPO

## 2021-01-13 ENCOUNTER — Encounter: Payer: Self-pay | Admitting: Oncology

## 2021-01-13 ENCOUNTER — Other Ambulatory Visit: Payer: BC Managed Care – PPO

## 2021-01-13 VITALS — BP 117/75 | HR 64 | Temp 96.9°F | Resp 20 | Wt 273.4 lb

## 2021-01-13 DIAGNOSIS — Z8349 Family history of other endocrine, nutritional and metabolic diseases: Secondary | ICD-10-CM | POA: Insufficient documentation

## 2021-01-13 DIAGNOSIS — Z818 Family history of other mental and behavioral disorders: Secondary | ICD-10-CM | POA: Insufficient documentation

## 2021-01-13 DIAGNOSIS — D649 Anemia, unspecified: Secondary | ICD-10-CM

## 2021-01-13 DIAGNOSIS — Z79899 Other long term (current) drug therapy: Secondary | ICD-10-CM | POA: Insufficient documentation

## 2021-01-13 DIAGNOSIS — D75839 Thrombocytosis, unspecified: Secondary | ICD-10-CM | POA: Insufficient documentation

## 2021-01-13 DIAGNOSIS — D729 Disorder of white blood cells, unspecified: Secondary | ICD-10-CM

## 2021-01-13 DIAGNOSIS — D720 Genetic anomalies of leukocytes: Secondary | ICD-10-CM | POA: Insufficient documentation

## 2021-01-13 DIAGNOSIS — Z8249 Family history of ischemic heart disease and other diseases of the circulatory system: Secondary | ICD-10-CM | POA: Insufficient documentation

## 2021-01-13 DIAGNOSIS — R7989 Other specified abnormal findings of blood chemistry: Secondary | ICD-10-CM

## 2021-01-13 DIAGNOSIS — Z793 Long term (current) use of hormonal contraceptives: Secondary | ICD-10-CM | POA: Insufficient documentation

## 2021-01-13 DIAGNOSIS — Z833 Family history of diabetes mellitus: Secondary | ICD-10-CM | POA: Diagnosis not present

## 2021-01-13 DIAGNOSIS — R5383 Other fatigue: Secondary | ICD-10-CM | POA: Insufficient documentation

## 2021-01-13 DIAGNOSIS — Z823 Family history of stroke: Secondary | ICD-10-CM | POA: Insufficient documentation

## 2021-01-13 LAB — COMPREHENSIVE METABOLIC PANEL
ALT: 23 U/L (ref 0–44)
AST: 28 U/L (ref 15–41)
Albumin: 3.8 g/dL (ref 3.5–5.0)
Alkaline Phosphatase: 60 U/L (ref 38–126)
Anion gap: 6 (ref 5–15)
BUN: 10 mg/dL (ref 6–20)
CO2: 25 mmol/L (ref 22–32)
Calcium: 8.5 mg/dL — ABNORMAL LOW (ref 8.9–10.3)
Chloride: 104 mmol/L (ref 98–111)
Creatinine, Ser: 0.7 mg/dL (ref 0.44–1.00)
GFR, Estimated: >60 mL/min (ref >60)
Glucose, Bld: 85 mg/dL (ref 70–99)
Potassium: 3.5 mmol/L (ref 3.5–5.1)
Sodium: 135 mmol/L (ref 135–145)
Total Bilirubin: 0.4 mg/dL (ref 0.3–1.2)
Total Protein: 7.5 g/dL (ref 6.5–8.1)

## 2021-01-13 LAB — IRON AND TIBC
Iron: 71 ug/dL (ref 28–170)
Saturation Ratios: 18 % (ref 10.4–31.8)
TIBC: 395 ug/dL (ref 250–450)
UIBC: 324 ug/dL

## 2021-01-13 LAB — VITAMIN B12: Vitamin B-12: 388 pg/mL (ref 180–914)

## 2021-01-13 LAB — CBC
HCT: 34.6 % — ABNORMAL LOW (ref 36.0–46.0)
Hemoglobin: 11.2 g/dL — ABNORMAL LOW (ref 12.0–15.0)
MCH: 28.1 pg (ref 26.0–34.0)
MCHC: 32.4 g/dL (ref 30.0–36.0)
MCV: 86.7 fL (ref 80.0–100.0)
Platelets: 428 10*3/uL — ABNORMAL HIGH (ref 150–400)
RBC: 3.99 MIL/uL (ref 3.87–5.11)
RDW: 12.9 % (ref 11.5–15.5)
WBC: 11.1 10*3/uL — ABNORMAL HIGH (ref 4.0–10.5)
nRBC: 0 % (ref 0.0–0.2)

## 2021-01-16 NOTE — Progress Notes (Signed)
Hematology/Oncology Consult note [facility]  [phone] [phone]  Patient Care Team: Burnard Hawthorne, FNP as PCP - General (Family Medicine) Sindy Guadeloupe, MD as Consulting Physician (Hematology and Oncology)   Name of the patient: Cassandra Vazquez  [id number]  [birth year]   Date of visit: 01/16/21  Diagnosis-neutrophilia likely reactive  Chief complaint/ Reason for visit-routine follow-up of neutrophilia  Heme/Onc history:  Patient is a 35 year old female referred for leukocytosis. Patient's most recent CBC from 11/02/2019 showed white count of 11.2, H&H of 11.6/35 with an MCV of 85 and a platelet count of 439. Iron studies at that time were normal. CMP hepatitis C testing and hemoglobin A1c normal. Looking back at her prior CBCs patient's white cell count has been typically between 9-10. Since March 2020 it has been fluctuating around 11-12. Differential mainly shows neutrophilia with an ANC of 9.5. Her hemoglobin has been mostly stable around 11-12.     Interval history-patient currently reports feeling well overall.  She denies any specific complaints at this time.  She does have some baseline fatigue and trouble with sleeping.  ECOG PS- 0 Pain scale- 0   Review of systems- Review of Systems  Constitutional:  Positive for malaise/fatigue. Negative for chills, fever and weight loss.  HENT:  Negative for congestion, ear discharge and nosebleeds.   Eyes:  Negative for blurred vision.  Respiratory:  Negative for cough, hemoptysis, sputum production, shortness of breath and wheezing.   Cardiovascular:  Negative for chest pain, palpitations, orthopnea and claudication.  Gastrointestinal:  Negative for abdominal pain, blood in stool, constipation, diarrhea, heartburn, melena, nausea and vomiting.  Genitourinary:  Negative for dysuria, flank pain, frequency, hematuria and urgency.  Musculoskeletal:  Negative for back pain, joint pain and  myalgias.  Skin:  Negative for rash.  Neurological:  Negative for dizziness, tingling, focal weakness, seizures, weakness and headaches.  Endo/Heme/Allergies:  Does not bruise/bleed easily.  Psychiatric/Behavioral:  Negative for depression and suicidal ideas. The patient does not have insomnia.     No Known Allergies   Past Medical History:  Diagnosis Date   Anemia    Bilateral swelling of feet    Constipation    History of A2 gestational diabetes 10/22/2016   Negative postpartum 2 hr GTT   Joint pain    Vitamin D deficiency      History reviewed. No pertinent surgical history.  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   From [place]    Married   4 children- 8,8,4,2   Social Determinants of Health   Financial Resource Strain: Not on file  Food Insecurity: Not on file  Transportation Needs: Not on file  Physical Activity: Not on file  Stress: Not on file  Social Connections: Not on file  Intimate Partner Violence: Not on file  Family History  Problem Relation Age of Onset   Diabetes Mother    Obesity Mother    Diabetes Father    CVA Father    Stroke Father    Bipolar disorder Father    Obesity Father    Colon cancer Neg Hx    Breast cancer Neg Hx    Thyroid cancer Neg Hx      Current Outpatient Medications:    levonorgestrel-ethinyl estradiol (ALESSE) 0.1-20 MG-MCG tablet, Take 1 tablet by mouth daily., Disp: 90 tablet, Rfl: 3    DIALYVITE VITAMIN D3 MAX 1.25 MG (50000 UT) TABS, TAKE 1 TABLET BY MOUTH ONCE A WEEK (Patient not taking: Reported on 01/13/2021), Disp: 8 tablet, Rfl: 0  Physical exam:  Vitals:   01/13/21 1440  BP: 117/75  Pulse: 64  Resp: 20  Temp: (!) 96.9 F (36.1 C)  SpO2: 100%  Weight: 273 lb 6.4 oz (124 kg)   Physical Exam Constitutional:      General: She is not in acute distress. Cardiovascular:     Rate and Rhythm: Normal rate and regular rhythm.     Heart sounds: Normal heart sounds.  Pulmonary:     Effort: Pulmonary effort is normal.     Breath sounds: Normal breath sounds.  Abdominal:     General: Bowel sounds are normal.     Palpations: Abdomen is soft.  Skin:    General: Skin is warm and dry.  Neurological:     Mental Status: She is alert and oriented to person, place, and time.     CMP Latest Ref Rng & Units 01/13/2021  Glucose 70 - 99 mg/dL 85  BUN 6 - 20 mg/dL 10  Creatinine 0.44 - 1.00 mg/dL 0.70  Sodium 135 - 145 mmol/L 135  Potassium 3.5 - 5.1 mmol/L 3.5  Chloride 98 - 111 mmol/L 104  CO2 22 - 32 mmol/L 25  Calcium 8.9 - 10.3 mg/dL 8.5(L)  Total Protein 6.5 - 8.1 g/dL 7.5  Total Bilirubin 0.3 - 1.2 mg/dL 0.4  Alkaline Phos 38 - 126 U/L 60  AST 15 - 41 U/L 28  ALT 0 - 44 U/L 23   CBC Latest Ref Rng & Units 01/13/2021  WBC 4.0 - 10.5 K/uL 11.1(H)  Hemoglobin 12.0 - 15.0 g/dL 11.2(L)  Hematocrit 36.0 - 46.0 % 34.6(L)  Platelets 150 - 400 K/uL 428(H)     Assessment and plan- Patient is a 35 y.o. 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  Even prior to that her white count was 10.5-10.8.  Differential has mainly showed neutrophilia.  Hemoglobin again has remained stable around 11 occasional thrombocytosis with a platelet count in the low 400s.  Her B12 levels are presently normal at 388.  Iron studies are normal as well.  Recent TSH in  August 2022 was normal.  Given the stability of her counts I am inclined to monitor this conservatively without the need for a bone marrow biopsy.  Repeat CBC with differential in 6 months in 1 year and I will see her back in 1 year   Visit Diagnosis 1. Neutrophilia   2. Normocytic anemia      Dr. Randa Evens, MD, MPH [facility] [phone] 01/16/2021 8:22 AM

## 2021-03-25 ENCOUNTER — Ambulatory Visit: Payer: BC Managed Care – PPO | Admitting: Family

## 2021-03-27 ENCOUNTER — Encounter: Payer: Self-pay | Admitting: Family

## 2021-03-27 ENCOUNTER — Ambulatory Visit (INDEPENDENT_AMBULATORY_CARE_PROVIDER_SITE_OTHER): Payer: BC Managed Care – PPO | Admitting: Family

## 2021-03-27 ENCOUNTER — Other Ambulatory Visit: Payer: Self-pay

## 2021-03-27 VITALS — BP 104/62 | HR 82 | Temp 98.2°F | Ht 64.0 in | Wt 278.6 lb

## 2021-03-27 DIAGNOSIS — R5383 Other fatigue: Secondary | ICD-10-CM

## 2021-03-27 DIAGNOSIS — E538 Deficiency of other specified B group vitamins: Secondary | ICD-10-CM

## 2021-03-27 LAB — IBC + FERRITIN
Ferritin: 59.8 ng/mL (ref 10.0–291.0)
Iron: 117 ug/dL (ref 42–145)
Saturation Ratios: 26.5 % (ref 20.0–50.0)
TIBC: 441 ug/dL (ref 250.0–450.0)
Transferrin: 315 mg/dL (ref 212.0–360.0)

## 2021-03-27 LAB — VITAMIN D 25 HYDROXY (VIT D DEFICIENCY, FRACTURES): VITD: 15.36 ng/mL — ABNORMAL LOW (ref 30.00–100.00)

## 2021-03-27 NOTE — Patient Instructions (Addendum)
Start b12 1000 mcg daily  Will wait on vitamin d results  Referral to pulmonology Let us know if you dont hear back within a week in regards to an appointment being scheduled.   Nice to see you!

## 2021-03-27 NOTE — Assessment & Plan Note (Signed)
Chronic, worsened. Exacerbated by recent influenza illness and deconditioning. She is resuming exercise. Due to chronicity and non restorative sleep, pending consult with pulmonology to evaluate for OSA. Trial of B12. Close follow up to see if improvement of symptom.

## 2021-03-27 NOTE — Assessment & Plan Note (Addendum)
b12 < 400. Start b12 1000 mcg PO; she declines IM B12. Pending screen for pernicious anemia.

## 2021-03-27 NOTE — Progress Notes (Signed)
Subjective:    Patient ID: Cassandra Vazquez, female    DOB: [birth year], 35 y.o.   MRN: [id number]  CC: Cassandra Vazquez is a 35 y.o. female who presents today for follow up.   HPI: Fatigue has worsened over the past 2 weeks. Onset of worsening associated when she had influenza.  She has less interrupted sleep with 70 year old daughter.  Averages 6-8 hours sleep each night  She had the flu couple of weeks ago. She had been going to the gym 5am three mornings per week and she is trying to get back into working out after having flu.  Sleep is not restorative. She will want to nap '2 hours later' after getting up. No ha.     Consult with Dr Janese Banks 01/13/21. Monitor CBC and deferred bone marrow biopsy.   TSH 3.22 WBC 11.1 Hemoglobin 11.2 Vitamin d 16 B12 388    HISTORY:  Past Medical History:  Diagnosis Date   Anemia    Bilateral swelling of feet    Constipation    History of A2 gestational diabetes 10/22/2016   Negative postpartum 2 hr GTT   Joint pain    Vitamin D deficiency    No past surgical history on file. Family History  Problem Relation Age of Onset   Diabetes Mother    Obesity Mother    Diabetes Father    CVA Father    Stroke Father    Bipolar disorder Father    Obesity Father    Colon cancer Neg Hx    Breast cancer Neg Hx    Thyroid cancer Neg Hx     Allergies: Patient has no known allergies. Current Outpatient Medications on File Prior to Visit  Medication Sig Dispense Refill   DIALYVITE VITAMIN D3 MAX 1.25 MG (50000 UT) TABS TAKE 1 TABLET BY MOUTH ONCE A WEEK 8 tablet 0   levonorgestrel-ethinyl estradiol (ALESSE) 0.1-20 MG-MCG tablet Take 1 tablet by mouth daily. 90 tablet 3   No current facility-administered medications on file prior to visit.    Social History   Tobacco Use   Smoking status: Never   Smokeless tobacco: Never  Vaping Use   Vaping Use: Never used  Substance Use Topics   Alcohol use: Yes    Comment: wine one glass per night   Drug use:  No    Review of Systems  Constitutional:  Positive for fatigue. Negative for chills and fever.  Respiratory:  Negative for cough.   Cardiovascular:  Negative for chest pain and palpitations.  Gastrointestinal:  Negative for nausea and vomiting.  Neurological:  Negative for headaches.     Objective:    BP 104/62 (BP Location: Left Arm, Patient Position: Sitting, Cuff Size: Large)    Pulse 82    Temp 98.2 F (36.8 C) (Oral)    Ht '5\' 4"'  (1.626 m)    Wt 278 lb 9.6 oz (126.4 kg)    SpO2 98%    BMI 47.82 kg/m  BP Readings from Last 3 Encounters:  03/27/21 104/62  01/13/21 117/75  11/04/20 100/70   Wt Readings from Last 3 Encounters:  03/27/21 278 lb 9.6 oz (126.4 kg)  01/13/21 273 lb 6.4 oz (124 kg)  11/04/20 276 lb 9.6 oz (125.5 kg)    Physical Exam Vitals reviewed.  Constitutional:      Appearance: She is well-developed.  Eyes:     Conjunctiva/sclera: Conjunctivae normal.  Cardiovascular:     Rate and Rhythm: Normal rate and  regular rhythm.     Pulses: Normal pulses.     Heart sounds: Normal heart sounds.  Pulmonary:     Effort: Pulmonary effort is normal.     Breath sounds: Normal breath sounds. No wheezing, rhonchi or rales.  Skin:    General: Skin is warm and dry.  Neurological:     Mental Status: She is alert.  Psychiatric:        Speech: Speech normal.        Behavior: Behavior normal.        Thought Content: Thought content normal.       Assessment & Plan:   Problem List Items Addressed This Visit       Other   B12 deficiency    b12 < 400. Start b12 1000 mcg PO; she declines IM B12. Pending screen for pernicious anemia.       Relevant Orders   Anti-parietal antibody   Celiac Disease Ab Screen w/Rfx   Homocysteine   Intrinsic Factor Antibodies   IBC + Ferritin   Fatigue - Primary    Chronic, worsened. Exacerbated by recent influenza illness and deconditioning. She is resuming exercise. Due to chronicity and non restorative sleep, pending consult  with pulmonology to evaluate for OSA. Trial of B12. Close follow up to see if improvement of symptom.       Relevant Orders   VITAMIN D 25 Hydroxy (Vit-D Deficiency, Fractures)   Ambulatory referral to Pulmonology     I am having Cassandra Vazquez maintain her levonorgestrel-ethinyl estradiol and Dialyvite Vitamin D3 Max.   No orders of the defined types were placed in this encounter.   Return precautions given.   Risks, benefits, and alternatives of the medications and treatment plan prescribed today were discussed, and patient expressed understanding.   Education regarding symptom management and diagnosis given to patient on AVS.  Continue to follow with Burnard Hawthorne, FNP for routine health maintenance.   Cassandra Vazquez and I agreed with plan.   Mable Paris, FNP

## 2021-03-31 LAB — CELIAC DISEASE AB SCREEN W/RFX
Antigliadin Abs, IgA: 9 units (ref 0–19)
IgA/Immunoglobulin A, Serum: 284 mg/dL (ref 87–352)
Transglutaminase IgA: <2 U/mL (ref 0–3)

## 2021-04-02 LAB — INTRINSIC FACTOR ANTIBODIES: Intrinsic Factor: NEGATIVE

## 2021-04-02 LAB — ANTI-PARIETAL ANTIBODY: PARIETAL CELL AB SCREEN: NEGATIVE

## 2021-04-02 LAB — HOMOCYSTEINE: Homocysteine: 8.9 umol/L (ref <10.4)

## 2021-04-03 ENCOUNTER — Other Ambulatory Visit: Payer: Self-pay | Admitting: Family

## 2021-04-03 DIAGNOSIS — E559 Vitamin D deficiency, unspecified: Secondary | ICD-10-CM

## 2021-04-03 MED ORDER — CHOLECALCIFEROL 1.25 MG (50000 UT) PO TABS: ORAL_TABLET | ORAL | 0 refills | Status: DC

## 2021-05-25 ENCOUNTER — Encounter: Payer: BC Managed Care – PPO | Admitting: Family

## 2021-05-25 NOTE — Progress Notes (Signed)
° °  Subjective:    Patient ID: Cassandra Vazquez, female    DOB: Mar 20, 1986, 36 y.o.   MRN: [id number]  CC: Cassandra Vazquez is a 36 y.o. female who presents today for follow up.   HPI: HPI  HISTORY:  Past Medical History:  Diagnosis Date   Anemia    Bilateral swelling of feet    Constipation    History of A2 gestational diabetes 10/22/2016   Negative postpartum 2 hr GTT   Joint pain    Vitamin D deficiency    No past surgical history on file. Family History  Problem Relation Age of Onset   Diabetes Mother    Obesity Mother    Diabetes Father    CVA Father    Stroke Father    Bipolar disorder Father    Obesity Father    Colon cancer Neg Hx    Breast cancer Neg Hx    Thyroid cancer Neg Hx     Allergies: Patient has no known allergies. Current Outpatient Medications on File Prior to Visit  Medication Sig Dispense Refill   Cholecalciferol 1.25 MG (50000 UT) TABS 50,000 units PO qwk for 8 weeks. 8 tablet 0   levonorgestrel-ethinyl estradiol (ALESSE) 0.1-20 MG-MCG tablet Take 1 tablet by mouth daily. 90 tablet 3   No current facility-administered medications on file prior to visit.    Social History   Tobacco Use   Smoking status: Never   Smokeless tobacco: Never  Vaping Use   Vaping Use: Never used  Substance Use Topics   Alcohol use: Yes    Comment: wine one glass per night   Drug use: No    Review of Systems    Objective:    There were no vitals taken for this visit. BP Readings from Last 3 Encounters:  03/27/21 104/62  01/13/21 117/75  11/04/20 100/70   Wt Readings from Last 3 Encounters:  03/27/21 278 lb 9.6 oz (126.4 kg)  01/13/21 273 lb 6.4 oz (124 kg)  11/04/20 276 lb 9.6 oz (125.5 kg)    Physical Exam     Assessment & Plan:   Problem List Items Addressed This Visit   None Visit Diagnoses     Erroneous encounter - disregard    -  Primary        I am having Cassandra Vazquez maintain her levonorgestrel-ethinyl estradiol and  Cholecalciferol.   No orders of the defined types were placed in this encounter.   Return precautions given.   Risks, benefits, and alternatives of the medications and treatment plan prescribed today were discussed, and patient expressed understanding.   Education regarding symptom management and diagnosis given to patient on AVS.  Continue to follow with Allegra Grana, FNP for routine health maintenance.   Cassandra Vazquez and I agreed with plan.   Rennie Plowman, FNP  This encounter was created in error - please disregard.

## 2021-05-26 ENCOUNTER — Institutional Professional Consult (permissible substitution): Payer: BC Managed Care – PPO | Admitting: Primary Care

## 2021-07-14 ENCOUNTER — Inpatient Hospital Stay: Payer: BC Managed Care – PPO | Attending: Oncology

## 2021-11-04 ENCOUNTER — Encounter (INDEPENDENT_AMBULATORY_CARE_PROVIDER_SITE_OTHER): Payer: Self-pay

## 2022-01-13 ENCOUNTER — Inpatient Hospital Stay: Payer: BC Managed Care – PPO | Attending: Oncology

## 2022-01-13 ENCOUNTER — Inpatient Hospital Stay: Payer: BC Managed Care – PPO | Admitting: Oncology

## 2022-01-13 ENCOUNTER — Encounter: Payer: Self-pay | Admitting: Oncology

## 2022-05-05 ENCOUNTER — Encounter: Payer: BC Managed Care – PPO | Admitting: Family

## 2022-05-07 ENCOUNTER — Encounter: Payer: Self-pay | Admitting: Family

## 2022-05-07 ENCOUNTER — Ambulatory Visit (INDEPENDENT_AMBULATORY_CARE_PROVIDER_SITE_OTHER): Payer: BC Managed Care – PPO | Admitting: Family

## 2022-05-07 ENCOUNTER — Other Ambulatory Visit (HOSPITAL_COMMUNITY)
Admission: RE | Admit: 2022-05-07 | Discharge: 2022-05-07 | Disposition: A | Discharge status: Home / Self Care | Payer: BC Managed Care – PPO | Source: Ambulatory Visit | Attending: Family | Admitting: Family

## 2022-05-07 VITALS — BP 118/70 | HR 74 | Temp 98.4°F | Ht 64.0 in | Wt 279.2 lb

## 2022-05-07 DIAGNOSIS — Z0000 Encounter for general adult medical examination without abnormal findings: Secondary | ICD-10-CM

## 2022-05-07 DIAGNOSIS — Z6841 Body Mass Index (BMI) 40.0 and over, adult: Secondary | ICD-10-CM

## 2022-05-07 DIAGNOSIS — Z124 Encounter for screening for malignant neoplasm of cervix: Secondary | ICD-10-CM

## 2022-05-07 DIAGNOSIS — Z202 Contact with and (suspected) exposure to infections with a predominantly sexual mode of transmission: Secondary | ICD-10-CM

## 2022-05-07 LAB — CBC WITH DIFFERENTIAL/PLATELET
Basophils Absolute: 0 10*3/uL (ref 0.0–0.1)
Basophils Relative: 0.2 % (ref 0.0–3.0)
Eosinophils Absolute: 0.1 10*3/uL (ref 0.0–0.7)
Eosinophils Relative: 1 % (ref 0.0–5.0)
HCT: 36.6 % (ref 36.0–46.0)
Hemoglobin: 11.6 g/dL — ABNORMAL LOW (ref 12.0–15.0)
Lymphocytes Relative: 21.2 % (ref 12.0–46.0)
Lymphs Abs: 2.4 10*3/uL (ref 0.7–4.0)
MCHC: 31.8 g/dL (ref 30.0–36.0)
MCV: 83.5 fl (ref 78.0–100.0)
Monocytes Absolute: 0.7 10*3/uL (ref 0.1–1.0)
Monocytes Relative: 6.4 % (ref 3.0–12.0)
Neutro Abs: 8.2 10*3/uL — ABNORMAL HIGH (ref 1.4–7.7)
Neutrophils Relative %: 71.2 % (ref 43.0–77.0)
Platelets: 462 10*3/uL — ABNORMAL HIGH (ref 150.0–400.0)
RBC: 4.38 Mil/uL (ref 3.87–5.11)
RDW: 14.7 % (ref 11.5–15.5)
WBC: 11.5 10*3/uL — ABNORMAL HIGH (ref 4.0–10.5)

## 2022-05-07 LAB — COMPREHENSIVE METABOLIC PANEL
ALT: 16 U/L (ref 0–35)
AST: 14 U/L (ref 0–37)
Albumin: 4.2 g/dL (ref 3.5–5.2)
Alkaline Phosphatase: 59 U/L (ref 39–117)
BUN: 7 mg/dL (ref 6–23)
CO2: 26 mEq/L (ref 19–32)
Calcium: 9.2 mg/dL (ref 8.4–10.5)
Chloride: 103 mEq/L (ref 96–112)
Creatinine, Ser: 0.57 mg/dL (ref 0.40–1.20)
GFR: 118.17 mL/min (ref >60.00)
Glucose, Bld: 84 mg/dL (ref 70–99)
Potassium: 3.8 mEq/L (ref 3.5–5.1)
Sodium: 137 mEq/L (ref 135–145)
Total Bilirubin: 0.4 mg/dL (ref 0.2–1.2)
Total Protein: 7.3 g/dL (ref 6.0–8.3)

## 2022-05-07 LAB — VITAMIN D 25 HYDROXY (VIT D DEFICIENCY, FRACTURES): VITD: 8.56 ng/mL — ABNORMAL LOW (ref 30.00–100.00)

## 2022-05-07 LAB — LIPID PANEL
Cholesterol: 226 mg/dL — ABNORMAL HIGH (ref 0–200)
HDL: 53 mg/dL (ref >39.00)
LDL Cholesterol: 144 mg/dL — ABNORMAL HIGH (ref 0–99)
NonHDL: 173.28
Total CHOL/HDL Ratio: 4
Triglycerides: 147 mg/dL (ref 0.0–149.0)
VLDL: 29.4 mg/dL (ref 0.0–40.0)

## 2022-05-07 LAB — B12 AND FOLATE PANEL
Folate: 11.1 ng/mL (ref >5.9)
Vitamin B-12: 379 pg/mL (ref 211–911)

## 2022-05-07 LAB — TSH: TSH: 1.6 u[IU]/mL (ref 0.35–5.50)

## 2022-05-07 MED ORDER — METFORMIN HCL ER 500 MG PO TB24: 1000.0000 mg | ORAL_TABLET | Freq: Two times a day (BID) | ORAL | 3 refills | Status: DC

## 2022-05-07 NOTE — Patient Instructions (Addendum)
Metformin is used in prediabetes, diabetes, and also for weight loss by decreasing calorie consumption.   It works in a couple of ways by decreasing liver glucose production, decreases intestinal absorption of glucose and improves insulin sensitivity (increases peripheral glucose uptake and utilization).     Please make sure that you titrate per below so not to cause any GI upset.  The instructions on the metformin bottle however say metformin 1025m twice daily so that you do not run out of medication when you are on maximum dose but you will need to titrate to get there.    Lets trial metformin  Start metformin XR with one 5067mtablet at night. After one week, you may increase to two tablets at night ( total of 100065m. The third week, you may take take two tablets at night and one tablet in the morning.  The fourth week, you may take two tablets in the morning ( 1000m17mtal) and two tablets at night (1000mg27mal). This will bring you to a maximum daily dose of 2000mg/11mwhich is maximum dose.  So you are aware,  you may take ALL 4 tablets of metformin together at the same time if preferable and doesn't cause GI upset. You may take metformin 2000mg (79mr of the 500mg ta75ms) together in the morning or at night if you prefer.   Along the way, if you want to increase more slowly, please do as this medication can cause GI discomfort and loose stools which usually get better with time , however some patients find that they can only tolerate a certain dose and cannot increase to maximum dose.   Health Maintenance, Female Adopting a healthy lifestyle and getting preventive care are important in promoting health and wellness. Ask your health care provider about: The right schedule for you to have regular tests and exams. Things you can do on your own to prevent diseases and keep yourself healthy. What should I know about diet, weight, and exercise? Eat a healthy diet  Eat a diet that includes  plenty of vegetables, fruits, low-fat dairy products, and lean protein. Do not eat a lot of foods that are high in solid fats, added sugars, or sodium. Maintain a healthy weight Body mass index (BMI) is used to identify weight problems. It estimates body fat based on height and weight. Your health care provider can help determine your BMI and help you achieve or maintain a healthy weight. Get regular exercise Get regular exercise. This is one of the most important things you can do for your health. Most adults should: Exercise for at least 150 minutes each week. The exercise should increase your heart rate and make you sweat (moderate-intensity exercise). Do strengthening exercises at least twice a week. This is in addition to the moderate-intensity exercise. Spend less time sitting. Even light physical activity can be beneficial. Watch cholesterol and blood lipids Have your blood tested for lipids and cholesterol at 20 years48f age, then have this test every 5 years. Have your cholesterol levels checked more often if: Your lipid or cholesterol levels are high. You are older than 40 years49f age. You are at high risk for heart disease. What should I know about cancer screening? Depending on your health history and family history, you may need to have cancer screening at various ages. This may include screening for: Breast cancer. Cervical cancer. Colorectal cancer. Skin cancer. Lung cancer. What should I know about heart disease, diabetes, and high blood pressure?  Blood pressure and heart disease High blood pressure causes heart disease and increases the risk of stroke. This is more likely to develop in people who have high blood pressure readings or are overweight. Have your blood pressure checked: Every 3-5 years if you are 44-67 years of age. Every year if you are 16 years old or older. Diabetes Have regular diabetes screenings. This checks your fasting blood sugar level. Have the  screening done: Once every three years after age 59 if you are at a normal weight and have a low risk for diabetes. More often and at a younger age if you are overweight or have a high risk for diabetes. What should I know about preventing infection? Hepatitis B If you have a higher risk for hepatitis B, you should be screened for this virus. Talk with your health care provider to find out if you are at risk for hepatitis B infection. Hepatitis C Testing is recommended for: Everyone born from [birth year] through 1963. Anyone with known risk factors for hepatitis C. Sexually transmitted infections (STIs) Get screened for STIs, including gonorrhea and chlamydia, if: You are sexually active and are younger than 35 years of age. You are older than 35 years of age and your health care provider tells you that you are at risk for this type of infection. Your sexual activity has changed since you were last screened, and you are at increased risk for chlamydia or gonorrhea. Ask your health care provider if you are at risk. Ask your health care provider about whether you are at high risk for HIV. Your health care provider may recommend a prescription medicine to help prevent HIV infection. If you choose to take medicine to prevent HIV, you should first get tested for HIV. You should then be tested every 3 months for as long as you are taking the medicine. Pregnancy If you are about to stop having your period (premenopausal) and you may become pregnant, seek counseling before you get pregnant. Take 400 to 800 micrograms (mcg) of folic acid every day if you become pregnant. Ask for birth control (contraception) if you want to prevent pregnancy. Osteoporosis and menopause Osteoporosis is a disease in which the bones lose minerals and strength with aging. This can result in bone fractures. If you are 25 years old or older, or if you are at risk for osteoporosis and fractures, ask your health care provider if you  should: Be screened for bone loss. Take a calcium or vitamin D supplement to lower your risk of fractures. Be given hormone replacement therapy (HRT) to treat symptoms of menopause. Follow these instructions at home: Alcohol use Do not drink alcohol if: Your health care provider tells you not to drink. You are pregnant, may be pregnant, or are planning to become pregnant. If you drink alcohol: Limit how much you have to: 0-1 drink a day. Know how much alcohol is in your drink. In the [place] one drink equals one 12 oz bottle of beer (355 mL), one 5 oz glass of wine (148 mL), or one 1 oz glass of hard liquor (44 mL). Lifestyle Do not use any products that contain nicotine or tobacco. These products include cigarettes, chewing tobacco, and vaping devices, such as e-cigarettes. If you need help quitting, ask your health care provider. Do not use street drugs. Do not share needles. Ask your health care provider for help if you need support or information about quitting drugs. General instructions Schedule regular health, dental, and eye exams.  Stay current with your vaccines. Tell your health care provider if: You often feel depressed. You have ever been abused or do not feel safe at home. Summary Adopting a healthy lifestyle and getting preventive care are important in promoting health and wellness. Follow your health care provider's instructions about healthy diet, exercising, and getting tested or screened for diseases. Follow your health care provider's instructions on monitoring your cholesterol and blood pressure. This information is not intended to replace advice given to you by your health care provider. Make sure you discuss any questions you have with your health care provider. Document Revised: 08/04/2020 Document Reviewed: 08/04/2020 Elsevier Patient Education  [place].

## 2022-05-07 NOTE — Assessment & Plan Note (Signed)
Clinical breast exam and Pap smear performed today.Counseling on limiting carbohydrates and creating caloric deficit.

## 2022-05-07 NOTE — Assessment & Plan Note (Signed)
Trial of metformin. Patient would prefer to avoid injectable such as Wegovy. Counseled on side effects and titration of metformin.

## 2022-05-07 NOTE — Progress Notes (Signed)
Assessment & Plan:  Routine physical examination Assessment & Plan: Clinical breast exam and Pap smear performed today.Counseling on limiting carbohydrates and creating caloric deficit.   Orders: -     CBC with Differential/Platelet; Future -     Comprehensive metabolic panel; Future -     Lipid panel; Future -     TSH; Future -     VITAMIN D 25 Hydroxy (Vit-D Deficiency, Fractures); Future -     Iron, TIBC and Ferritin Panel; Future -     RPR; Future -     HIV Antibody (routine testing w rflx); Future  Pap smear for cervical cancer screening -     Cytology - PAP  STD exposure -     B12 and Folate Panel; Future -     RPR; Future -     HIV Antibody (routine testing w rflx); Future -     Acute Hep Panel & Hep B Surface Ab; Future  BMI 45.0-49.9, adult [facility]) Assessment & Plan: Trial of metformin. Patient would prefer to avoid injectable such as Wegovy. Counseled on side effects and titration of metformin.   Orders: -     metFORMIN HCl ER; Take 2 tablets (1,000 mg total) by mouth 2 (two) times daily.  Dispense: 360 tablet; Refill: 3     Return precautions given.   Risks, benefits, and alternatives of the medications and treatment plan prescribed today were discussed, and patient expressed understanding.   Education regarding symptom management and diagnosis given to patient on AVS either electronically or printed.  Return in about 4 months (around 09/05/2022).  Mable Paris, FNP  Subjective:    Patient ID: Cassandra Vazquez, female    DOB: 1986/01/20, 37 y.o.   MRN: [id number]  CC: Cassandra Vazquez is a 37 y.o. female who presents today for physical exam.    HPI: Remains frustrated by weight. Endorses indiscretion with carbohydrates.     She has been on saxenda in the past.     No early family history of colon or breast cancer Cervical Cancer Screening: done 11/02/19, negative HPV, NIL.  Requests STD testing today.          Tetanus - UTD         Labs: Screening labs  today. Exercise: No regular exercise.   Alcohol use:  occasional beer Smoking/tobacco use: Nonsmoker.    Health Maintenance  Topic Date Due   COVID-19 Vaccine (3 - Moderna risk series) 08/06/2019   INFLUENZA VACCINE  06/27/2022 (Originally 10/27/2021)   PAP SMEAR-Modifier  11/02/2022   DTaP/Tdap/Td (2 - Td or Tdap) 11/05/2026   Hepatitis C Screening  Completed   HIV Screening  Completed   HPV VACCINES  Aged Out    ALLERGIES: Patient has no known allergies.  Current Outpatient Medications on File Prior to Visit  Medication Sig Dispense Refill   Cholecalciferol 1.25 MG (50000 UT) TABS 50,000 units PO qwk for 8 weeks. 8 tablet 0   levonorgestrel-ethinyl estradiol (ALESSE) 0.1-20 MG-MCG tablet Take 1 tablet by mouth daily. 90 tablet 3   No current facility-administered medications on file prior to visit.    Review of Systems  Constitutional:  Negative for chills, fever and unexpected weight change.  HENT:  Negative for congestion.   Respiratory:  Negative for cough.   Cardiovascular:  Negative for chest pain, palpitations and leg swelling.  Gastrointestinal:  Negative for nausea and vomiting.  Musculoskeletal:  Negative for arthralgias and myalgias.  Skin:  Negative for rash.  Neurological:  Negative for headaches.  Hematological:  Negative for adenopathy.  Psychiatric/Behavioral:  Negative for confusion.       Objective:    BP 118/70   Pulse 74   Temp 98.4 F (36.9 C) (Oral)   Ht 5' 4"$  (1.626 m)   Wt 279 lb 3.2 oz (126.6 kg)   LMP  (LMP Unknown)   SpO2 98%   BMI 47.92 kg/m   BP Readings from Last 3 Encounters:  05/07/22 118/70  03/27/21 104/62  01/13/21 117/75   Wt Readings from Last 3 Encounters:  05/07/22 279 lb 3.2 oz (126.6 kg)  03/27/21 278 lb 9.6 oz (126.4 kg)  01/13/21 273 lb 6.4 oz (124 kg)    Physical Exam Vitals reviewed.  Constitutional:      Appearance: She is well-developed.  Eyes:     Conjunctiva/sclera: Conjunctivae normal.  Neck:      Thyroid: No thyroid mass or thyromegaly.  Cardiovascular:     Rate and Rhythm: Normal rate and regular rhythm.     Pulses: Normal pulses.     Heart sounds: Normal heart sounds.  Pulmonary:     Effort: Pulmonary effort is normal.     Breath sounds: Normal breath sounds. No wheezing, rhonchi or rales.  Chest:  Breasts:    Breasts are symmetrical.     Right: No inverted nipple, mass, nipple discharge, skin change or tenderness.     Left: No inverted nipple, mass, nipple discharge, skin change or tenderness.  Genitourinary:    Cervix: No cervical motion tenderness, discharge or friability.     Uterus: Not enlarged, not fixed and not tender.      Adnexa:        Right: No mass, tenderness or fullness.         Left: No mass, tenderness or fullness.       Comments: Pap performed. No CMT. Unable to appreciated ovaries. Lymphadenopathy:     Head:     Right side of head: No submental, submandibular, tonsillar, preauricular, posterior auricular or occipital adenopathy.     Left side of head: No submental, submandibular, tonsillar, preauricular, posterior auricular or occipital adenopathy.     Cervical: No cervical adenopathy.     Right cervical: No superficial, deep or posterior cervical adenopathy.    Left cervical: No superficial, deep or posterior cervical adenopathy.     Upper Body:     Right upper body: No pectoral adenopathy.     Left upper body: No pectoral adenopathy.  Skin:    General: Skin is warm and dry.  Neurological:     Mental Status: She is alert.  Psychiatric:        Speech: Speech normal.        Behavior: Behavior normal.        Thought Content: Thought content normal.

## 2022-05-08 LAB — IRON,TIBC AND FERRITIN PANEL
%SAT: 18 % (calc) (ref 16–45)
Ferritin: 27 ng/mL (ref 16–154)
Iron: 68 ug/dL (ref 40–190)
TIBC: 379 mcg/dL (calc) (ref 250–450)

## 2022-05-10 LAB — REFLEX TIQ

## 2022-05-10 LAB — HIV ANTIBODY (ROUTINE TESTING W REFLEX): HIV 1&2 Ab, 4th Generation: NONREACTIVE

## 2022-05-10 LAB — ACUTE HEP PANEL AND HEP B SURFACE AB
HEPATITIS C ANTIBODY REFILL$(REFL): NONREACTIVE
Hep A IgM: NONREACTIVE
Hep B C IgM: NONREACTIVE
Hepatitis B Surface Ag: NONREACTIVE

## 2022-05-10 LAB — RPR: RPR Ser Ql: NONREACTIVE

## 2022-05-11 LAB — CYTOLOGY - PAP
Chlamydia: NEGATIVE
Comment: NEGATIVE
Comment: NEGATIVE
Comment: NEGATIVE
Comment: NORMAL
Diagnosis: NEGATIVE
High risk HPV: NEGATIVE
Neisseria Gonorrhea: NEGATIVE
Trichomonas: NEGATIVE

## 2022-05-12 ENCOUNTER — Other Ambulatory Visit: Payer: Self-pay | Admitting: Family

## 2022-05-12 DIAGNOSIS — Z8639 Personal history of other endocrine, nutritional and metabolic disease: Secondary | ICD-10-CM

## 2022-05-12 MED ORDER — CHOLECALCIFEROL 1.25 MG (50000 UT) PO TABS: ORAL_TABLET | ORAL | 0 refills | Status: DC

## 2022-05-19 ENCOUNTER — Telehealth: Payer: Self-pay

## 2022-05-19 NOTE — Telephone Encounter (Signed)
Cassandra Vazquez, Your cholesterol and LDL cholesterol has increased.  However overall cardiovascular risk is low.  Will continue to monitor    LM FOR PT TO CB

## 2022-05-25 ENCOUNTER — Telehealth: Payer: Self-pay

## 2022-05-25 NOTE — Telephone Encounter (Signed)
LVM TO CALL BACK TO GO OVER RESULTS

## 2022-06-01 NOTE — Telephone Encounter (Signed)
Pt returned Jenate call about lab results.

## 2022-06-04 NOTE — Telephone Encounter (Signed)
LVM to call back to review results.

## 2022-06-08 NOTE — Telephone Encounter (Signed)
3rd attempt letter mailed to pt

## 2022-07-19 ENCOUNTER — Encounter: Payer: Self-pay | Admitting: Family

## 2022-07-20 NOTE — Telephone Encounter (Signed)
LVM to call back to office to schedule VV or in person to discuss birth control

## 2022-07-26 ENCOUNTER — Telehealth: Payer: BC Managed Care – PPO | Admitting: Family

## 2022-07-26 ENCOUNTER — Encounter: Payer: Self-pay | Admitting: Family

## 2022-07-26 VITALS — Ht 64.0 in | Wt 183.2 lb

## 2022-07-26 DIAGNOSIS — Z3009 Encounter for other general counseling and advice on contraception: Secondary | ICD-10-CM

## 2022-07-26 DIAGNOSIS — Z6841 Body Mass Index (BMI) 40.0 and over, adult: Secondary | ICD-10-CM

## 2022-07-26 DIAGNOSIS — Z8639 Personal history of other endocrine, nutritional and metabolic disease: Secondary | ICD-10-CM

## 2022-07-26 MED ORDER — LEVONORGESTREL-ETHINYL ESTRAD 0.1-20 MG-MCG PO TABS: 1.0000 | ORAL_TABLET | Freq: Every day | ORAL | 4 refills | Status: DC

## 2022-07-26 NOTE — Assessment & Plan Note (Signed)
No contraindication to starting birth control at this time. Start Alesse.

## 2022-07-26 NOTE — Patient Instructions (Signed)
I have sent in birth control to your pharmacy Please call our office to schedule a lab for vitamin D at your convenience.

## 2022-07-26 NOTE — Progress Notes (Signed)
Virtual Visit via Video Note  I connected with Cassandra Vazquez on 07/26/22 at  4:00 PM EDT by a video enabled telemedicine application and verified that I am speaking with the correct person using two identifiers. Location patient: home Location provider: work  Persons participating in the virtual visit: patient, provider  I discussed the limitations of evaluation and management by telemedicine and the availability of in person appointments. The patient expressed understanding and agreed to proceed.  HPI: She is interested in OCP to prevent pregnancy She has heavier menses of late and would like OCP for this reason as well.  No large clots. Mild cramping prior to menses.   LMP: 06/25/22   No history of DVT, migraine with aura. No history of cancer. Patient states she's not pregnant or breast-feeding.No concern for STDs.   She has been on birth control in the past.   Never smoker  ROS: See pertinent positives and negatives per HPI.  EXAM:  VITALS per patient if applicable: Ht 5\' 4"  (1.626 m)   Wt 183 lb 3.2 oz (83.1 kg)   LMP 06/25/2022   SpO2 (!) 5%   BMI 31.45 kg/m  BP Readings from Last 3 Encounters:  05/07/22 118/70  03/27/21 104/62  01/13/21 117/75   Wt Readings from Last 3 Encounters:  07/26/22 183 lb 3.2 oz (83.1 kg)  05/07/22 279 lb 3.2 oz (126.6 kg)  03/27/21 278 lb 9.6 oz (126.4 kg)    GENERAL: alert, oriented, appears well and in no acute distress  HEENT: atraumatic, conjunttiva clear, no obvious abnormalities on inspection of external nose and ears  NECK: normal movements of the head and neck  LUNGS: on inspection no signs of respiratory distress, breathing rate appears normal, no obvious gross SOB, gasping or wheezing  CV: no obvious cyanosis  MS: moves all visible extremities without noticeable abnormality  PSYCH/NEURO: pleasant and cooperative, no obvious depression or anxiety, speech and thought processing grossly intact  ASSESSMENT AND  PLAN: Birth control counseling Assessment & Plan: No contraindication to starting birth control at this time. Start Alesse.   Orders: -     Levonorgestrel-Ethinyl Estrad; Take 1 tablet by mouth daily.  Dispense: 90 tablet; Refill: 4  History of vitamin D deficiency -     VITAMIN D 25 Hydroxy (Vit-D Deficiency, Fractures); Future  BMI 45.0-49.9, adult (HCC)     -we discussed possible serious and likely etiologies, options for evaluation and workup, limitations of telemedicine visit vs in person visit, treatment, treatment risks and precautions. Pt prefers to treat via telemedicine empirically rather then risking or undertaking an in person visit at this moment.    I discussed the assessment and treatment plan with the patient. The patient was provided an opportunity to ask questions and all were answered. The patient agreed with the plan and demonstrated an understanding of the instructions.   The patient was advised to call back or seek an in-person evaluation if the symptoms worsen or if the condition fails to improve as anticipated.  Advised if desired AVS can be mailed or viewed via MyChart if Mychart user.   Rennie Plowman, FNP

## 2022-09-06 ENCOUNTER — Ambulatory Visit: Payer: BC Managed Care – PPO | Admitting: Family

## 2022-09-21 ENCOUNTER — Encounter: Payer: Self-pay | Admitting: Family

## 2022-09-21 ENCOUNTER — Ambulatory Visit: Payer: BC Managed Care – PPO | Admitting: Family

## 2022-09-21 ENCOUNTER — Telehealth: Payer: Self-pay | Admitting: Family

## 2022-09-21 VITALS — BP 122/82 | HR 92 | Temp 98.5°F | Ht 64.0 in | Wt 283.0 lb

## 2022-09-21 DIAGNOSIS — Z131 Encounter for screening for diabetes mellitus: Secondary | ICD-10-CM | POA: Diagnosis not present

## 2022-09-21 DIAGNOSIS — R5383 Other fatigue: Secondary | ICD-10-CM

## 2022-09-21 DIAGNOSIS — E559 Vitamin D deficiency, unspecified: Secondary | ICD-10-CM

## 2022-09-21 DIAGNOSIS — D649 Anemia, unspecified: Secondary | ICD-10-CM

## 2022-09-21 DIAGNOSIS — Z6841 Body Mass Index (BMI) 40.0 and over, adult: Secondary | ICD-10-CM

## 2022-09-21 DIAGNOSIS — Z8639 Personal history of other endocrine, nutritional and metabolic disease: Secondary | ICD-10-CM

## 2022-09-21 LAB — CBC WITH DIFFERENTIAL/PLATELET
Basophils Absolute: 0 10*3/uL (ref 0.0–0.1)
Basophils Relative: 0.2 % (ref 0.0–3.0)
Eosinophils Absolute: 0.1 10*3/uL (ref 0.0–0.7)
Eosinophils Relative: 1.1 % (ref 0.0–5.0)
HCT: 36.6 % (ref 36.0–46.0)
Hemoglobin: 11.7 g/dL — ABNORMAL LOW (ref 12.0–15.0)
Lymphocytes Relative: 24.5 % (ref 12.0–46.0)
Lymphs Abs: 2.8 10*3/uL (ref 0.7–4.0)
MCHC: 32.1 g/dL (ref 30.0–36.0)
MCV: 84.8 fl (ref 78.0–100.0)
Monocytes Absolute: 0.6 10*3/uL (ref 0.1–1.0)
Monocytes Relative: 5.1 % (ref 3.0–12.0)
Neutro Abs: 7.9 10*3/uL — ABNORMAL HIGH (ref 1.4–7.7)
Neutrophils Relative %: 69.1 % (ref 43.0–77.0)
Platelets: 466 10*3/uL — ABNORMAL HIGH (ref 150.0–400.0)
RBC: 4.31 Mil/uL (ref 3.87–5.11)
RDW: 14.4 % (ref 11.5–15.5)
WBC: 11.5 10*3/uL — ABNORMAL HIGH (ref 4.0–10.5)

## 2022-09-21 LAB — VITAMIN D 25 HYDROXY (VIT D DEFICIENCY, FRACTURES): VITD: 26.97 ng/mL — ABNORMAL LOW (ref 30.00–100.00)

## 2022-09-21 LAB — HEMOGLOBIN A1C: Hgb A1c MFr Bld: 5.4 % (ref 4.6–6.5)

## 2022-09-21 MED ORDER — WEGOVY 0.25 MG/0.5ML ~~LOC~~ SOAJ: 0.2500 mg | SUBCUTANEOUS | 2 refills | Status: DC

## 2022-09-21 NOTE — Telephone Encounter (Signed)
Pt called stating the medication-wegovy did not go through with her insurance and need a PA

## 2022-09-21 NOTE — Progress Notes (Signed)
Assessment & Plan:  BMI 45.0-49.9, adult [facility]) Assessment & Plan: Intolerance to metformin.  Strong family history of diabetes.  Counseled on blackbox warning, titration and side effects of Wegovy.  Start [employer].  She will let me know how she is doing.   Orders: -     Wegovy; Inject 0.25 mg into the skin once a week.  Dispense: 2 mL; Refill: 2 -     Hemoglobin A1c  Fatigue, unspecified type -     CBC with Differential/Platelet -     Iron, TIBC and Ferritin Panel  History of vitamin D deficiency -     VITAMIN D 25 Hydroxy (Vit-D Deficiency, Fractures)  Anemia, unspecified type -     Urinalysis, Routine w reflex microscopic; Future  Class 3 severe obesity without serious comorbidity with body mass index (BMI) of 45.0 to 49.9 in adult, unspecified obesity type [facility]) Assessment & Plan: Intolerance to metformin.  Strong family history of diabetes.  Counseled on blackbox warning, titration and side effects of Wegovy.  Start [employer].  She will let me know how she is doing.       Return precautions given.   Risks, benefits, and alternatives of the medications and treatment plan prescribed today were discussed, and patient expressed understanding.   Education regarding symptom management and diagnosis given to patient on AVS either electronically or printed.  Return in about 6 months (around 03/23/2023).  Rennie Plowman, FNP  Subjective:    Patient ID: Cassandra Vazquez, female    DOB: September 06, 1985, 37 y.o.   MRN: [id number]  CC: Cassandra Vazquez is a 37 y.o. female who presents today for follow up.   HPI: She was unable to tolerate metformin due to increased gas and bloating. She remains frustrated by weight.  Previously on Saxenda and tolerated quite well.  Interested in [place].  No personal or family history of thyroid cancer, multiple endocrine neoplasia.  Menses are more regular on birth control.  Bleeding on menses is less in quantity.  She is not taking an iron Supplement.    Allergies: Patient has no known allergies. Current Outpatient Medications on File Prior to Visit  Medication Sig Dispense Refill   Cholecalciferol 1.25 MG (50000 UT) TABS 50,000 units PO qwk for 8 weeks. 8 tablet 0   levonorgestrel-ethinyl estradiol (ALESSE) 0.1-20 MG-MCG tablet Take 1 tablet by mouth daily. 90 tablet 4   No current facility-administered medications on file prior to visit.    Review of Systems  Constitutional:  Negative for chills and fever.  Respiratory:  Negative for cough.   Cardiovascular:  Negative for chest pain and palpitations.  Gastrointestinal:  Negative for nausea and vomiting.      Objective:    BP 122/82   Pulse 92   Temp 98.5 F (36.9 C) (Oral)   Ht 5\' 4"  (1.626 m)   Wt 283 lb (128.4 kg)   SpO2 99%   BMI 48.58 kg/m  BP Readings from Last 3 Encounters:  09/21/22 122/82  05/07/22 118/70  03/27/21 104/62   Wt Readings from Last 3 Encounters:  09/21/22 283 lb (128.4 kg)  07/26/22 183 lb 3.2 oz (83.1 kg)  05/07/22 279 lb 3.2 oz (126.6 kg)    Physical Exam Vitals reviewed.  Constitutional:      Appearance: She is well-developed.  Eyes:     Conjunctiva/sclera: Conjunctivae normal.  Cardiovascular:     Rate and Rhythm: Normal rate and regular rhythm.     Pulses: Normal pulses.  Heart sounds: Normal heart sounds.  Pulmonary:     Effort: Pulmonary effort is normal.     Breath sounds: Normal breath sounds. No wheezing, rhonchi or rales.  Skin:    General: Skin is warm and dry.  Neurological:     Mental Status: She is alert.  Psychiatric:        Speech: Speech normal.        Behavior: Behavior normal.        Thought Content: Thought content normal.

## 2022-09-21 NOTE — Assessment & Plan Note (Signed)
Intolerance to metformin.  Strong family history of diabetes.  Counseled on blackbox warning, titration and side effects of Wegovy.  Start [employer].  She will let me know how she is doing.

## 2022-09-21 NOTE — Patient Instructions (Signed)
We have discussed starting non insulin daily injectable medication called Wegovy  which is a glucagon like peptide (GLP 1) agonist and works by delaying gastric emptying and increasing insulin secretion.It is given once per week. Most patients see significant weight loss with this drug class.   You may NOT take either medication if you or your family has history of thyroid, parathyroid, OR adrenal cancer. Please confirm you and your family does NOT have this history as this drug class has black box warning on this medication for that reason.   Please follow  directions on prescription and slowly increase from 0.25mg [place] once per week ;stay here for 4 weeks. You may then increase to 0.5mg [place] once per week and stay there for 4 weeks.  We can slowly titrate further at follow up with goal of no more than 1-2 lbs weight loss per week.  Semaglutide (Wegovy)  Dose (mg) Once Weekly Titration:    If a dose is not tolerated, consider delaying further dose increases for  another 4 weeks.  If you are actively losing weight on a dose, do not increase medication.   Weeks 1 through 4  0.25 mg once weekly  Weeks 5 through 8  0.5 mg once weekly  Weeks 9 though 12  1 mg once weekly  Weeks 13 through 16  1.7 mg once weekly   Semaglutide Injection (Weight Management) What is this medication? SEMAGLUTIDE (SEM a GLOO tide) promotes weight loss. It may also be used to maintain weight loss. It works by decreasing appetite. Changes to diet and exercise are often combined with this medication. This medicine may be used for other purposes; ask your health care provider or pharmacist if you have questions. COMMON BRAND NAME(S): Wegovy What should I tell my care team before I take this medication? They need to know if you have any of these conditions: Endocrine tumors (MEN 2) or if someone in your family had these tumors Eye disease, vision problems Gallbladder disease History of depression or mental health  disease History of pancreatitis Kidney disease Stomach or intestine problems Suicidal thoughts, plans, or attempt; a previous suicide attempt by you or a family member Thyroid cancer or if someone in your family had thyroid cancer An unusual or allergic reaction to semaglutide, other medications, foods, dyes, or preservatives Pregnant or trying to get pregnant Breast-feeding How should I use this medication? This medication is injected under the skin. You will be taught how to prepare and give it. Take it as directed on the prescription label. It is given once every week (every 7 days). Keep taking it unless your care team tells you to stop. It is important that you put your used needles and pens in a special sharps container. Do not put them in a trash can. If you do not have a sharps container, call your pharmacist or care team to get one. A special MedGuide will be given to you by the pharmacist with each prescription and refill. Be sure to read this information carefully each time. This medication comes with INSTRUCTIONS FOR USE. Ask your pharmacist for directions on how to use this medication. Read the information carefully. Talk to your pharmacist or care team if you have questions. Talk to your care team about the use of this medication in children. While it may be prescribed for children as young as 12 years for selected conditions, precautions do apply. Overdosage: If you think you have taken too much of this medicine contact a   poison control center or emergency room at once. NOTE: This medicine is only for you. Do not share this medicine with others. What if I miss a dose? If you miss a dose and the next scheduled dose is more than 2 days away, take the missed dose as soon as possible. If you miss a dose and the next scheduled dose is less than 2 days away, do not take the missed dose. Take the next dose at your regular time. Do not take double or extra doses. If you miss your dose for 2  weeks or more, take the next dose at your regular time or call your care team to talk about how to restart this medication. What may interact with this medication? Insulin and other medications for diabetes This list may not describe all possible interactions. Give your health care provider a list of all the medicines, herbs, non-prescription drugs, or dietary supplements you use. Also tell them if you smoke, drink alcohol, or use illegal drugs. Some items may interact with your medicine. What should I watch for while using this medication? Visit your care team for regular checks on your progress. It may be some time before you see the benefit from this medication. Drink plenty of fluids while taking this medication. Check with your care team if you have severe diarrhea, nausea, and vomiting, or if you sweat a lot. The loss of too much body fluid may make it dangerous for you to take this medication. This medication may affect blood sugar levels. Ask your care team if changes in diet or medications are needed if you have diabetes. If you or your family notice any changes in your behavior, such as new or worsening depression, thoughts of harming yourself, anxiety, other unusual or disturbing thoughts, or memory loss, call your care team right away. Women should inform their care team if they wish to become pregnant or think they might be pregnant. Losing weight while pregnant is not advised and may cause harm to the unborn child. Talk to your care team for more information. What side effects may I notice from receiving this medication? Side effects that you should report to your care team as soon as possible: Allergic reactions--skin rash, itching, hives, swelling of the face, lips, tongue, or throat Change in vision Dehydration--increased thirst, dry mouth, feeling faint or lightheaded, headache, dark yellow or brown urine Gallbladder problems--severe stomach pain, nausea, vomiting, fever Heart  palpitations--rapid, pounding, or irregular heartbeat Kidney injury--decrease in the amount of urine, swelling of the ankles, hands, or feet Pancreatitis--severe stomach pain that spreads to your back or gets worse after eating or when touched, fever, nausea, vomiting Thoughts of suicide or self-harm, worsening mood, feelings of depression Thyroid cancer--new mass or lump in the neck, pain or trouble swallowing, trouble breathing, hoarseness Side effects that usually do not require medical attention (report to your care team if they continue or are bothersome): Diarrhea Loss of appetite Nausea Stomach pain Vomiting This list may not describe all possible side effects. Call your doctor for medical advice about side effects. You may report side effects to FDA at 1-800-FDA-1088. Where should I keep my medication? Keep out of the reach of children and pets. Refrigeration (preferred): Store in the refrigerator. Do not freeze. Keep this medication in the original container until you are ready to take it. Get rid of any unused medication after the expiration date. Room temperature: If needed, prior to cap removal, the pen can be stored at room temperature   for up to 28 days. Protect from light. If it is stored at room temperature, get rid of any unused medication after 28 days or after it expires, whichever is first. It is important to get rid of the medication as soon as you no longer need it or it is expired. You can do this in two ways: Take the medication to a medication take-back program. Check with your pharmacy or law enforcement to find a location. If you cannot return the medication, follow the directions in the MedGuide. NOTE: This sheet is a summary. It may not cover all possible information. If you have questions about this medicine, talk to your doctor, pharmacist, or health care provider.  2023 Elsevier/Gold Standard (2020-05-29 00:00:00)  

## 2022-09-22 ENCOUNTER — Other Ambulatory Visit (HOSPITAL_COMMUNITY): Payer: Self-pay

## 2022-09-22 LAB — IRON,TIBC AND FERRITIN PANEL
%SAT: 11 % (calc) — ABNORMAL LOW (ref 16–45)
Ferritin: 24 ng/mL (ref 16–154)
Iron: 47 ug/dL (ref 40–190)
TIBC: 430 mcg/dL (calc) (ref 250–450)

## 2022-09-22 NOTE — Addendum Note (Signed)
Addended by: Allegra Grana on: 09/22/2022 01:21 PM   Modules accepted: Orders

## 2022-09-22 NOTE — Telephone Encounter (Signed)
Per test claim, no PA is required at this time. Medication was filled 09/21/2022 at [facility].

## 2022-09-23 ENCOUNTER — Other Ambulatory Visit: Payer: Self-pay | Admitting: *Deleted

## 2022-09-23 ENCOUNTER — Encounter: Payer: Self-pay | Admitting: *Deleted

## 2022-09-23 DIAGNOSIS — D649 Anemia, unspecified: Secondary | ICD-10-CM

## 2022-09-23 DIAGNOSIS — R5383 Other fatigue: Secondary | ICD-10-CM

## 2022-10-06 ENCOUNTER — Encounter: Payer: Self-pay | Admitting: Family

## 2022-11-18 ENCOUNTER — Other Ambulatory Visit: Payer: BC Managed Care – PPO

## 2023-01-06 ENCOUNTER — Other Ambulatory Visit (HOSPITAL_COMMUNITY)
Admission: RE | Admit: 2023-01-06 | Discharge: 2023-01-06 | Disposition: A | Discharge status: Home / Self Care | Payer: BC Managed Care – PPO | Source: Ambulatory Visit | Attending: Family | Admitting: Family

## 2023-01-06 ENCOUNTER — Ambulatory Visit: Payer: BC Managed Care – PPO | Admitting: Family

## 2023-01-06 ENCOUNTER — Encounter: Payer: Self-pay | Admitting: Family

## 2023-01-06 VITALS — BP 130/76 | HR 79 | Temp 98.3°F | Ht 64.0 in | Wt 283.2 lb

## 2023-01-06 DIAGNOSIS — N898 Other specified noninflammatory disorders of vagina: Secondary | ICD-10-CM | POA: Diagnosis not present

## 2023-01-06 DIAGNOSIS — Z6841 Body Mass Index (BMI) 40.0 and over, adult: Secondary | ICD-10-CM

## 2023-01-06 DIAGNOSIS — E66813 Obesity, class 3: Secondary | ICD-10-CM

## 2023-01-06 DIAGNOSIS — Z124 Encounter for screening for malignant neoplasm of cervix: Secondary | ICD-10-CM | POA: Diagnosis not present

## 2023-01-06 MED ORDER — BUPROPION HCL ER (XL) 150 MG PO TB24
150.0000 mg | ORAL_TABLET | Freq: Every morning | ORAL | 1 refills | Status: DC
Start: 2023-01-06 — End: 2023-03-11

## 2023-01-06 NOTE — Progress Notes (Signed)
Assessment & Plan:  Class 3 severe obesity without serious comorbidity with body mass index (BMI) of 45.0 to 49.9 in adult, unspecified obesity type [facility]) Assessment & Plan: Lab Results  Component Value Date   HGBA1C 5.4 09/21/2022   We agreed trial of Wellbutrin 150mg  is appropriate.  Discussed side effects including irritability, anxiety and Wellbutrin relative contraindication with alcohol.  Patient will also reach out to insurance company to see if her cover Saxenda as she had previously been on  Orders: -     buPROPion HCl ER (XL); Take 1 tablet (150 mg total) by mouth every morning.  Dispense: 90 tablet; Refill: 1  Vaginal itching -     Cervicovaginal ancillary only -     Cytology - PAP  Cervical cancer screening Assessment & Plan: Screen for STD and Pap smear obtained today      Return precautions given.   Risks, benefits, and alternatives of the medications and treatment plan prescribed today were discussed, and patient expressed understanding.   Education regarding symptom management and diagnosis given to patient on AVS either electronically or printed.  No follow-ups on file.  Rennie Plowman, FNP  Subjective:    Patient ID: Cassandra Vazquez, female    DOB: 08-10-1985, 37 y.o.   MRN: [id number]  CC: Hadyn Vazquez is a 37 y.o. female who presents today for an acute visit.    HPI: Here today for concern of STD exposure by her husband Denies dysuria, unusual vaginal discharge, pelvic pain   Pap smear due; last obtained 11/02/2019, negative malignancy, negative HPV  Years ago short trial Wellbutrin, patient does not recall if this medication was effective for her.  Most recently insurance declined coverage for [facility].  She is interested in weight loss medication  No h/o anorexia, bulmnia No h/o seizure  No history of heavy alcohol use.  Occasionally she may have an alcoholic beverage  Allergies: Patient has no known allergies. Current Outpatient Medications on  File Prior to Visit  Medication Sig Dispense Refill   levonorgestrel-ethinyl estradiol (ALESSE) 0.1-20 MG-MCG tablet Take 1 tablet by mouth daily. 90 tablet 4   No current facility-administered medications on file prior to visit.    Review of Systems  Constitutional:  Negative for chills and fever.  Respiratory:  Negative for cough.   Cardiovascular:  Negative for chest pain and palpitations.  Gastrointestinal:  Negative for nausea and vomiting.  Genitourinary:  Negative for dysuria, vaginal discharge and vaginal pain.      Objective:    BP 130/76   Pulse 79   Temp 98.3 F (36.8 C) (Oral)   Ht 5\' 4"  (1.626 m)   Wt 283 lb 3.2 oz (128.5 kg)   LMP  (LMP Unknown)   SpO2 99%   BMI 48.61 kg/m   BP Readings from Last 3 Encounters:  01/06/23 130/76  09/21/22 122/82  05/07/22 118/70   Wt Readings from Last 3 Encounters:  01/06/23 283 lb 3.2 oz (128.5 kg)  09/21/22 283 lb (128.4 kg)  07/26/22 183 lb 3.2 oz (83.1 kg)    Physical Exam Vitals reviewed.  Constitutional:      Appearance: She is well-developed.  Eyes:     Conjunctiva/sclera: Conjunctivae normal.  Cardiovascular:     Rate and Rhythm: Normal rate and regular rhythm.     Pulses: Normal pulses.     Heart sounds: Normal heart sounds.  Pulmonary:     Effort: Pulmonary effort is normal.     Breath sounds: Normal  breath sounds. No wheezing, rhonchi or rales.  Genitourinary:    Labia:        Right: No rash, tenderness or lesion.        Left: No rash, tenderness or lesion.      Vagina: No foreign body. No vaginal discharge, erythema, tenderness or bleeding.     Cervix: No cervical motion tenderness or discharge.     Adnexa:        Right: No mass, tenderness or fullness.         Left: No mass, tenderness or fullness.       Comments: No vulvovaginal erythema. No lesions. Discharge is thin and clear, not purulent.  Pap obtained. Skin:    General: Skin is warm and dry.  Neurological:     Mental Status: She is  alert.  Psychiatric:        Speech: Speech normal.        Behavior: Behavior normal.        Thought Content: Thought content normal.

## 2023-01-06 NOTE — Patient Instructions (Addendum)
Trial wellbutrin 150mg  in the morning.  May increase to 2 tablets in the morning after a few weeks if desired   As also discussed, you must limit alcohol on Wellbutrin as alcohol and Wellbutrin together may increase your risk for seizure.  You may drink no more than 1 alcoholic beverage on this medication.  A standard drink is 12 ounces of regular beer, which is usually about 5% alcohol OR 5 ounces of wine, which is typically about 12% alcohol OR   1.5 ounces of distilled spirits, which is about 40% alcohol   Ask pharmacist about Bernie Covey and let me know if your insurance would cover again.

## 2023-01-06 NOTE — Assessment & Plan Note (Signed)
Screen for STD and Pap smear obtained today

## 2023-01-06 NOTE — Assessment & Plan Note (Signed)
Lab Results  Component Value Date   HGBA1C 5.4 09/21/2022   We agreed trial of Wellbutrin 150mg  is appropriate.  Discussed side effects including irritability, anxiety and Wellbutrin relative contraindication with alcohol.  Patient will also reach out to insurance company to see if her cover Saxenda as she had previously been on

## 2023-01-07 ENCOUNTER — Telehealth: Payer: Self-pay | Admitting: Family

## 2023-01-07 NOTE — Telephone Encounter (Signed)
Cytology not running duplicate test on thin prep that is included in the aptima swab.FYI if included on aptima swab the same results are given.

## 2023-01-10 LAB — CYTOLOGY - PAP
Comment: NEGATIVE
Diagnosis: NEGATIVE
High risk HPV: NEGATIVE

## 2023-01-10 LAB — CERVICOVAGINAL ANCILLARY ONLY
Bacterial Vaginitis (gardnerella): POSITIVE — AB
Candida Glabrata: NEGATIVE
Candida Vaginitis: NEGATIVE
Chlamydia: POSITIVE — AB
Comment: NEGATIVE
Comment: NEGATIVE
Comment: NEGATIVE
Comment: NEGATIVE
Comment: NEGATIVE
Comment: NORMAL
Neisseria Gonorrhea: NEGATIVE
Trichomonas: NEGATIVE

## 2023-01-11 ENCOUNTER — Encounter: Payer: Self-pay | Admitting: Family

## 2023-01-11 ENCOUNTER — Other Ambulatory Visit: Payer: Self-pay | Admitting: Family

## 2023-01-11 DIAGNOSIS — N760 Acute vaginitis: Secondary | ICD-10-CM

## 2023-01-11 DIAGNOSIS — A749 Chlamydial infection, unspecified: Secondary | ICD-10-CM

## 2023-01-11 MED ORDER — DOXYCYCLINE HYCLATE 100 MG PO TABS
100.0000 mg | ORAL_TABLET | Freq: Two times a day (BID) | ORAL | 0 refills | Status: AC
Start: 2023-01-11 — End: 2023-01-18

## 2023-01-11 MED ORDER — METRONIDAZOLE 0.75 % EX GEL
1.0000 | Freq: Every day | CUTANEOUS | 0 refills | Status: AC
Start: 2023-01-11 — End: 2023-01-16

## 2023-01-12 ENCOUNTER — Other Ambulatory Visit: Payer: Self-pay | Admitting: Family

## 2023-01-12 DIAGNOSIS — B9689 Other specified bacterial agents as the cause of diseases classified elsewhere: Secondary | ICD-10-CM

## 2023-01-12 MED ORDER — METRONIDAZOLE 0.75 % VA GEL
1.0000 | Freq: Every day | VAGINAL | 0 refills | Status: AC
Start: 2023-01-12 — End: 2023-01-17

## 2023-01-12 NOTE — Telephone Encounter (Signed)
Spoke to [facility] @ pharmacy and pt  did pick up the correct vaginal gel that is to be  inserted into the vagina

## 2023-01-13 NOTE — Telephone Encounter (Signed)
Spoke to pharmacy see previous note!

## 2023-01-14 NOTE — Telephone Encounter (Signed)
Spoke to pt and she did see result note and she did get the proper gel applicator

## 2023-01-17 ENCOUNTER — Other Ambulatory Visit: Payer: Self-pay | Admitting: Family

## 2023-01-17 DIAGNOSIS — N760 Acute vaginitis: Secondary | ICD-10-CM

## 2023-01-20 NOTE — Telephone Encounter (Signed)
Called and left a vice message asking patient to give the office a call in reference to Rx refill request.

## 2023-01-31 NOTE — Telephone Encounter (Signed)
Spoke to pt and scheduled her for 02/07/23 appt in office for retesting

## 2023-02-07 ENCOUNTER — Encounter: Payer: Self-pay | Admitting: Family

## 2023-02-07 ENCOUNTER — Other Ambulatory Visit (HOSPITAL_COMMUNITY)
Admission: RE | Admit: 2023-02-07 | Discharge: 2023-02-07 | Disposition: A | Discharge status: Home / Self Care | Payer: BC Managed Care – PPO | Source: Ambulatory Visit | Attending: Family | Admitting: Family

## 2023-02-07 ENCOUNTER — Ambulatory Visit: Payer: BC Managed Care – PPO | Admitting: Family

## 2023-02-07 VITALS — BP 132/70 | HR 78 | Temp 98.1°F | Ht 64.0 in | Wt 278.9 lb

## 2023-02-07 DIAGNOSIS — N760 Acute vaginitis: Secondary | ICD-10-CM | POA: Insufficient documentation

## 2023-02-07 NOTE — Progress Notes (Signed)
Assessment & Plan:  Acute vaginitis Assessment & Plan: Clinically asymptomatic  positive chlamydia, BV 01/06/23 She has completed doxycycline 100 mg twice daily x 7 days and Metrogel  Test of cure today obtained.   Orders: -     Cervicovaginal ancillary only     Return precautions given.   Risks, benefits, and alternatives of the medications and treatment plan prescribed today were discussed, and patient expressed understanding.   Education regarding symptom management and diagnosis given to patient on AVS either electronically or printed.  No follow-ups on file.  Rennie Plowman, FNP  Subjective:    Patient ID: Cassandra Vazquez, female    DOB: March 04, 1986, 37 y.o.   MRN: [id number]  CC: Kalee Prior is a 37 y.o. female who presents today for an acute visit.    HPI: Feels well today.  No further concerns or abnormal vaginal discharge.  She is on her menses today.   A week ago she had noticed faint yellow discharge after the MetroGel , since resolved   Pap smear obtained 01/06/2023 negative malignancy, negative HPV  Positive chlamydia, BV 01/06/23 She has completed doxycycline 100 mg twice daily x 7 days, Metrogel      Allergies: Patient has no known allergies. Current Outpatient Medications on File Prior to Visit  Medication Sig Dispense Refill   buPROPion (WELLBUTRIN XL) 150 MG 24 hr tablet Take 1 tablet (150 mg total) by mouth every morning. 90 tablet 1   levonorgestrel-ethinyl estradiol (ALESSE) 0.1-20 MG-MCG tablet Take 1 tablet by mouth daily. 90 tablet 4   No current facility-administered medications on file prior to visit.    Review of Systems  Constitutional:  Negative for chills and fever.  Respiratory:  Negative for cough.   Cardiovascular:  Negative for chest pain and palpitations.  Gastrointestinal:  Negative for nausea and vomiting.  Genitourinary:  Positive for vaginal bleeding. Negative for pelvic pain and vaginal discharge.      Objective:     BP 132/70   Pulse 78   Temp 98.1 F (36.7 C) (Oral)   Ht 5\' 4"  (1.626 m)   Wt 278 lb 14.4 oz (126.5 kg)   LMP 02/07/2023   SpO2 97%   BMI 47.87 kg/m   BP Readings from Last 3 Encounters:  02/07/23 132/70  01/06/23 130/76  09/21/22 122/82   Wt Readings from Last 3 Encounters:  02/07/23 278 lb 14.4 oz (126.5 kg)  01/06/23 283 lb 3.2 oz (128.5 kg)  09/21/22 283 lb (128.4 kg)    Physical Exam Vitals reviewed.  Constitutional:      Appearance: She is well-developed.  Eyes:     Conjunctiva/sclera: Conjunctivae normal.  Cardiovascular:     Rate and Rhythm: Normal rate and regular rhythm.     Pulses: Normal pulses.     Heart sounds: Normal heart sounds.  Pulmonary:     Effort: Pulmonary effort is normal.     Breath sounds: Normal breath sounds. No wheezing, rhonchi or rales.  Genitourinary:    Labia:        Right: No rash, tenderness or lesion.        Left: No rash, tenderness or lesion.      Vagina: No foreign body. No vaginal discharge, erythema, tenderness or bleeding.     Cervix: No cervical motion tenderness or discharge.     Adnexa:        Right: No mass, tenderness or fullness.         Left: No mass,  tenderness or fullness.       Comments: No vulvovaginal erythema. No lesions. Discharge is thin and clear, not purulent. BRB in vaginal canal.  Skin:    General: Skin is warm and dry.  Neurological:     Mental Status: She is alert.  Psychiatric:        Speech: Speech normal.        Behavior: Behavior normal.        Thought Content: Thought content normal.

## 2023-02-07 NOTE — Patient Instructions (Signed)
We have retested today.  Please let me know if you experience any further abnormal vaginal discharge or discolored vaginal discharge as I would recommend another vaginal test.

## 2023-02-07 NOTE — Assessment & Plan Note (Signed)
Clinically asymptomatic  positive chlamydia, BV 01/06/23 She has completed doxycycline 100 mg twice daily x 7 days and Metrogel  Test of cure today obtained.

## 2023-02-08 LAB — CERVICOVAGINAL ANCILLARY ONLY
Bacterial Vaginitis (gardnerella): POSITIVE — AB
Candida Glabrata: NEGATIVE
Candida Vaginitis: NEGATIVE
Chlamydia: NEGATIVE
Comment: NEGATIVE
Comment: NEGATIVE
Comment: NEGATIVE
Comment: NEGATIVE
Comment: NEGATIVE
Comment: NORMAL
Neisseria Gonorrhea: NEGATIVE
Trichomonas: NEGATIVE

## 2023-02-10 ENCOUNTER — Other Ambulatory Visit: Payer: Self-pay | Admitting: Family

## 2023-02-10 DIAGNOSIS — N760 Acute vaginitis: Secondary | ICD-10-CM

## 2023-02-10 MED ORDER — METRONIDAZOLE 500 MG PO TABS
500.0000 mg | ORAL_TABLET | Freq: Two times a day (BID) | ORAL | 0 refills | Status: DC
Start: 2023-02-10 — End: 2023-03-16

## 2023-03-11 ENCOUNTER — Encounter: Payer: Self-pay | Admitting: Family

## 2023-03-11 ENCOUNTER — Other Ambulatory Visit (HOSPITAL_COMMUNITY)
Admission: RE | Admit: 2023-03-11 | Discharge: 2023-03-11 | Disposition: A | Discharge status: Home / Self Care | Payer: BC Managed Care – PPO | Source: Ambulatory Visit | Attending: Family | Admitting: Family

## 2023-03-11 ENCOUNTER — Ambulatory Visit: Payer: BC Managed Care – PPO | Admitting: Family

## 2023-03-11 VITALS — BP 130/78 | HR 70 | Temp 98.0°F | Ht 64.0 in | Wt 272.8 lb

## 2023-03-11 DIAGNOSIS — N760 Acute vaginitis: Secondary | ICD-10-CM | POA: Insufficient documentation

## 2023-03-11 DIAGNOSIS — F339 Major depressive disorder, recurrent, unspecified: Secondary | ICD-10-CM

## 2023-03-11 MED ORDER — BUPROPION HCL ER (XL) 300 MG PO TB24
300.0000 mg | ORAL_TABLET | Freq: Every day | ORAL | 3 refills | Status: DC
Start: 2023-03-11 — End: 2023-08-15

## 2023-03-11 NOTE — Assessment & Plan Note (Signed)
Asymptomatic.  Discussed rationale for asymptomatic patients in observation rather than antibiotic treatment .   Will await wet prep results.  Patient preferred to self swab

## 2023-03-11 NOTE — Progress Notes (Signed)
Assessment & Plan:  Acute vaginitis Assessment & Plan: Asymptomatic.  Discussed rationale for asymptomatic patients in observation rather than antibiotic treatment .   Will await wet prep results.  Patient preferred to self swab  Orders: -     Cervicovaginal ancillary only  Depression, recurrent (HCC) Assessment & Plan: Chronic, stable.  Continue Wellbutrin 30 mg daily  Orders: -     buPROPion HCl ER (XL); Take 1 tablet (300 mg total) by mouth daily.  Dispense: 90 tablet; Refill: 3     Return precautions given.   Risks, benefits, and alternatives of the medications and treatment plan prescribed today were discussed, and patient expressed understanding.   Education regarding symptom management and diagnosis given to patient on AVS either electronically or printed.  Return for Complete Physical Exam.  Rennie Plowman, FNP  Subjective:    Patient ID: Cassandra Vazquez, female    DOB: [birth year], 37 y.o.   MRN: [id number]  CC: Cassandra Vazquez is a 37 y.o. female who presents today for follow up.   HPI: Feels well today.  No new complaints.  She would like to be retested bacterial vaginosis.  Denies increased vaginal discharge, odor, dysuria  She is doing well on Wellbutrin 100 mg daily and would like a refill     Allergies: Patient has no known allergies. Current Outpatient Medications on File Prior to Visit  Medication Sig Dispense Refill   levonorgestrel-ethinyl estradiol (ALESSE) 0.1-20 MG-MCG tablet Take 1 tablet by mouth daily. 90 tablet 4   metroNIDAZOLE (FLAGYL) 500 MG tablet Take 1 tablet (500 mg total) by mouth 2 (two) times daily. (Patient not taking: Reported on 03/11/2023) 14 tablet 0   No current facility-administered medications on file prior to visit.    Review of Systems  Constitutional:  Negative for chills and fever.  Respiratory:  Negative for cough.   Cardiovascular:  Negative for chest pain and palpitations.  Gastrointestinal:  Negative for nausea and  vomiting.  Genitourinary:  Negative for difficulty urinating, pelvic pain, urgency, vaginal discharge and vaginal pain.      Objective:    BP 130/78   Pulse 70   Temp 98 F (36.7 C) (Oral)   Ht 5\' 4"  (1.626 m)   Wt 272 lb 12.8 oz (123.7 kg)   SpO2 99%   BMI 46.83 kg/m  BP Readings from Last 3 Encounters:  03/11/23 130/78  02/07/23 132/70  01/06/23 130/76   Wt Readings from Last 3 Encounters:  03/11/23 272 lb 12.8 oz (123.7 kg)  02/07/23 278 lb 14.4 oz (126.5 kg)  01/06/23 283 lb 3.2 oz (128.5 kg)      03/11/2023    8:44 AM 02/07/2023   11:45 AM 01/06/2023    3:34 PM  Depression screen PHQ 2/9  Decreased Interest 0 0 0  Down, Depressed, Hopeless 0 0 0  PHQ - 2 Score 0 0 0    Physical Exam Vitals reviewed.  Constitutional:      Appearance: She is well-developed.  Eyes:     Conjunctiva/sclera: Conjunctivae normal.  Cardiovascular:     Rate and Rhythm: Normal rate and regular rhythm.     Pulses: Normal pulses.     Heart sounds: Normal heart sounds.  Pulmonary:     Effort: Pulmonary effort is normal.     Breath sounds: Normal breath sounds. No wheezing, rhonchi or rales.  Skin:    General: Skin is warm and dry.  Neurological:     Mental Status: She is alert.  Psychiatric:        Speech: Speech normal.        Behavior: Behavior normal.        Thought Content: Thought content normal.

## 2023-03-11 NOTE — Assessment & Plan Note (Signed)
Chronic, stable.  Continue Wellbutrin 30 mg daily

## 2023-03-14 LAB — CERVICOVAGINAL ANCILLARY ONLY
Bacterial Vaginitis (gardnerella): POSITIVE — AB
Candida Glabrata: NEGATIVE
Candida Vaginitis: NEGATIVE
Chlamydia: NEGATIVE
Comment: NEGATIVE
Comment: NEGATIVE
Comment: NEGATIVE
Comment: NEGATIVE
Comment: NEGATIVE
Comment: NORMAL
Neisseria Gonorrhea: NEGATIVE
Trichomonas: NEGATIVE

## 2023-03-16 ENCOUNTER — Other Ambulatory Visit: Payer: Self-pay | Admitting: Family

## 2023-03-16 DIAGNOSIS — N760 Acute vaginitis: Secondary | ICD-10-CM

## 2023-03-16 MED ORDER — METRONIDAZOLE 0.75 % VA GEL
VAGINAL | 0 refills | Status: DC
Start: 2023-03-16 — End: 2023-07-25

## 2023-03-16 MED ORDER — METRONIDAZOLE 500 MG PO TABS
500.0000 mg | ORAL_TABLET | Freq: Two times a day (BID) | ORAL | 0 refills | Status: DC
Start: 2023-03-16 — End: 2023-07-25

## 2023-03-25 ENCOUNTER — Ambulatory Visit: Payer: BC Managed Care – PPO | Admitting: Family

## 2023-04-05 ENCOUNTER — Encounter: Payer: Self-pay | Admitting: Family

## 2023-04-08 ENCOUNTER — Ambulatory Visit: Payer: Self-pay

## 2023-04-12 ENCOUNTER — Ambulatory Visit: Payer: Self-pay | Admitting: Family Medicine

## 2023-04-12 ENCOUNTER — Encounter: Payer: Self-pay | Admitting: Family Medicine

## 2023-04-12 DIAGNOSIS — Z113 Encounter for screening for infections with a predominantly sexual mode of transmission: Secondary | ICD-10-CM

## 2023-04-12 LAB — HM HIV SCREENING LAB: HM HIV Screening: NEGATIVE

## 2023-04-12 NOTE — Progress Notes (Signed)
 [facility] Department STI clinic [address], [address] [place] [address] Main phone: [phone]  STI screening visit  Subjective:  Cassandra Vazquez is a 38 y.o. female being seen today for an STI screening visit. The patient reports they do not have symptoms.  Patient reports that they do not desire a pregnancy in the next year.   They reported they are not interested in discussing contraception today.    Patient's last menstrual period was 03/25/2023.  Patient has the following medical conditions:   Patient Active Problem List   Diagnosis Date Noted   Vaginitis 02/07/2023   Cervical cancer screening 01/06/2023   B12 deficiency 03/27/2021   Sinusitis 07/08/2020   Elevated WBC count 12/17/2019   Birth control counseling 11/02/2019   Vitamin D  deficiency 02/22/2018   Depression, recurrent (HCC) 09/02/2017   Fatigue 09/02/2017   Routine physical examination 11/08/2016   History of gestational diabetes in prior pregnancy, currently pregnant 10/22/2016   Obesity 10/21/2016    Chief Complaint  Patient presents with   Annual Exam   SEXUALLY TRANSMITTED DISEASE    Pt is here for STD screening and has no symptoms.    HPI HPI Patient reports  to clinic for STI testing. States last sex was 1 month ago- reports she feels old and that her discharge is different.  Does the patient using douching products? No  Last HIV test per patient/review of record was No results found for: HMHIVSCREEN  Lab Results  Component Value Date   HIV NON-REACTIVE 05/07/2022     Last HEPC test per patient/review of record was No results found for: HMHEPCSCREEN No components found for: HEPC   Last HEPB test per patient/review of record was No components found for: HMHEPBSCREEN No components found for: HEPC   Patient reports last pap was: 2024     Component Value Date/Time   DIAGPAP  01/06/2023 1621    - Negative for intraepithelial lesion or malignancy  (NILM)   DIAGPAP  05/07/2022 1218    - Negative for intraepithelial lesion or malignancy (NILM)   DIAGPAP  11/02/2019 0958    - Negative for intraepithelial lesion or malignancy (NILM)   HPVHIGH Negative 01/06/2023 1621   HPVHIGH Negative 05/07/2022 1218   HPVHIGH Negative 11/02/2019 0958   ADEQPAP  01/06/2023 1621    Satisfactory for evaluation; transformation zone component PRESENT.   ADEQPAP  05/07/2022 1218    Satisfactory for evaluation; transformation zone component PRESENT.   ADEQPAP  11/02/2019 0958    Satisfactory for evaluation; transformation zone component PRESENT.   No results found for: SPECADGYN Result Date Procedure Results Follow-ups  01/06/2023 Cytology - PAP High risk HPV: Negative Adequacy: Satisfactory for evaluation; transformation zone component PRESENT. Diagnosis: - Negative for intraepithelial lesion or malignancy (NILM) Comment: Normal Reference Range HPV - Negative   05/07/2022 Cytology - PAP( [place]) High risk HPV: Negative Comment: Normal Reference Range Neisseria Gonorrhea - Negative Neisseria Gonorrhea: Negative Chlamydia: Negative Trichomonas: Negative Adequacy: Satisfactory for evaluation; transformation zone component PRESENT. Diagnosis: - Negative for intraepithelial lesion or malignancy (NILM) Comment: Normal Reference Range HPV - Negative Comment: Normal Reference Range Trichomonas - Negative Comment: Normal Reference [place] Chlamydia - Negative   11/02/2019 Cytology - PAP High risk HPV: Negative Adequacy: Satisfactory for evaluation; transformation zone component PRESENT. Diagnosis: - Negative for intraepithelial lesion or malignancy (NILM) Comment: Normal Reference Range HPV - Negative   05/19/2016 Cytology - PAP Pap: Negative for intraephithelial lesion or malignancy  Screening for MPX risk: Does the patient have an unexplained rash? No Is the patient MSM? No Does the patient endorse multiple sex partners or anonymous sex partners?  No Did the patient have close or sexual contact with a person diagnosed with MPX? No Has the patient traveled outside the [place]  where MPX is endemic? No Is there a high clinical suspicion for MPX-- evidenced by one of the following No  -Unlikely to be chickenpox  -Lymphadenopathy  -Rash that present in same phase of evolution on any given body part See flowsheet for further details and programmatic requirements.   Immunization history:  Immunization History  Administered Date(s) Administered   Moderna Sars-Covid-2 Vaccination 06/11/2019, 07/09/2019   PPD Test 11/08/2016   Tdap 11/04/2016     The following portions of the patient's history were reviewed and updated as appropriate: allergies, current medications, past medical history, past social history, past surgical history and problem list.  Objective:  There were no vitals filed for this visit.  Physical Exam Vitals and nursing note reviewed.  Constitutional:      Appearance: Normal appearance.  HENT:     Head: Normocephalic and atraumatic.     Mouth/Throat:     Mouth: Mucous membranes are moist.     Pharynx: Oropharynx is clear. No oropharyngeal exudate or posterior oropharyngeal erythema.  Pulmonary:     Effort: Pulmonary effort is normal.  Abdominal:     General: Abdomen is flat.     Palpations: There is no mass.     Tenderness: There is no abdominal tenderness. There is no rebound.  Genitourinary:    Comments: Declined genital exam- self swabbed Lymphadenopathy:     Head:     Right side of head: No preauricular or posterior auricular adenopathy.     Left side of head: No preauricular or posterior auricular adenopathy.     Cervical: No cervical adenopathy.     Upper Body:     Right upper body: No supraclavicular, axillary or epitrochlear adenopathy.     Left upper body: No supraclavicular, axillary or epitrochlear adenopathy.  Skin:    General: Skin is warm and dry.     Findings: No rash.  Neurological:      Mental Status: She is alert and oriented to person, place, and time.     Assessment and Plan:  Cassandra Vazquez is a 38 y.o. female presenting to the [facility] Department for STI screening  1. Screening for venereal disease (Primary)  - Chlamydia/Gonorrhea [facility] - HIV [place] [facility] - Syphilis Serology,  Lab - WET PREP FOR TRICH, YEAST, CLUE   Patient accepted all screenings including vaginal CT/GC and bloodwork for HIV/RPR, and wet prep. Patient meets criteria for HepB screening? No. Ordered? not applicable Patient meets criteria for HepC screening? No. Ordered? not applicable  Treat wet prep per standing order Discussed time line for [facility] results and that patient will be called with positive results and encouraged patient to call if she had not heard in 2 weeks.  Counseled to return or seek care for continued or worsening symptoms Recommended repeat testing in 3 months with positive results. Recommended condom use with all sex for STI prevention.   Patient is currently using  none  to prevent pregnancy.    Return if symptoms worsen or fail to improve, for STI screening.  Future Appointments  Date Time Provider Department Center  05/17/2023  9:00 AM Arnett, Rollene MATSU, FNP [id number]  Verneta Bers, [place]

## 2023-04-12 NOTE — Progress Notes (Signed)
 Pt is here for STD screening , Wet prep results reviewed with pt, no treatment required. Condoms declined. Sonda Primes, RN.

## 2023-04-13 LAB — WET PREP FOR TRICH, YEAST, CLUE
Trichomonas Exam: NEGATIVE
Yeast Exam: NEGATIVE

## 2023-05-17 ENCOUNTER — Encounter: Payer: 59 | Admitting: Family

## 2023-05-17 ENCOUNTER — Encounter: Payer: Self-pay | Admitting: Family

## 2023-05-17 NOTE — Patient Instructions (Incomplete)

## 2023-05-17 NOTE — Progress Notes (Deleted)
   Assessment & Plan:  There are no diagnoses linked to this encounter.   Return precautions given.   Risks, benefits, and alternatives of the medications and treatment plan prescribed today were discussed, and patient expressed understanding.   Education regarding symptom management and diagnosis given to patient on AVS either electronically or printed.  No follow-ups on file.  Rennie Plowman, FNP  Subjective:    Patient ID: Cassandra Vazquez, female    DOB: 04/15/1985, 38 y.o.   MRN: [id number]  CC: Cassandra Vazquez is a 38 y.o. female who presents today for physical exam.    HPI: HPI  No early family history of breast or colon cancer Cervical Cancer Screening: UTD, obtained 01/06/2023 negative HPV, negative malignancy         Tetanus - UTD        Exercise: Gets regular exercise.   Alcohol use: Beer on occasion Smoking/tobacco use: Nonsmoker.    Health Maintenance  Topic Date Due   COVID-19 Vaccine (3 - Moderna risk series) 08/06/2019   Flu Shot  06/27/2023*   DTaP/Tdap/Td vaccine (2 - Td or Tdap) 11/05/2026   Pap with HPV screening  01/06/2028   Hepatitis C Screening  Completed   HIV Screening  Completed   HPV Vaccine  Aged Out   Pneumococcal Vaccination  Discontinued  *Topic was postponed. The date shown is not the original due date.    ALLERGIES: Patient has no known allergies.  Current Outpatient Medications on File Prior to Visit  Medication Sig Dispense Refill   buPROPion (WELLBUTRIN XL) 300 MG 24 hr tablet Take 1 tablet (300 mg total) by mouth daily. 90 tablet 3   levonorgestrel-ethinyl estradiol (ALESSE) 0.1-20 MG-MCG tablet Take 1 tablet by mouth daily. (Patient not taking: Reported on 04/12/2023) 90 tablet 4   metroNIDAZOLE (FLAGYL) 500 MG tablet Take 1 tablet (500 mg total) by mouth 2 (two) times daily. (Patient not taking: Reported on 04/12/2023) 14 tablet 0   metroNIDAZOLE (METROGEL) 0.75 % vaginal gel One application placed intravaginally twice a week for four to  six months. (Patient not taking: Reported on 04/12/2023) 70 g 0   No current facility-administered medications on file prior to visit.    Review of Systems    Objective:    There were no vitals taken for this visit.  BP Readings from Last 3 Encounters:  03/11/23 130/78  02/07/23 132/70  01/06/23 130/76   Wt Readings from Last 3 Encounters:  03/11/23 272 lb 12.8 oz (123.7 kg)  02/07/23 278 lb 14.4 oz (126.5 kg)  01/06/23 283 lb 3.2 oz (128.5 kg)    Physical Exam

## 2023-07-25 ENCOUNTER — Other Ambulatory Visit (HOSPITAL_COMMUNITY)
Admission: RE | Admit: 2023-07-25 | Discharge: 2023-07-25 | Disposition: A | Discharge status: Home / Self Care | Source: Ambulatory Visit | Attending: Family | Admitting: Family

## 2023-07-25 ENCOUNTER — Encounter: Payer: Self-pay | Admitting: Family

## 2023-07-25 ENCOUNTER — Ambulatory Visit: Admitting: Family

## 2023-07-25 VITALS — BP 130/76 | HR 82 | Temp 98.2°F | Ht 64.0 in | Wt 281.4 lb

## 2023-07-25 DIAGNOSIS — Z113 Encounter for screening for infections with a predominantly sexual mode of transmission: Secondary | ICD-10-CM | POA: Insufficient documentation

## 2023-07-25 DIAGNOSIS — R5383 Other fatigue: Secondary | ICD-10-CM | POA: Diagnosis not present

## 2023-07-25 DIAGNOSIS — R1013 Epigastric pain: Secondary | ICD-10-CM | POA: Diagnosis not present

## 2023-07-25 DIAGNOSIS — F339 Major depressive disorder, recurrent, unspecified: Secondary | ICD-10-CM | POA: Diagnosis not present

## 2023-07-25 LAB — TSH: TSH: 3.28 u[IU]/mL (ref 0.35–5.50)

## 2023-07-25 LAB — COMPREHENSIVE METABOLIC PANEL WITH GFR
ALT: 14 U/L (ref 0–35)
AST: 13 U/L (ref 0–37)
Albumin: 3.9 g/dL (ref 3.5–5.2)
Alkaline Phosphatase: 48 U/L (ref 39–117)
BUN: 10 mg/dL (ref 6–23)
CO2: 25 meq/L (ref 19–32)
Calcium: 8.9 mg/dL (ref 8.4–10.5)
Chloride: 103 meq/L (ref 96–112)
Creatinine, Ser: 0.56 mg/dL (ref 0.40–1.20)
GFR: 117.67 mL/min (ref >60.00)
Glucose, Bld: 93 mg/dL (ref 70–99)
Potassium: 4 meq/L (ref 3.5–5.1)
Sodium: 135 meq/L (ref 135–145)
Total Bilirubin: 0.4 mg/dL (ref 0.2–1.2)
Total Protein: 7.2 g/dL (ref 6.0–8.3)

## 2023-07-25 LAB — CBC WITH DIFFERENTIAL/PLATELET
Basophils Absolute: 0 10*3/uL (ref 0.0–0.1)
Basophils Relative: 0.2 % (ref 0.0–3.0)
Eosinophils Absolute: 0.2 10*3/uL (ref 0.0–0.7)
Eosinophils Relative: 1.9 % (ref 0.0–5.0)
HCT: 35.5 % — ABNORMAL LOW (ref 36.0–46.0)
Hemoglobin: 11.7 g/dL — ABNORMAL LOW (ref 12.0–15.0)
Lymphocytes Relative: 27.1 % (ref 12.0–46.0)
Lymphs Abs: 2.8 10*3/uL (ref 0.7–4.0)
MCHC: 33 g/dL (ref 30.0–36.0)
MCV: 85.6 fl (ref 78.0–100.0)
Monocytes Absolute: 0.5 10*3/uL (ref 0.1–1.0)
Monocytes Relative: 5 % (ref 3.0–12.0)
Neutro Abs: 6.7 10*3/uL (ref 1.4–7.7)
Neutrophils Relative %: 65.8 % (ref 43.0–77.0)
Platelets: 406 10*3/uL — ABNORMAL HIGH (ref 150.0–400.0)
RBC: 4.15 Mil/uL (ref 3.87–5.11)
RDW: 15.1 % (ref 11.5–15.5)
WBC: 10.2 10*3/uL (ref 4.0–10.5)

## 2023-07-25 LAB — LIPID PANEL
Cholesterol: 225 mg/dL — ABNORMAL HIGH (ref 0–200)
HDL: 57.6 mg/dL (ref >39.00)
LDL Cholesterol: 145 mg/dL — ABNORMAL HIGH (ref 0–99)
NonHDL: 167.24
Total CHOL/HDL Ratio: 4
Triglycerides: 111 mg/dL (ref 0.0–149.0)
VLDL: 22.2 mg/dL (ref 0.0–40.0)

## 2023-07-25 LAB — B12 AND FOLATE PANEL
Folate: 9.5 ng/mL (ref >5.9)
Vitamin B-12: 324 pg/mL (ref 211–911)

## 2023-07-25 LAB — VITAMIN D 25 HYDROXY (VIT D DEFICIENCY, FRACTURES): VITD: 12.05 ng/mL — ABNORMAL LOW (ref 30.00–100.00)

## 2023-07-25 MED ORDER — PANTOPRAZOLE SODIUM 20 MG PO TBEC: 20.0000 mg | DELAYED_RELEASE_TABLET | ORAL | 0 refills | Status: DC

## 2023-07-25 NOTE — Assessment & Plan Note (Addendum)
 Chronic, subtle worsening of late. stop bang score 3, concern for sleep apnea. Pending labs,referral to pulmonology

## 2023-07-25 NOTE — Assessment & Plan Note (Addendum)
 Presentation consistent with GERD. No CP today.  Reassuring exam. Less likely cholelithiasis, PUD, costochondritis.  EKG sinus rhythm.  No significant changes when compared to prior 04/09/2020.  Start protonix  20mg  every day.  She politely declines imaging at this time and will let me know how she is doing.

## 2023-07-25 NOTE — Assessment & Plan Note (Signed)
 Chronic, excellent control. She is interested in weaning off of wellbutrin  as doesn't feel that she needs. Directions provided on AVS.

## 2023-07-25 NOTE — Progress Notes (Signed)
 Assessment & Plan:  Epigastric pain Assessment & Plan: Presentation consistent with GERD. No CP today.  Reassuring exam. Less likely cholelithiasis, PUD, costochondritis.  EKG sinus rhythm.  No significant changes when compared to prior 04/09/2020.  Start protonix  20mg  every day.  She politely declines imaging at this time and will let me know how she is doing.   Orders: -     Pantoprazole  Sodium; Take 1 tablet (20 mg total) by mouth every morning for 14 days. Take 30 minutes to hour before breakfast  Dispense: 30 tablet; Refill: 0 -     EKG 12-Lead  Fatigue, unspecified type Assessment & Plan: Chronic, subtle worsening of late. stop bang score 3, concern for sleep apnea. Pending labs,referral to pulmonology  Orders: -     VITAMIN D  25 Hydroxy (Vit-D Deficiency, Fractures) -     CBC with Differential/Platelet -     Comprehensive metabolic panel with GFR -     Lipid panel -     TSH -     Iron, TIBC and Ferritin Panel -     B12 and Folate Panel -     Pulmonary Visit  Screen for STD (sexually transmitted disease) -     Acute Hep Panel & Hep B Surface Ab -     RPR -     HIV Antibody (routine testing w rflx) -     Cervicovaginal ancillary only  Depression, recurrent (HCC) Assessment & Plan: Chronic, excellent control. She is interested in weaning off of wellbutrin  as doesn't feel that she needs. Directions provided on AVS.       Return precautions given.   Risks, benefits, and alternatives of the medications and treatment plan prescribed today were discussed, and patient expressed understanding.   Education regarding symptom management and diagnosis given to patient on AVS either electronically or printed.  Return in about 6 weeks (around 09/05/2023).  Bascom Bossier, FNP  Subjective:    Patient ID: Christeen Cousin, female    DOB: 1985-06-21, 82 y.o.   MRN: [id number]  CC: Cardelia Rochlin is a 38 y.o. female who presents today for follow up.   HPI: Complains of epigastric  chest pain and 'entire stomach' which started last night Pain would move left to right of abdomen.   Epigastric pain resolved with sitting upright, worsen when laying down.   Associated with burping  Denies constipation, diarrhea, N, dysuria, flank pain  She hasn't lifted anything heavy  She took ibuprofen  once and sat up and pain went away.   No CP today.   Occasional nsaid use.   She ate 'late' dinner of lasagna prior to chest pain starting.   Last BM yesterday    She also complains of fatigue x 2 years, however in the last 2 months, feels more noticeable.   During the weekdays she feels 'less tired' as so busy. Fatigue catches up with her on the weekend. She sleeps a lot on the weekend.   Sleeps about 6 hours per day.   Denies F, chills, unusual weight loss, cough, sob  LMP 06/28/23   Denies depression. She doesn't feel that she needs wellbutrin .   Never smoker  Requests STD testing after sexual encounter. Denies changes to vaginal discharge     Allergies: Patient has no known allergies. Current Outpatient Medications on File Prior to Visit  Medication Sig Dispense Refill   buPROPion  (WELLBUTRIN  XL) 300 MG 24 hr tablet Take 1 tablet (300 mg total) by mouth daily. 90 tablet  3   levonorgestrel -ethinyl estradiol (ALESSE) 0.1-20 MG-MCG tablet Take 1 tablet by mouth daily. (Patient not taking: Reported on 07/25/2023) 90 tablet 4   No current facility-administered medications on file prior to visit.    Review of Systems  Constitutional:  Positive for fatigue. Negative for chills, diaphoresis and fever.  Respiratory:  Negative for cough and shortness of breath.   Cardiovascular:  Positive for chest pain. Negative for palpitations and leg swelling.  Gastrointestinal:  Positive for abdominal pain. Negative for constipation, diarrhea, nausea and vomiting.  Genitourinary:  Negative for dysuria, vaginal discharge and vaginal pain.  Neurological:  Negative for headaches.   Psychiatric/Behavioral:  Negative for sleep disturbance. The patient is not nervous/anxious.       Objective:    BP 130/76   Pulse 82   Temp 98.2 F (36.8 C) (Oral)   Ht 5\' 4"  (1.626 m)   Wt 281 lb 6.4 oz (127.6 kg)   LMP  (LMP Unknown)   SpO2 97%   BMI 48.30 kg/m  BP Readings from Last 3 Encounters:  07/25/23 130/76  03/11/23 130/78  02/07/23 132/70   Wt Readings from Last 3 Encounters:  07/25/23 281 lb 6.4 oz (127.6 kg)  03/11/23 272 lb 12.8 oz (123.7 kg)  02/07/23 278 lb 14.4 oz (126.5 kg)    Physical Exam Vitals reviewed.  Constitutional:      Appearance: Normal appearance. She is well-developed.  Eyes:     Conjunctiva/sclera: Conjunctivae normal.  Cardiovascular:     Rate and Rhythm: Normal rate and regular rhythm.     Pulses: Normal pulses.     Heart sounds: Normal heart sounds.  Pulmonary:     Effort: Pulmonary effort is normal.     Breath sounds: Normal breath sounds. No wheezing, rhonchi or rales.  Chest:     Chest wall: No tenderness.     Comments: No pain with deep inspiration.  Abdominal:     General: Bowel sounds are normal. There is no distension.     Palpations: Abdomen is soft. Abdomen is not rigid. There is no fluid wave or mass.     Tenderness: There is no abdominal tenderness. There is no guarding or rebound.  Skin:    General: Skin is warm and dry.  Neurological:     Mental Status: She is alert.  Psychiatric:        Speech: Speech normal.        Behavior: Behavior normal.        Thought Content: Thought content normal.

## 2023-07-25 NOTE — Patient Instructions (Signed)
 To wean off Wellbutrin , please start Wellbutrin  150 mg tablet daily.  After several weeks, as long as you are feeling well, you may take Wellbutrin  150 mg every other day for 1 week and then you may completely stop medication.  I placed a referral to pulmonology for evaluation of sleep apnea.  Let us  know if you dont hear back within a week in regards to an appointment being scheduled.   So that you are aware, if you are [facility] MyChart user , please pay attention to your MyChart messages as you may receive a MyChart message with a phone number to call and schedule this test/appointment own your own from our referral coordinator. This is a new process so I do not want you to miss this message.  If you are not a MyChart user, you will receive a phone call.      Please start protonix 20mg  daily 30 minutes to an hour before breakfast or coffee.  Please let me know abdominal pain recurs

## 2023-07-26 ENCOUNTER — Encounter: Payer: Self-pay | Admitting: Family

## 2023-07-26 LAB — CERVICOVAGINAL ANCILLARY ONLY
Bacterial Vaginitis (gardnerella): POSITIVE — AB
Candida Glabrata: NEGATIVE
Candida Vaginitis: NEGATIVE
Chlamydia: NEGATIVE
Comment: NEGATIVE
Comment: NEGATIVE
Comment: NEGATIVE
Comment: NEGATIVE
Comment: NEGATIVE
Comment: NORMAL
Neisseria Gonorrhea: NEGATIVE
Trichomonas: NEGATIVE

## 2023-07-29 LAB — ACUTE HEP PANEL AND HEP B SURFACE AB
HEPATITIS C ANTIBODY REFILL$(REFL): NONREACTIVE
Hep A IgM: NONREACTIVE
Hep B C IgM: NONREACTIVE
Hepatitis B Surface Ag: NONREACTIVE

## 2023-07-29 LAB — RPR: RPR Ser Ql: NONREACTIVE

## 2023-07-29 LAB — HIV ANTIBODY (ROUTINE TESTING W REFLEX): HIV 1&2 Ab, 4th Generation: NONREACTIVE

## 2023-07-29 LAB — IRON,TIBC AND FERRITIN PANEL
%SAT: 18 % (ref 16–45)
Ferritin: 37 ng/mL (ref 16–154)
Iron: 69 ug/dL (ref 40–190)
TIBC: 379 ug/dL (ref 250–450)

## 2023-07-29 LAB — REFLEX TIQ

## 2023-08-04 ENCOUNTER — Other Ambulatory Visit: Payer: Self-pay | Admitting: Family

## 2023-08-04 DIAGNOSIS — N760 Acute vaginitis: Secondary | ICD-10-CM

## 2023-08-04 MED ORDER — METRONIDAZOLE 500 MG PO TABS: 500.0000 mg | ORAL_TABLET | Freq: Two times a day (BID) | ORAL | 0 refills | Status: DC

## 2023-08-05 ENCOUNTER — Telehealth: Payer: Self-pay

## 2023-08-05 NOTE — Telephone Encounter (Signed)
 LVM to call back to ask following questions   Did you get your menses? And repeat home pregnancy test? .  Please schedule 3 month follow-up with me for recheck of anemia   Last read by Christeen Cousin at 4:31PM on 07/26/2023.

## 2023-08-09 ENCOUNTER — Ambulatory Visit: Payer: Self-pay

## 2023-08-09 ENCOUNTER — Ambulatory Visit: Admitting: Sleep Medicine

## 2023-08-15 ENCOUNTER — Ambulatory Visit: Admitting: Sleep Medicine

## 2023-08-15 ENCOUNTER — Encounter: Payer: Self-pay | Admitting: Family

## 2023-08-15 ENCOUNTER — Telehealth: Admitting: Family

## 2023-08-15 VITALS — Ht 64.0 in | Wt 281.4 lb

## 2023-08-15 DIAGNOSIS — F339 Major depressive disorder, recurrent, unspecified: Secondary | ICD-10-CM | POA: Diagnosis not present

## 2023-08-15 DIAGNOSIS — E785 Hyperlipidemia, unspecified: Secondary | ICD-10-CM | POA: Diagnosis not present

## 2023-08-15 NOTE — Progress Notes (Signed)
 Virtual Visit via Video Note  I connected with Cassandra Vazquez on 08/15/23 at 11:30 AM EDT by a video enabled telemedicine application and verified that I am speaking with the correct person using two identifiers. Location patient: home Location provider: work  Persons participating in the virtual visit: patient, provider  I discussed the limitations of evaluation and management by telemedicine and the availability of in person appointments. The patient expressed understanding and agreed to proceed.  HPI: Feels today.  No new complaints   Denies n, vaginal bleeding.  She is concerned with cholesterol increase.  Endorses convenience meals and dietary indiscretion.   OB appointment 09/28/2023   [redacted] weeks pregnant  She decreased wellbutrin  to 150mg  every day and when she found out that she was pregnant , she stopped medication all together. She doesn't feel that she needs at this time.       ROS: See pertinent positives and negatives per HPI.  EXAM:  VITALS per patient if applicable: Ht 5\' 4"  (1.626 m)   Wt 281 lb 6.4 oz (127.6 kg)   LMP  (LMP Unknown)   BMI 48.30 kg/m  BP Readings from Last 3 Encounters:  07/25/23 130/76  03/11/23 130/78  02/07/23 132/70   Wt Readings from Last 3 Encounters:  08/15/23 281 lb 6.4 oz (127.6 kg)  07/25/23 281 lb 6.4 oz (127.6 kg)  03/11/23 272 lb 12.8 oz (123.7 kg)      08/15/2023   11:35 AM 08/15/2023   11:23 AM 07/25/2023   11:03 AM  Depression screen PHQ 2/9  Decreased Interest 0 0 0  Down, Depressed, Hopeless 0 0 1  PHQ - 2 Score 0 0 1  Altered sleeping 0 0 1  Tired, decreased energy 0 0 1  Change in appetite 0 0 1  Feeling bad or failure about yourself  0 0 2  Trouble concentrating 0 0 0  Moving slowly or fidgety/restless 0 0 0  Suicidal thoughts 0 0 0  PHQ-9 Score 0 0 6  Difficult doing work/chores Not difficult at all Not difficult at all Somewhat difficult     GENERAL: alert, oriented, appears well and in no acute  distress  HEENT: atraumatic, conjunttiva clear, no obvious abnormalities on inspection of external nose and ears  NECK: normal movements of the head and neck  LUNGS: on inspection no signs of respiratory distress, breathing rate appears normal, no obvious gross SOB, gasping or wheezing  CV: no obvious cyanosis  MS: moves all visible extremities without noticeable abnormality  PSYCH/NEURO: pleasant and cooperative, no obvious depression or anxiety, speech and thought processing grossly intact  ASSESSMENT AND PLAN: Depression, recurrent (HCC) Assessment & Plan: PHQ 0. She is no longer on wellbutrin . Reviewed [email or web address] and discussed heart defect noted in 2 studies during first trimester. We also discussed prioritizing mother's mental health. She doesn't feel that she needs wellbutrin  at this time; I have asked to let me know right away if anything were to change. OB appointment is scheduled.    Hyperlipidemia, unspecified hyperlipidemia type Assessment & Plan: Discussed lifestyle intervention especially as she begins pregnancy. Discussed limiting trans and saturated fats. Will monitor.       -we discussed possible serious and likely etiologies, options for evaluation and workup, limitations of telemedicine visit vs in person visit, treatment, treatment risks and precautions. Pt prefers to treat via telemedicine empirically rather then risking or undertaking an in person visit at this moment.    I discussed the assessment and treatment plan  with the patient. The patient was provided an opportunity to ask questions and all were answered. The patient agreed with the plan and demonstrated an understanding of the instructions.   The patient was advised to call back or seek an in-person evaluation if the symptoms worsen or if the condition fails to improve as anticipated.  Advised if desired AVS can be mailed or viewed via MyChart if Mychart user.   Bascom Bossier, FNP

## 2023-08-15 NOTE — Assessment & Plan Note (Addendum)
 Discussed lifestyle intervention especially as she begins pregnancy. Discussed limiting trans and saturated fats. Will monitor.

## 2023-08-15 NOTE — Patient Instructions (Addendum)
 Bupropion  (Wellbutrin ) - [employer]    Please reference link in regards to wellbutrin  as we discussed.   Congratulations and let me know if you need anything at all.

## 2023-08-15 NOTE — Assessment & Plan Note (Signed)
 PHQ 0. She is no longer on wellbutrin . Reviewed [email or web address] and discussed heart defect noted in 2 studies during first trimester. We also discussed prioritizing mother's mental health. She doesn't feel that she needs wellbutrin  at this time; I have asked to let me know right away if anything were to change. OB appointment is scheduled.

## 2023-09-05 ENCOUNTER — Ambulatory Visit: Admitting: Family

## 2023-09-06 ENCOUNTER — Ambulatory Visit: Admitting: Sleep Medicine

## 2023-09-06 ENCOUNTER — Other Ambulatory Visit (INDEPENDENT_AMBULATORY_CARE_PROVIDER_SITE_OTHER): Payer: Self-pay

## 2023-09-06 ENCOUNTER — Ambulatory Visit: Admitting: Family Medicine

## 2023-09-06 VITALS — BP 110/75 | HR 80 | Wt 287.0 lb

## 2023-09-06 DIAGNOSIS — O3680X0 Pregnancy with inconclusive fetal viability, not applicable or unspecified: Secondary | ICD-10-CM

## 2023-09-06 DIAGNOSIS — Z3A01 Less than 8 weeks gestation of pregnancy: Secondary | ICD-10-CM

## 2023-09-06 DIAGNOSIS — Z3481 Encounter for supervision of other normal pregnancy, first trimester: Secondary | ICD-10-CM

## 2023-09-06 NOTE — Progress Notes (Signed)

## 2023-09-06 NOTE — Progress Notes (Signed)
   Subjective:    Patient ID: Cassandra Vazquez is a 38 y.o. female presenting with OB intake   on 09/06/2023  HPI: Here today for New OB intake. Patient is a [id number] @ [redacted]w[redacted]d by LMP. Has had some spotting with this pregnancy.  Review of Systems  Constitutional:  Negative for chills and fever.  Respiratory:  Negative for shortness of breath.   Cardiovascular:  Negative for chest pain.  Gastrointestinal:  Negative for abdominal pain, nausea and vomiting.  Genitourinary:  Negative for dysuria.  Skin:  Negative for rash.      Objective:    BP 110/75   Pulse 80   Wt 287 lb (130.2 kg)   LMP 06/28/2023   BMI 49.26 kg/m  Physical Exam Exam conducted with a chaperone present.  Constitutional:      General: She is not in acute distress.    Appearance: She is well-developed.  HENT:     Head: Normocephalic and atraumatic.  Eyes:     General: No scleral icterus. Cardiovascular:     Rate and Rhythm: Normal rate.  Pulmonary:     Effort: Pulmonary effort is normal.  Abdominal:     Palpations: Abdomen is soft.  Musculoskeletal:     Cervical back: Neck supple.  Skin:    General: Skin is warm and dry.  Neurological:     Mental Status: She is alert and oriented to person, place, and time.    See u/s report IUGS without yolk sac or embryo, suspicious but not yet definitive for failed pregnancy.     Assessment & Plan:  Encounter to determine fetal viability of pregnancy, single or unspecified fetus - Discussed with pt. Likely to be SAB. Precautions reviewed. Usual reasons, and normal grieving. will rtn in 10 d for f/u u/s - Plan: US  OB Limited   Return in about 10 days (around 09/16/2023) for f/u u/s.  Granville Layer, MD 09/06/2023 10:53 AM

## 2023-09-13 ENCOUNTER — Encounter

## 2023-09-26 ENCOUNTER — Encounter: Payer: Self-pay | Admitting: *Deleted

## 2023-09-28 ENCOUNTER — Encounter: Admitting: Certified Nurse Midwife

## 2023-09-28 ENCOUNTER — Encounter: Payer: Self-pay | Admitting: Family

## 2023-10-11 ENCOUNTER — Other Ambulatory Visit: Payer: Self-pay | Admitting: Family

## 2023-10-11 DIAGNOSIS — R41840 Attention and concentration deficit: Secondary | ICD-10-CM

## 2023-10-11 MED ORDER — BUPROPION HCL ER (XL) 150 MG PO TB24: 150.0000 mg | ORAL_TABLET | Freq: Every morning | ORAL | 3 refills | Status: AC

## 2023-10-31 ENCOUNTER — Emergency Department

## 2023-10-31 ENCOUNTER — Emergency Department
Admission: EM | Admit: 2023-10-31 | Discharge: 2023-10-31 | Disposition: A | Discharge status: Home / Self Care | Attending: Emergency Medicine | Admitting: Emergency Medicine

## 2023-10-31 ENCOUNTER — Other Ambulatory Visit: Payer: Self-pay

## 2023-10-31 DIAGNOSIS — R079 Chest pain, unspecified: Secondary | ICD-10-CM | POA: Diagnosis present

## 2023-10-31 DIAGNOSIS — K2100 Gastro-esophageal reflux disease with esophagitis, without bleeding: Secondary | ICD-10-CM | POA: Insufficient documentation

## 2023-10-31 LAB — CBC
HCT: 31.7 % — ABNORMAL LOW (ref 36.0–46.0)
Hemoglobin: 10 g/dL — ABNORMAL LOW (ref 12.0–15.0)
MCH: 26.2 pg (ref 26.0–34.0)
MCHC: 31.5 g/dL (ref 30.0–36.0)
MCV: 83 fL (ref 80.0–100.0)
Platelets: 479 K/uL — ABNORMAL HIGH (ref 150–400)
RBC: 3.82 MIL/uL — ABNORMAL LOW (ref 3.87–5.11)
RDW: 13.5 % (ref 11.5–15.5)
WBC: 12.8 K/uL — ABNORMAL HIGH (ref 4.0–10.5)
nRBC: 0 % (ref 0.0–0.2)

## 2023-10-31 LAB — BASIC METABOLIC PANEL WITH GFR
Anion gap: 10 (ref 5–15)
BUN: 12 mg/dL (ref 6–20)
CO2: 25 mmol/L (ref 22–32)
Calcium: 9.4 mg/dL (ref 8.9–10.3)
Chloride: 103 mmol/L (ref 98–111)
Creatinine, Ser: 0.63 mg/dL (ref 0.44–1.00)
GFR, Estimated: >60 mL/min (ref >60)
Glucose, Bld: 102 mg/dL — ABNORMAL HIGH (ref 70–99)
Potassium: 3.7 mmol/L (ref 3.5–5.1)
Sodium: 138 mmol/L (ref 135–145)

## 2023-10-31 LAB — HEPATIC FUNCTION PANEL
ALT: 13 U/L (ref 0–44)
AST: 17 U/L (ref 15–41)
Albumin: 3.6 g/dL (ref 3.5–5.0)
Alkaline Phosphatase: 43 U/L (ref 38–126)
Bilirubin, Direct: <0.1 mg/dL (ref 0.0–0.2)
Total Bilirubin: 0.2 mg/dL (ref 0.0–1.2)
Total Protein: 7.5 g/dL (ref 6.5–8.1)

## 2023-10-31 LAB — LIPASE, BLOOD: Lipase: 44 U/L (ref 11–51)

## 2023-10-31 LAB — POC URINE PREG, ED: Preg Test, Ur: NEGATIVE

## 2023-10-31 LAB — TROPONIN I (HIGH SENSITIVITY)
Troponin I (High Sensitivity): 3 ng/L
Troponin I (High Sensitivity): 3 ng/L (ref <18)

## 2023-10-31 MED ORDER — ALUM & MAG HYDROXIDE-SIMETH 200-200-20 MG/5ML PO SUSP
30.0000 mL | Freq: Once | ORAL | Status: AC
  Administered 2023-10-31: 30 mL via ORAL
  Filled 2023-10-31: qty 30

## 2023-10-31 MED ORDER — PANTOPRAZOLE SODIUM 40 MG PO TBEC: 40.0000 mg | DELAYED_RELEASE_TABLET | Freq: Every day | ORAL | 0 refills | Status: AC

## 2023-10-31 MED ORDER — LIDOCAINE VISCOUS HCL 2 % MT SOLN
15.0000 mL | Freq: Once | OROMUCOSAL | Status: AC
  Administered 2023-10-31: 15 mL via ORAL
  Filled 2023-10-31: qty 15

## 2023-10-31 MED ORDER — ONDANSETRON 4 MG PO TBDP
4.0000 mg | ORAL_TABLET | Freq: Once | ORAL | Status: AC
  Administered 2023-10-31: 4 mg via ORAL
  Filled 2023-10-31: qty 1

## 2023-10-31 NOTE — ED Provider Notes (Signed)
 [facility] Provider Note    Event Date/Time   First MD Initiated Contact with Patient 10/31/23 0400     (approximate)   History   Chief Complaint Chest Pain   HPI  Cassandra Vazquez is a 38 y.o. female with past medical history of anemia who presents to the ED complaining of chest pain.  Patient reports that she has had constant dull and achy pain in the center of her chest as well as her epigastrium for about the past hour.  She denies any associated fevers, cough, difficulty breathing, nausea, or vomiting.  She has never had similar symptoms in the past, denies any cardiac history.  She has not had any recent pain or swelling in her legs.  Patient did have a miscarriage about 2 months ago, has not had any abdominal pain or vaginal bleeding since then.     Physical Exam   Triage Vital Signs: ED Triage Vitals  Encounter Vitals Group     BP 10/31/23 0123 (!) 155/108     Girls Systolic BP Percentile --      Girls Diastolic BP Percentile --      Boys Systolic BP Percentile --      Boys Diastolic BP Percentile --      Pulse Rate 10/31/23 0123 72     Resp 10/31/23 0123 16     Temp 10/31/23 0123 98 F (36.7 C)     Temp Source 10/31/23 0123 Oral     SpO2 10/31/23 0123 100 %     Weight --      Height 10/31/23 0124 5' 4 (1.626 m)     Head Circumference --      Peak Flow --      Pain Score 10/31/23 0124 7     Pain Loc --      Pain Education --      Exclude from Growth Chart --     Most recent vital signs: Vitals:   10/31/23 0123 10/31/23 0430  BP: (!) 155/108 (!) 127/91  Pulse: 72 71  Resp: 16 18  Temp: 98 F (36.7 C)   SpO2: 100% 100%    Constitutional: Alert and oriented. Eyes: Conjunctivae are normal. Head: Atraumatic. Nose: No congestion/rhinnorhea. Mouth/Throat: Mucous membranes are moist.  Cardiovascular: Normal rate, regular rhythm. Grossly normal heart sounds.  2+ radial pulses bilaterally. Respiratory: Normal respiratory effort.  No  retractions. Lungs CTAB.  No chest wall tenderness to palpation. Gastrointestinal: Soft and nontender. No distention. Musculoskeletal: No lower extremity tenderness nor edema.  Neurologic:  Normal speech and language. No gross focal neurologic deficits are appreciated.    ED Results / Procedures / Treatments   Labs (all labs ordered are listed, but only abnormal results are displayed) Labs Reviewed  BASIC METABOLIC PANEL WITH GFR - Abnormal; Notable for the following components:      Result Value   Glucose, Bld 102 (*)    All other components within normal limits  CBC - Abnormal; Notable for the following components:   WBC 12.8 (*)    RBC 3.82 (*)    Hemoglobin 10.0 (*)    HCT 31.7 (*)    Platelets 479 (*)    All other components within normal limits  HEPATIC FUNCTION PANEL  LIPASE, BLOOD  POC URINE PREG, ED  TROPONIN I (HIGH SENSITIVITY)  TROPONIN I (HIGH SENSITIVITY)     EKG  ED ECG REPORT I, Carlin Palin, the attending physician, personally viewed and interpreted this  ECG.   Date: 10/31/2023  EKG Time: 1:33  Rate: 71  Rhythm: normal sinus rhythm  Axis: Normal  Intervals:none  ST&T Change: None  RADIOLOGY Chest x-ray reviewed and interpreted by me with no infiltrate, edema, or effusion.  PROCEDURES:  Critical Care performed: No  Procedures   MEDICATIONS ORDERED IN ED: Medications  alum & mag hydroxide-simeth (MAALOX/MYLANTA) 200-200-20 MG/5ML suspension 30 mL (30 mLs Oral Given 10/31/23 0431)    And  lidocaine  (XYLOCAINE ) 2 % viscous mouth solution 15 mL (15 mLs Oral Given 10/31/23 0431)     IMPRESSION / MDM / ASSESSMENT AND PLAN / ED COURSE  I reviewed the triage vital signs and the nursing notes.                              38 y.o. female with past medical history of anemia who presents to the ED complaining of dull aching pain in the center of her chest as well as her epigastrium for the past hour.  Patient's presentation is most consistent  with acute presentation with potential threat to life or bodily function.  Differential diagnosis includes, but is not limited to, ACS, PE, pneumonia, pneumothorax, musculoskeletal pain, GERD, anxiety, pancreatitis, hepatitis, cholecystitis, biliary colic.  Patient nontoxic-appearing and in no acute distress, vital signs are unremarkable.  EKG shows no evidence of arrhythmia or ischemia and initial troponin within normal limits, will check second set but symptoms seem atypical for ACS.  I doubt PE as patient is PERC negative, chest x-ray is unremarkable.  Additional labs without significant anemia, leukocytosis, electrolyte abnormality, or AKI.  We will add on LFTs and lipase but patient has a benign abdominal exam and I doubt biliary pathology.  We will treat with GI cocktail and reassess following additional lab results.  Pregnancy testing is negative, repeat troponin within normal limits, LFTs and lipase are unremarkable.  Patient feeling slightly better following GI cocktail and Zofran , does describe significant feeling of gas in her abdomen and suspect GERD.  She is appropriate for discharge home and we will start her on a PPI.  She was counseled to follow-up with her PCP and return to the ED for new or worsening symptoms, patient agrees with plan.      FINAL CLINICAL IMPRESSION(S) / ED DIAGNOSES   Final diagnoses:  Nonspecific chest pain  Gastroesophageal reflux disease with esophagitis without hemorrhage     Rx / DC Orders   ED Discharge Orders          Ordered    pantoprazole  (PROTONIX ) 40 MG tablet  Daily        10/31/23 0553             Note:  This document was prepared using Dragon voice recognition software and may include unintentional dictation errors.   Willo Dunnings, MD 10/31/23 [phone]

## 2023-10-31 NOTE — ED Notes (Signed)
Patient given discharge instructions including prescriptions x1 and importance of follow up appt as needed with stated understanding. Patient stable and ambulatory with steady even gait on dispo.

## 2023-10-31 NOTE — ED Triage Notes (Addendum)
 Pt to ed from home via POV for CP x 30 mins. Pt took an advil  at home with no relief. Pt is caox4, in no acute distress and ambulatory in triage.  Pt advised movement makes it worse.

## 2023-11-08 ENCOUNTER — Ambulatory Visit: Payer: Self-pay

## 2023-11-08 DIAGNOSIS — Z30011 Encounter for initial prescription of contraceptive pills: Secondary | ICD-10-CM

## 2023-11-08 DIAGNOSIS — B9689 Other specified bacterial agents as the cause of diseases classified elsewhere: Secondary | ICD-10-CM

## 2023-11-08 DIAGNOSIS — Z113 Encounter for screening for infections with a predominantly sexual mode of transmission: Secondary | ICD-10-CM

## 2023-11-08 LAB — WET PREP FOR TRICH, YEAST, CLUE
Clue Cell Exam: POSITIVE — AB
Trichomonas Exam: NEGATIVE
Yeast Exam: NEGATIVE

## 2023-11-08 LAB — HM HIV SCREENING LAB: HM HIV Screening: NEGATIVE

## 2023-11-08 MED ORDER — METRONIDAZOLE 500 MG PO TABS: 500.0000 mg | ORAL_TABLET | Freq: Two times a day (BID) | ORAL | Status: AC

## 2023-11-08 MED ORDER — NORETHINDRONE 0.35 MG PO TABS: 1.0000 | ORAL_TABLET | Freq: Every day | ORAL | 12 refills | Status: AC

## 2023-11-08 NOTE — Progress Notes (Signed)
 Pt here for STI screening.  Wet mount results reviewed.  Results positive for BV.  Metronidazole  500mg  #14 dispensed to patient.  Counseled re medication, side effects, plan of care and when to contact clinic with questions or concerns.  Verbalizes understanding.  Counseled to schedule physical and follow up for birth control in 3mos.-Lissie Hinesley, RN

## 2023-11-08 NOTE — Addendum Note (Signed)
 Addended by: ROSABEL DAMIEN BRAVO on: 11/08/2023 04:30 PM   Modules accepted: Orders

## 2023-11-08 NOTE — Progress Notes (Addendum)
 [facility] Department STI clinic [address], [address] [place] [address] Main phone: [phone]  STI screening visit  Subjective:  Cassandra Vazquez is a 38 y.o. female being seen today for an STI screening visit. The patient reports they do not have symptoms.  Patient reports that they do not desire a pregnancy in the next year.   They reported they are interested in discussing contraception today. She is interested in oral contraception. Last physical exam was at [facility] on 05/07/22.   Patient's last menstrual period was 10/12/2023 (approximate).  Patient has the following medical conditions:  Patient Active Problem List   Diagnosis Date Noted   HLD (hyperlipidemia) 08/15/2023   Epigastric pain 07/25/2023   Vaginitis 02/07/2023   Cervical cancer screening 01/06/2023   B12 deficiency 03/27/2021   Sinusitis 07/08/2020   Elevated WBC count 12/17/2019   Birth control counseling 11/02/2019   Vitamin D  deficiency 02/22/2018   Depression, recurrent (HCC) 09/02/2017   Fatigue 09/02/2017   Routine physical examination 11/08/2016   Obesity 10/21/2016   Chief Complaint  Patient presents with   SEXUALLY TRANSMITTED DISEASE   HPI Patient reports desire for asymptomatic STI testing today. No symptoms today, but does sometimes have bothersome discharge. She has been sexually active and is interested in contraception. After discussion, she reports she would like to try progesterone only pills. No firm contraindications for estrogen - she does not smoke, no hx of breast cancer, no history of blood clots. Some high blood pressure readings at various times but no HTN diagnosis. After discussion combined pills versus progesterone only she had a personal preference for progesterone only.  Does the patient using douching products? No  See flowsheet for further details and programmatic requirements Hyperlink available at the top of the signed note in blue.  Flow sheet  content below:  Pregnancy Intention Screening Does the patient want to become pregnant in the next year?: No Does the patient's partner want to become pregnant in the next year?: No Would the patient like to discuss contraceptive options today?: No Reason For STD Screen STD Screening: Is asymptomatic Have you ever had an STD?: Yes History of Antibiotic use in the past 2 weeks?: No STD Symptoms Denies all: Yes Risk Factors for Hep B Household, sexual, or needle sharing contact of a person infected with Hep B: No Sexual contact with a person who uses drugs not as prescribed?: No Currently or Ever used drugs not as prescribed: No HIV Positive: No PRep Patient: No Men who have sex with men: No Have Hepatitis C: No History of Incarceration: No History of Homeslessness?: No Anal sex following anal drug use?: No Risk Factors for Hep C Currently using drugs not as prescribed: No Sexual partner(s) currently using drugs as not prescribed: No History of drug use: No HIV Positive: No People with a history of incarceration: No People born between the years of 70 and 69: No Abuse History Has patient ever been abused physically?: No Has patient ever been abused sexually?: No Does patient feel they have a problem with Anxiety?: Yes Does patient feel they have a problem with Depression?: Yes Referral to Behavioral Health: Declined (has therapist) Counseling Patient counseled to use condoms with all sex: Condoms declined RTC in 2-3 weeks for test results: Yes Clinic will call if test results abnormal before test result appt.: Yes Test results given to patient Patient counseled to use condoms with all sex: Condoms declined   Screening for MPX risk: Does  the patient have an unexplained rash? No Is the patient MSM? No Does the patient endorse multiple sex partners or anonymous sex partners? No Did the patient have close or sexual contact with a person diagnosed with MPX? No Has the  patient traveled outside the [place]  where MPX is endemic? No Is there a high clinical suspicion for MPX-- evidenced by one of the following No  -Unlikely to be chickenpox  -Lymphadenopathy  -Rash that present in same phase of evolution on any given body part  Screenings: Last HIV test per patient/review of record was  Lab Results  Component Value Date   HMHIVSCREEN Negative - Validated 04/12/2023    Lab Results  Component Value Date   HIV NON-REACTIVE 07/25/2023    Last HEPC test per patient/review of record was No results found for: HMHEPCSCREEN No components found for: HEPC   Last HEPB test per patient/review of record was No components found for: HMHEPBSCREEN   Patient reports last pap was:   No results found for: SPECADGYN Result Date Procedure Results Follow-ups  01/06/2023 Cytology - PAP High risk HPV: Negative Adequacy: Satisfactory for evaluation; transformation zone component PRESENT. Diagnosis: - Negative for intraepithelial lesion or malignancy (NILM) Comment: Normal Reference Range HPV - Negative   05/07/2022 Cytology - PAP( [place]) High risk HPV: Negative Neisseria Gonorrhea: Negative Chlamydia: Negative Trichomonas: Negative Adequacy: Satisfactory for evaluation; transformation zone component PRESENT. Diagnosis: - Negative for intraepithelial lesion or malignancy (NILM) Comment: Normal Reference Range HPV - Negative Comment: Normal Reference Range Trichomonas - Negative Comment: Normal Reference [place] Chlamydia - Negative Comment: Normal Reference Range Neisseria Gonorrhea - Negative   11/02/2019 Cytology - PAP High risk HPV: Negative Adequacy: Satisfactory for evaluation; transformation zone component PRESENT. Diagnosis: - Negative for intraepithelial lesion or malignancy (NILM) Comment: Normal Reference Range HPV - Negative   05/19/2016 Cytology - PAP Pap: Negative for intraephithelial lesion or malignancy    Immunization history:  Immunization History   Administered Date(s) Administered   Moderna Sars-Covid-2 Vaccination 06/11/2019, 07/09/2019   PPD Test 11/08/2016   Tdap 11/04/2016    The following portions of the patient's history were reviewed and updated as appropriate: allergies, current medications, past medical history, past social history, past surgical history and problem list.  Objective:  There were no vitals filed for this visit.  Physical Exam Constitutional:      Appearance: Normal appearance.  HENT:     Head: Normocephalic.     Mouth/Throat:     Mouth: Mucous membranes are moist.     Pharynx: Oropharynx is clear. No pharyngeal swelling, oropharyngeal exudate or posterior oropharyngeal erythema.  Eyes:     General: No scleral icterus.       Right eye: No discharge.        Left eye: No discharge.  Pulmonary:     Effort: Pulmonary effort is normal.  Lymphadenopathy:     Cervical: No cervical adenopathy.  Skin:    General: Skin is warm and dry.  Neurological:     Mental Status: She is alert.  Psychiatric:        Mood and Affect: Mood normal.        Behavior: Behavior normal.    Assessment and Plan:  Onie Kasparek is a 38 y.o. female presenting to the [facility] Department for STI screening  1. Screening for venereal disease (Primary)  - WET PREP FOR TRICH, YEAST, CLUE - Chlamydia/Gonorrhea [facility] - [facility] - Syphilis Serology, [place]  Lab  2. Encounter for initial prescription of contraceptive pills  - Menstrual period due around 11/12/23, will start her OCPs then - Counseled on need to take pill at precisely the same time daily - norethindrone  (MICRONOR ) 0.35 MG tablet; Take 1 tablet (0.35 mg total) by mouth daily.  Dispense: 28 tablet; Refill: 12  - Follow up for a PE and follow up in 3 months  3. Bacterial vaginosis  - Positive clue and amine. pH not done because she reported asymptomatic today, self swabbed. Discharge sometimes but not daily. Discussed results with  patient and patient preferred to treat today. - metroNIDAZOLE  (FLAGYL ) 500 MG tablet; Take 1 tablet (500 mg total) by mouth 2 (two) times daily for 7 days.   Patient accepted the following screenings: vaginal CT/GC swab, vaginal wet prep, HIV, and RPR Patient meets criteria for HepB screening? No. Ordered? no Patient meets criteria for HepC screening? No. Ordered? no  Treat wet prep per standing order Discussed time line for [facility] results and that patient will be called with positive results and encouraged patient to call if she had not heard in 2 weeks.  Counseled to return or seek care for continued or worsening symptoms Recommended repeat testing in 3 months with positive results. Recommended condom use with all sex for STI prevention.   Patient is currently using nothing to prevent pregnancy.    Return in about 3 months (around 02/08/2024) for physical exam and follow up on OCPs.  Future Appointments  Date Time Provider Department Center  12/21/2023  5:00 PM Sharron Frederic DASEN, PsyD [facility] None    Damien FORBES Satchel, NP

## 2023-11-30 ENCOUNTER — Encounter

## 2023-12-07 ENCOUNTER — Encounter

## 2023-12-21 ENCOUNTER — Ambulatory Visit (INDEPENDENT_AMBULATORY_CARE_PROVIDER_SITE_OTHER): Admitting: Psychology

## 2023-12-21 DIAGNOSIS — F89 Unspecified disorder of psychological development: Secondary | ICD-10-CM

## 2023-12-21 NOTE — Progress Notes (Addendum)
 Date: 12/21/2023 Appointment Start Time: 5:02pm (this Clinical research associate experienced tech issues that resulted in him briefly being disconnected from the appointment at 6:05pm) Duration: 110 minutes Provider: Frederic Fire, PsyD Type of Session: Initial Appointment for Evaluation  Location of Patient: Home Location of Provider: Provider's Home (private office) Type of Contact: Caregility video visit with audio  Session Content:  Prior to proceeding with today's appointment, two pieces of identifying information were obtained from [place] to verify identity. In addition, Korey's physical location at the time of this appointment was obtained. In the event of technical difficulties, Sevanna shared a phone number she could be reached at. Charidy and this provider participated in today's telepsychological service. Amiylah denied anyone else being present in the room or on the virtual appointment.  The provider's role was explained to [place]. The provider reviewed and discussed issues of confidentiality, privacy, and limits therein (e.g., reporting obligations). In addition to verbal informed consent, written informed consent for psychological services was obtained from Grants prior to the initial appointment. Written consent included information concerning the practice, financial arrangements, and confidentiality and patients' rights. Since the clinic is not a 24/7 crisis center, mental health emergency resources were shared, and the provider explained e-mail, voicemail, and/or other messaging systems should be utilized only for non-emergency reasons. This provider also explained that information obtained during appointments will be placed in their electronic medical record in a confidential manner. Katlin verbally acknowledged understanding of the aforementioned and agreed to use mental health emergency resources discussed if needed. Moreover, Reanne agreed information may be shared with other [facility] or their referring  provider(s) as needed for coordination of care. By signing the new patient documents, Regena provided written consent for coordination of care. Ereka verbally acknowledged understanding she is ultimately responsible for understanding her insurance benefits as it relates to reimbursement of telepsychological and in-person services. This provider also reviewed confidentiality, as it relates to telepsychological services, as well as the rationale for telepsychological services. This provider further explained that video should not be captured, photos should not be taken, nor should testing stimuli be copied or recorded as it would be a copyright violation. Dejai expressed understanding of the aforementioned, and verbally consented to proceed. Donna is aware of the limitations of teleheath visits and verbally consented to proceed.   Sarina completed the neurobehavioral examination, which included obtaining a, family, social, and psychiatric history; integration of prior history and other sources of clinical data to assist with clinical decision making; behavioral observations; assessment of thinking, reasoning, and judgment; and establishment of a provisional diagnosis. The evaluation was completed in 110 minutes. Codes [id number] and [id number] were billed.   Mental Status Examination:  Appearance:  neat Behavior: appropriate to circumstances Mood: anxious Affect: mood congruent Speech: pressured and tangential  Eye Contact: intermittent Psychomotor Activity: restless Thought Process: denies suicidal, homicidal, and self-harm ideation, plan and intent Content/Perceptual Disturbances: none Orientation: AAOx4 Cognition/Sensorium: intact Insight: good Judgment: good  Provisional DSM-5 diagnosis(es):  F89 Unspecified Disorder of Psychological Development   Plan: Testing is expected to answer the question, does the individual meet criteria for ADHD when age, other mental health concerns (e.g., anxiety,  depression, and autism spectrum disorder), and cognitive functioning are taken into consideration? Further testing is warranted because a diagnosis cannot be given solely based on current interview data (further data is required). Testing results are expected to answer the remaining diagnostic questions in order to provide an accurate diagnosis and assist in treatment planning with an expectation of improved clinical  outcome. Najai is currently scheduled for an appointment on 12/28/2023 at 3:30pm via Caregility video visit with audio.      CONFIDENTIAL PSYCHOLOGICAL EVALUATION[TB1]  ______________________________________________________________________________ Name: Ms. Kailin Principato Date of Birth: 1985/05/03                                                        Age: 47   SOURCE AND REASON FOR REFERRAL: Ms. Santosha Jividen was referred by Rollene Northern, NP-C, for an evaluation to ascertain if she meets the criteria for Attention Deficit/Hyperactivity Disorder (ADHD).                                                                                                    BACKGROUND INFORMATION AND PRESENTING PROBLEM: Ms. Siah Kannan is a 38 year old female who resides in [place] .   Ms. Chad reported she was diagnosed with ADHD when in the "second or third grade," although her "parents were more on the holistic side" and did not pursue medication. She endorsed the following ADHD-related symptoms:   Symptoms Frequency     Often Occasionally Infrequent/No Significant Issues  Inattention Criterion (DSM-5-TR)     Making careless mistakes and missing small details. She reported this regularly happens on tasks she does not enjoy or find stimulating, and that forgetfulness and inattention contributes.    Being easily distracted by various stimuli and the mind often being elsewhere even when no apparent distractions exist. She described being easily distracted by various stimuli (e.g., sounds, movement, and  irrelevant tasks and thoughts), and that her mind is often elsewhere even when no obvious distractions are present.    Trouble sustaining attention during conversations. She stated this happens "all the time," which she attributed to becoming distracted by irrelevant thoughts.    Does not follow through on instructions and fails to finish tasks. She endorsed commonly experiencing task initiation, maintenance, and completion issues.    Difficulty organizing tasks and activities. She discussed frequently having clutter in her home, being prone to haphazard completion of tasks, a tendency to complete tasks last minute, and having trouble sticking to plans as she experiences decreased desire to do so once the time arrives.    Avoids, dislikes, or is reluctant to engage in tasks that require sustained mental effort. She described herself as the "procrastination queen," and that she "does [her] best work" when "it is a crisis," indicating that urgency and concerns about negative repercussions are primary motivators to starting tasks she has put off.    Loses things necessary for tasks or activities. She discussed being prone to forgetting needed items for tasks.    Forgetful in daily activities. She also discussed being prone to forgetting tasks and what she wanted to say.    Hyperactivity and Impulsivity Criterion (DSM-5-TR)     Fidgets with hands or feet or squirms in seat. She stated she uses "lot[s] of hand movements" while speaking, and  habitually and largely unconsciously fidgets.    Leaves seat in situations in which remaining seated is expected.   She endorsed having trouble staying still but denied having significantly difficulty with staying seated.  Feelings of restlessness. She described struggling when she "ha[s] nothing to do," and that she "always ha[s] to be doing something," which contributes to difficulty relaxing and sleep onset.    Difficulty playing or engaging in leisure activities  quietly. She reported commonly making comments during leisure activities (e.g., making comments during movies), which others have commented on.    "On the go" or often acts as if "driven by a motor." She endorsed often feeling she has to be moving or doing something, and that she experiences emotional distress when there is "nothing to do."    Talks excessively. She stated she is prone to excessive talking, which she attributed to a tendency to switch topics while talking. She further stated how this symptom contributes to her "staying quiet" or "sharing very little" in an effort to prevent it from occurring.    Interrupts others. She shared that she commonly has urges and "really really wants to say" while someone is talking, and that she tends to interrupt others when they are busy. She noted how it takes significant effort not to interrupt others.    Impatience. She discussed being impatient, and how when she is required to wait or if someone or something appears to be moving slower than necessary can cause significant agitation. She also discussed how this causes her to engage in efforts to avoid lines, and how it contributes to her interrupting of others.    ADHD-Related Symptoms that Assist in Identifying ADHD but are not in the DSM-5-TR Jeanna, 2021, p. 6-12 and [phone])     Emotional dysregulation and overstimulation. She described herself as "reactive," irritable, impatient, and prone to feeling overwhelmed. She further described how difficulties sustaining her attention, and having her attention broken from a task contribute to feeling overwhelmed and that she can become irritable if others are perceived as moving slower than needed.    Making decisions impulsively. She discussed often saying and purchasing things she later regrets, noting how it has led to relational and financial strains. She further discussed how it contributes to her feeling socially "awkward."    Tending to drive much faster  than others. She stated she has received "many many speeding tickets," and that she can be prone to driving 15 or more miles per hour over the speed limit if she believes  law enforcement is not around.    Trouble following through on promises or commitments made to others. She said this symptom commonly occurs, and noted how she is prone to having significantly reduced drive to follow through on a plan when the time comes to do so.    Trouble doing things in proper order or sequence. She described it as "frustrating" to have to "keep looking at instructions," which contributes to her going out of order on tasks and having trouble completing tasks that required a specific order.    Ms. Lewis discussed a history of having possibly been diagnosed with "mild depression," noting how she had "a lot of life going on" at the time (e.g., a divorce), as well as "constant" low mood that she attributed to ADHD- and social-related difficulties; generalized anxiety (e.g., stating she has an "irrational fear" of being involved in a motor vehicle accident or of "dying), noting this anxiety seems to have occurred after  she had children and that it may stem from concerns about their wellbeing if "something [was to] happen to [her];" social anxiety-related (e.g., regularly having concerns about her social functioning, "replaying conversations in [her] head," and experiencing emotional discomfort in "intimate setting") that contributes to fatigue, efforts to mask her discomfort and various social difficulties, and avoiding "intimate" social settings; She also discussed a history of autism spectrum disorder-related symptomatology that she described as including difficulty with "emotional closeness" and "emotionally intimate" conversations, adding that it "seems exhausting" and hard to "understand" emotional intimacy" and that her mind "automatically goes to problem solv[ing];" describing herself as "acting the whole time" and  engaging in efforts to mask various ADHD- and autism-related difficulties when in social settings, which contributes to fatigue; engaging in significant efforts to make sure things are a certain way (e.g., stating her "[clothing] closet is chromatic colors"); sensory-related concerns (e.g., having a strong dislike of being hugged, having a strong like of specific colors, and a possible reduced sensitivity to pain); and significant discomfort with "eye contact;" and others indicating she is being "insensitive" when she is not intentionally being insensitive but is just "being [herself]." Ms. Marini expressed a belief that her ADHD-related symptoms have occurred since childhood, and are consistent and independent of mood. She further described how she engages in various compensatory strategies to assist in reducing the effects of various ADHD-related symptoms, but that it takes significant effort for her to do so and she often "feel[s] [she is] not being [herself]."   Ms. Chad denied awareness of having ever experienced any developmental milestone delays, grade retention, learning disability diagnosis, or having an individualized education plan. She discussed throughout schooling she engaged in a "lot of talking [during class]," tended to be inattentive during class, was impulsive (e.g., "breaking a teacher's glasses" for seemingly no reason, as well as "spraying a fire extinguisher to see what was in it," which led to her being expelled), and that in high school her behaviors "leveled out" but she was on the "more promiscuous side." She indicated the behaviors mentioned above regularly occurred secondary to her being prone to boredom and a desire for stimulation. She reported having natural academic strengths and that she was in the "top ten or five percent" of her class. She stated that she is currently in graduate school, and that her natural academic abilities have been helpful in her achieving "all As," but that  "once [she] get[s] it," it becomes harder for her to stay engaged with the class material. She further stated that she is prone to forgetting class material after a test and the class has ended, and struggles with "research" and "discussions," which she attributed to these tasks not having a sense of urgency and/or clear deadlines throughout. 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 She stated she is currently divorced secondary to "infidelity," "communication issues," and ADHD-related difficulties she indicated both of them experience (e.g., inattention, forgetfulness, and hyperactivity).    Ms. Urquidi reported her medical history is unremarkable. She denied awareness of having ever experienced seizures or head injury. She reported past use of psychotherapeutic services, although noted how she "reschedule[d] quite a bit," and that she currently utilizes Wellbutrin  for a reason she was unable to remember. She described Wellbutrin  as being limited in its efficacy but stated that "sometimes" there are "slight moments" "right when [she] take[s] it" where it seems to help with sustaining  attention. She denied ever experiencing psychiatric hospitalization or meeting the full criteria for a hypomanic or manic episode. She stated her familial mental health history is significant for "bipolarism" (father); "mild schizophrenia," childhood diagnosis of ADHD, and a past psychiatric hospitalization (brother); various "men" on her mother's side have "autism;" and possible ADHD as she is "energetic," "all over the place," prone to interrupting others and excessive talking (mother).           Frederic ONEIDA Fire, PsyD

## 2023-12-28 ENCOUNTER — Ambulatory Visit (INDEPENDENT_AMBULATORY_CARE_PROVIDER_SITE_OTHER): Admitting: Psychology

## 2023-12-28 DIAGNOSIS — F89 Unspecified disorder of psychological development: Secondary | ICD-10-CM | POA: Diagnosis not present

## 2023-12-28 NOTE — Progress Notes (Addendum)
 Date: 12/28/2023   Appointment Start Time: 3:30-3:55pm (psychodiagnostic intv) and 3:55-4:30pm (WAIS-5 administration) Duration: 60 total minutes Provider: Frederic Fire, PsyD Type of Session: Testing Appointment for Evaluation  Location of Patient: Home Location of Provider: Provider's Home (private office) Type of Contact: Caregility video visit with audio  Session Content: Today's appointment was a telepsychological visit. Cassandra Vazquez is aware it is her responsibility to secure confidentiality on her end of the session. Prior to proceeding with today's appointment, Antrice's physical location at the time of this appointment was obtained as well a phone number she could be reached at in the event of technical difficulties. Jennika denied anyone else being present in the room or on the virtual appointment. This provider reviewed that video should not be captured, photos should not be taken, nor should testing stimuli be copied or recorded as it would be a copyright violation. Marthann expressed understanding of the aforementioned, and verbally consented to proceed. The WAIS-5 was administered, scored, and interpreted by this evaluator. Geral is aware of the limitations of teleheath visits and verbally consented to proceed.  Rochella completed the psychiatric diagnostic evaluation (history, including past, family, social, and  psychiatric history; behavioral observations; and establishment of a provisional diagnosis). The evaluation was completed in 25 minutes. Code 09208 was billed. Please the evaluation below for additional information.   The test administration, scoring, interpreting, and report writing billing codes will be input on the feedback appointment.   Provisional DSM-5 diagnosis(es):  F89 Unspecified Disorder of Psychological Development   Plan: Delbra was scheduled for a feedback appointment on 01/05/2024 at 4:30pm via Caregility video visit with audio.      CONFIDENTIAL PSYCHOLOGICAL  EVALUATION ______________________________________________________________________________ Name: [name] Date of Birth: [birth year]    Age: 38  SOURCE AND REASON FOR REFERRAL: [name] was referred by Cassandra Northern, NP-C, for an evaluation to ascertain if she meets the criteria for Attention Deficit/Hyperactivity Disorder (ADHD).    BACKGROUND INFORMATION AND PRESENTING PROBLEM: [name] is a 38 year old female who resides in [place] .  Ms. Chad reported she was diagnosed with ADHD when in the "second or third grade," although she added her "parents were more on the holistic side," so they did not pursue medication management of her symptoms. She endorsed the following ADHD-related symptoms:  Symptoms Frequency   Often Occasionally Infrequent/No Significant Issues  Inattention Criterion (DSM-5-TR)     Making careless mistakes and missing small details.  She reported that this regularly happens on tasks she does not enjoy or find stimulating, and that forgetfulness and inattention contribute to it.     Being easily distracted by various stimuli and the mind often being elsewhere, even when no apparent distractions exist.  She described being easily distracted by various stimuli (e.g., sounds, movement, and irrelevant tasks and thoughts), and that her mind is often elsewhere even when no obvious distractions are present.    Trouble sustaining attention during conversations.  She stated this happens "all the time," attributing it to becoming distracted by irrelevant thoughts.      Does not follow through on instructions and fails to finish tasks.  She endorsed commonly experiencing task initiation, maintenance, and completion issues.     Difficulty organizing tasks and activities.  She frequently discussed having clutter in her home, being prone to haphazard task completion, finishing tasks at the last minute, and having trouble sticking to plans because she loses the  desire once the time arrives.     Avoids, dislikes, or  is reluctant to engage in tasks that require sustained mental effort. She described herself as the "procrastination queen," and that she "does [her] best work" when "it is a crisis," indicating that urgency and concerns about negative repercussions are primary motivators to starting tasks she has put off.     Loses things necessary for tasks or activities.  She discussed being prone to forgetting needed items for tasks.     Forgetful in daily activities.  She also discussed a tendency to forget tasks and what she wanted to say.     Hyperactivity and Impulsivity Criterion (DSM-5-TR)     Fidgets with hands or feet or squirms in seat. She stated she uses "lot[s] of hand movements" while speaking, as well as habitually, and largely unconsciously, fidgets.     Leaves seat in situations in which remaining seated is expected.   She endorsed having trouble staying still while seated, but denied having significant difficulty with staying seated.  Feelings of restlessness. She described struggling when she "ha[s] nothing to do," and that she "always ha[s] to be doing something," which contributes to difficulty relaxing and sleep onset.      Difficulty playing or engaging in leisure activities quietly. She reported commonly making comments during leisure activities (e.g., during movies), which others have also commented on.     "On the go" or often acts as if "driven by a motor." She stated she often feels she must be moving or doing something, and that she experiences emotional distress when there is "nothing to do."     Talks excessively.  She stated she is prone to excessive talking, attributing it to a tendency to switch topics while speaking. She further stated how this symptom contributes to her "staying quiet" or "sharing very little" to prevent it from occurring.      Interrupts others.  She shared that she commonly has urges to share something she  "really, really wants to say" while someone is talking, which contributes to a tendency to interrupt others. She noted how it takes significant effort not to act on these urges.      Impatience.  She discussed being impatient, sharing how when she is required to wait and/or when someone or something seems to be moving slower than necessary during a task, it can cause significant agitation. She also discussed how this causes her to engage in efforts to avoid lines and how it contributes to her interrupting others.      ADHD-Related Symptoms that Assist in Identifying ADHD but are not in the DSM-5-TR Jeanna, 2021, p. 6-12 and [phone])     Emotional dysregulation and overstimulation. She described herself as "reactive," irritable, impatient, and prone to feeling overwhelmed. She further explained how difficulties in sustaining her attention and being interrupted during a task contribute to feeling overwhelmed, and that she can become irritable if others are perceived as moving more slowly than needed.     Making decisions impulsively. She discussed often saying and purchasing things she later regrets, noting how it has led to relational and financial strains. She further discussed how it contributes to her feeling socially "awkward."    Tending to drive much faster than others. She stated she has received "many, many speeding tickets," and that she can be prone to driving 15 or more miles per hour over the speed limit if she believes law enforcement is not around.     Trouble following through on promises or commitments made to others.  She said  this symptom commonly occurs and noted how she is prone to having significantly reduced drive to follow through on a plan when the time comes to do so.     Trouble doing things in proper order or sequence. She described it as "frustrating" to have to "keep looking at instructions," which leads her to skip tasks and have trouble completing those that require a specific  order.      Ms. Silvestri discussed a history of having possibly been diagnosed with "mild depression," noting how she had "a lot of life going on" at the time (e.g., a divorce), as well as how she has a "constant" low mood that she attributed to ADHD- and social-related difficulties; generalized anxiety (e.g., stating she has an "irrational fear" of being involved in a motor vehicle accident or of "dying), noting this anxiety seems to have occurred after she had children and that it may stem from concerns about her children's wellbeing if "something [was to] happen to [her];" social anxiety-related symptoms (e.g., regularly having concerns about her social functioning, "replaying conversations in [her] head," and experiencing emotional discomfort in an "intimate [social] setting"), and how these symptoms contribute to fatigue, efforts to hide her discomfort and various social difficulties, and avoiding "intimate" social settings; and obsessions and compulsions (e.g., liking things arranged in just a certain way, which can contribute to task initiation issues). She also discussed a history of autism spectrum disorder-related symptomatology that she described as including difficulty with "emotional closeness" and "emotionally intimate" conversations, adding that it "seems exhausting" and hard to "understand emotional intimacy," and that her mind "automatically goes to problem solv[ing]" when others are sharing emotionally difficult topics; describing herself as "acting the whole time" and engaging in efforts to mask various ADHD- and autism-related difficulties when in social settings, which contributes to fatigue; engaging in significant efforts to make sure things are a certain way (e.g., stating her "[clothing] closet is chromatic colors"); sensory-related concerns (e.g., having a strong dislike of being hugged and of specific colors, as well as a possible reduced sensitivity to pain); significant discomfort with  "eye contact;" and others indicating she is being "insensitive" when she is not intentionally being insensitive but is "being [herself]." Ms. Kawashima expressed a belief that her ADHD-related symptoms have occurred since childhood and are consistent and independent of mood. She further described how she engages in various compensatory strategies to assist in reducing the effects of various ADHD-related symptoms, but that it takes significant effort for her to do so and she often "feel[s] [she is] not being [herself]."  Ms. Chad denied awareness of having ever experienced any developmental milestone delays, grade retention, learning disability diagnosis, or having an individualized education plan. She discussed throughout schooling she engaged in a "lot of talking [during class]," tended to be inattentive during class, was impulsive (e.g., "breaking a teacher's glasses" for seemingly no reason, as well as "spraying a fire extinguisher to see what was in it," which led to her being expelled), and that in high school her behaviors "leveled out" but she was on the "more promiscuous side." She indicated that the behaviors mentioned above regularly occurred because she was prone to boredom and had a desire for stimulation. She reported having natural academic strengths and being in the "top ten or five percent" of her class. She stated that she is currently in graduate school, and that her natural academic abilities have been helpful in her achieving "all As," but that "once [she] gets [a class topic]," it  becomes harder for her to stay engaged with the class material. She further stated that she is prone to forgetting class material after a test and when the class has ended. She also stated she struggles with "research" and "discussions," attributing this to the lack of urgency and clear deadlines in these tasks. 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 She stated she is currently divorced secondary to "infidelity," "communication issues," and ADHD-related difficulties (e.g., inattention, forgetfulness, and hyperactivity)  that both of them experience.   Ms. Corriveau reported her medical history is unremarkable. She denied awareness of having ever experienced seizures or a head injury. She reported past use of psychotherapeutic services, although noted how she "reschedule[d] quite a bit," and that she currently utilizes Wellbutrin  for a reason she was unable to remember. She described Wellbutrin  as being limited in its efficacy but stated that "sometimes" there are "slight moments" "right when [she] take[s] it" where it seems to help with sustaining attention. She denied ever experiencing psychiatric hospitalization or meeting the full criteria for a hypomanic or manic episode. She stated her familial mental health history is significant for "bipolarism" (father); "mild schizophrenia," childhood diagnosis of ADHD, and a past psychiatric hospitalization (brother); various "men" on her mother's side have "autism;" and possible ADHD as she is "energetic," "all over the place," prone to interrupting others, and engaging in excessive talking (mother).  Chart Review: Per a 10/11/2023 MyChart message to Cassandra Northern, FNP, Ms. Chad stated, "At our last visit, we had spoke about me possibly getting on Wellbutrin  or trying something different. Over the past few months I have been having some difficulty with my ADHD. Is there anything I can take to help with that?" Ms. Arnett responded, "We can retrial wellbutrin  off label for concentration as long as no history of seizure, anorexia, bulimia. You may also not drink alcohol on wellbutrin . Do you have formal diagnosis of ADHD or would you like for to make that referral? Likely stimulant medication such as adderall would be more effective. I cannot prescribe without formal diagnosis. Would you like to start  wellbutrin  in the interim? We can start with wellbutrin  150mg  daily and increase to 300mg  after a week. You would need to monitor for increased anxiety, irritability." Ms. Swinger responded to this message saying, "My formal diagnosis is from childhood so I will need a referral. I can restart the wellbutrin  in the meantime," which led to Ms. Arnett referring her for an ADHD evaluation.                   Frederic ONEIDA Fire, PsyD

## 2024-01-05 ENCOUNTER — Ambulatory Visit: Admitting: Psychology

## 2024-01-05 DIAGNOSIS — F902 Attention-deficit hyperactivity disorder, combined type: Secondary | ICD-10-CM | POA: Diagnosis not present

## 2024-01-05 NOTE — Progress Notes (Signed)
 Testing and Report Writing Information: The following measures  were administered, scored, and interpreted by this provider:  Generalized Anxiety Disorder-7 (GAD-7; 5 minutes), Patient Health Questionnaire-9 (PHQ-9; 5 minutes), Wechsler Adult Intelligence Scale-Fourth Edition (WAIS-5; 70 minutes), CNS Vital Signs (45 minutes), Adult Attention Deficit/Hyperactivity Disorder Self-Report Scale Checklist (ASRSv1.1; 15 minutes), Behavior Rating Inventory for Executive Function - 2A - Self Report (BRIEF 2A; 10 minutes) and Behavior Rating Inventory for Executive Function - 2A - Informant  (BRIEF-2A; 10 minutes), Adult OCD Inventory (OCD-A) SF-20 (15 minutes), Social Responsiveness Scale-2 (SRS-2; 15 minutes), and Personality Assessment Inventory (PAI; 50 minutes). A total of 240 minutes was spent on the administration and scoring of the aforementioned measures. Codes 03863 and [phone] (7 units) were billed.  Please see the assessment for additional details. This provider completed the written report which includes integration of patient data, interpretation of standardized test results, interpretation of clinical data, review of information provided by Rosina and any collateral information/documentation, and clinical decision making (335 minutes in total).  Feedback Appointment: Date: 01/05/2024 Appointment Start Time: 4:30pm Duration: 58 minutes Provider: Frederic Fire, PsyD Type of Session: Feedback Appointment for Evaluation  Location of Patient: Home Location of Provider: Provider's Home (private office) Type of Contact: Caregility video visit with audio  Session Content: Today's appointment was a telepsychological visit due to COVID-19. Jozy is aware it is her responsibility to secure confidentiality on her end of the session. She provided verbal consent to proceed with today's appointment. Prior to proceeding with today's appointment, Maggie's physical location at the time of this appointment was  obtained as well a phone number she could be reached at in the event of technical difficulties. Dyneisha denied anyone else being present in the room or on the virtual appointment. Addilyn is aware of the limitations of teleheath visits and verbally consented to proceed.  This provider and Rosina completed the interactive feedback session which includes reviewing the aforementioned measures, treatment recommendations, and diagnostic conclusions.   The interactive feedback session was completed today and a total of 58 minutes was spent on feedback. Code 03867 was billed for feedback session.   DSM-5 Diagnosis(es):  F90.2 Attention-Deficit/Hyperactivity Disorder, Combined Presentation, Moderate   Time Requirements: Assessment scoring and interpreting: 240 minutes (billing code 03863 and [phone] [7 units]) Feedback: 58 minutes (billing code 03867) Report writing: 335 total minutes. 12/25/23: 1:25-1:35pm. 12/26/2023: 12:35-1:15pm, 4:55-6:10pm. 12/28/2023: 2:50-3:10pm. 12/30/2023: 3:25-3:35pm. 01/05/2024: 8-9:25, 9:30-9:50am, 10-10:10am, 1:05-1:30pm, 1:45-1:55pm, and 3:05-3:35pm.   (billing code 03866 [6 units])  Plan: Rosina provided verbal consent for her evaluation to be sent via e-mail. No further follow-up planned by this provider.       CONFIDENTIAL PSYCHOLOGICAL EVALUATION ______________________________________________________________________________ Name: Cassandra Vazquez Date of Birth: 1986-03-11    Age: 18 Dates of Evaluation: 12/21/2023, 12/27/2023, 12/28/2023, and 01/05/2024  SOURCE AND REASON FOR REFERRAL: Ms. Jenine Krisher was referred by Rollene Northern, NP-C, for an evaluation to ascertain if she meets the criteria for Attention Deficit/Hyperactivity Disorder (ADHD).   EVALUATIVE PROCEDURES: Clinical Interview with Ms. Rosina Mose (12/21/2023) Wechsler Adult Intelligence Scale-Fourth Edition (WAIS-5; 12/28/2023) CNS Vital Signs (01/05/2024) Adult Attention Deficit/Hyperactivity Disorder  Self-Report Scale Checklist (01/05/2024) Behavior Rating Inventory for Executive Function - A - Self Report Behavior Rating Inventory for Executive Function - 2A - Self Report (BRIEF-2A; 12/27/2023) and Informant (12/27/2023) Personality Assessment Inventory (PAI; 12/27/2023) Patient Health Questionnaire-9 (PHQ-9) Generalized Anxiety Disorder-7 (GAD-7) Adult OCD Inventory (OCD-A) SF-20 (01/05/2024) Social Responsiveness Scale-2 (SRS-2; 12/21/2023)   BACKGROUND INFORMATION AND PRESENTING PROBLEM: Cassandra Vazquez  is a 38 year old female who resides in [place] .  Ms. Chad reported she was diagnosed with ADHD when in the "second or third grade," although she added her "parents were more on the holistic side," so they did not pursue medication management of her symptoms. She endorsed the following ADHD-related symptoms:  Symptoms Frequency   Often Occasionally Infrequent/No Significant Issues  Inattention Criterion (DSM-5-TR)     Making careless mistakes and missing small details.  She reported that this regularly happens on tasks she does not enjoy or find stimulating, and that forgetfulness and inattention contribute to it.     Being easily distracted by various stimuli and the mind often being elsewhere, even when no apparent distractions exist.  She described being easily distracted by various stimuli (e.g., sounds, movement, and irrelevant tasks and thoughts), and that her mind is often elsewhere even when no obvious distractions are present.    Trouble sustaining attention during conversations.  She stated this happens "all the time," attributing it to becoming distracted by irrelevant thoughts.      Does not follow through on instructions and fails to finish tasks.  She endorsed commonly experiencing task initiation, maintenance, and completion issues.     Difficulty organizing tasks and activities.  She frequently discussed having clutter in her home, being prone to haphazard task completion,  finishing tasks at the last minute, and having trouble sticking to plans because she loses the desire once the time arrives.     Avoids, dislikes, or is reluctant to engage in tasks that require sustained mental effort. She described herself as the "procrastination queen," and that she "does [her] best work" when "it is a crisis," indicating that urgency and concerns about negative repercussions are primary motivators to starting tasks she has put off.     Loses things necessary for tasks or activities.  She discussed being prone to forgetting needed items for tasks.     Forgetful in daily activities.  She also discussed a tendency to forget tasks and what she wanted to say.     Hyperactivity and Impulsivity Criterion (DSM-5-TR)     Fidgets with hands or feet or squirms in seat. She stated she uses "lot[s] of hand movements" while speaking, as well as habitually, and largely unconsciously, fidgets.     Leaves seat in situations in which remaining seated is expected.   She endorsed having trouble staying still while seated, but denied having significant difficulty with staying seated.  Feelings of restlessness. She described struggling when she "ha[s] nothing to do," and that she "always ha[s] to be doing something," which contributes to difficulty relaxing and sleep onset.      Difficulty playing or engaging in leisure activities quietly. She reported commonly making comments during leisure activities (e.g., during movies), which others have also commented on.     "On the go" or often acts as if "driven by a motor." She stated she often feels she must be moving or doing something, and that she experiences emotional distress when there is "nothing to do."     Talks excessively.  She stated she is prone to excessive talking, attributing it to a tendency to switch topics while speaking. She further stated how this symptom contributes to her "staying quiet" or "sharing very little" to prevent it from  occurring.      Interrupts others.  She shared that she commonly has urges to share something she "really, really wants to say" while someone is talking, which contributes to  a tendency to interrupt others. She noted how it takes significant effort not to act on these urges.      Impatience.  She discussed being impatient, sharing how when she is required to wait and/or when someone or something seems to be moving slower than necessary during a task, it can cause significant agitation. She also discussed how this causes her to engage in efforts to avoid lines and how it contributes to her interrupting others.      ADHD-Related Symptoms that Assist in Identifying ADHD but are not in the DSM-5-TR Jeanna, 2021, p. 6-12 and [phone])     Emotional dysregulation and overstimulation. She described herself as "reactive," irritable, impatient, and prone to feeling overwhelmed. She further explained how difficulties in sustaining her attention and being interrupted during a task contribute to feeling overwhelmed, and that she can become irritable if others are perceived as moving more slowly than needed.     Making decisions impulsively. She discussed often saying and purchasing things she later regrets, noting how it has led to relational and financial strains. She further discussed how it contributes to her feeling socially "awkward."    Tending to drive much faster than others. She stated she has received "many, many speeding tickets," and that she can be prone to driving 15 or more miles per hour over the speed limit if she believes law enforcement is not around.     Trouble following through on promises or commitments made to others.  She said this symptom commonly occurs and noted how she is prone to having significantly reduced drive to follow through on a plan when the time comes to do so.     Trouble doing things in proper order or sequence. She described it as "frustrating" to have to "keep looking at  instructions," which leads her to skip tasks and have trouble completing those that require a specific order.      Ms. Gear discussed a history of having possibly been diagnosed with "mild depression," noting how she had "a lot of life going on" at the time (e.g., a divorce), as well as how she has a "constant" low mood that she attributed to ADHD- and social-related difficulties; generalized anxiety (e.g., stating she has an "irrational fear" of being involved in a motor vehicle accident or of "dying), noting this anxiety seems to have occurred after she had children and that it may stem from concerns about her children's wellbeing if "something [was to] happen to [her];" social anxiety-related symptoms (e.g., regularly having concerns about her social functioning, "replaying conversations in [her] head," and experiencing emotional discomfort in an "intimate [social] setting"), and how these symptoms contribute to fatigue, efforts to hide her discomfort and various social difficulties, and avoiding "intimate" social settings; and obsessions and compulsions (e.g., liking things arranged in just a certain way, which can contribute to task initiation issues). She also discussed a history of autism spectrum disorder-related symptomatology that she described as including difficulty with "emotional closeness" and "emotionally intimate" conversations, adding that it "seems exhausting" and hard to "understand emotional intimacy," and that her mind "automatically goes to problem solv[ing]" when others are sharing emotionally difficult topics; describing herself as "acting the whole time" and engaging in efforts to mask various ADHD- and autism-related difficulties when in social settings, which contributes to fatigue; engaging in significant efforts to make sure things are a certain way (e.g., stating her "[clothing] closet is chromatic colors"); sensory-related concerns (e.g., having a strong dislike of being hugged  and  of specific colors, as well as a possible reduced sensitivity to pain); significant discomfort with "eye contact;" and others indicating she is being "insensitive" when she is not intentionally being insensitive but is "being [herself]." Ms. Maney expressed a belief that her ADHD-related symptoms have occurred since childhood and are consistent and independent of mood. She further described how she engages in various compensatory strategies to assist in reducing the effects of various ADHD-related symptoms, but that it takes significant effort for her to do so and she often "feel[s] [she is] not being [herself]."  Ms. Chad denied awareness of having ever experienced any developmental milestone delays, grade retention, learning disability diagnosis, or having an individualized education plan. She discussed throughout schooling she engaged in a "lot of talking [during class]," tended to be inattentive during class, was impulsive (e.g., "breaking a teacher's glasses" for seemingly no reason, as well as "spraying a fire extinguisher to see what was in it," which led to her being expelled), and that in high school her behaviors "leveled out" but she was on the "more promiscuous side." She indicated that the behaviors mentioned above regularly occurred because she was prone to boredom and had a desire for stimulation. She reported having natural academic strengths and being in the "top ten or five percent" of her class. She stated that she is currently in graduate school, and that her natural academic abilities have been helpful in her achieving "all As," but that "once [she] gets [a class topic]," it becomes harder for her to stay engaged with the class material. She further stated that she is prone to forgetting class material after a test and when the class has ended. She also stated she struggles with "research" and "discussions," attributing this to the lack of urgency and clear deadlines in these tasks. 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 She stated she is currently divorced secondary to "infidelity," "communication issues," and ADHD-related difficulties (e.g., inattention, forgetfulness, and hyperactivity)  that both of them experience.   Ms. Giraldo reported her medical history is unremarkable. She denied awareness of having ever experienced seizures or a head injury. She reported past use of psychotherapeutic services, although noted how she "reschedule[d] quite a bit," and that she currently utilizes Wellbutrin  for a reason she was unable to remember. She described Wellbutrin  as being limited in its efficacy but stated that "sometimes" there are "slight moments" "right when [she] take[s] it" where it seems to help with sustaining attention. She denied ever experiencing psychiatric hospitalization or meeting the full criteria for a hypomanic or manic episode. She stated her familial mental health history is significant for "bipolarism" (father); "mild schizophrenia," childhood diagnosis of ADHD, and a past psychiatric hospitalization (brother); various "men" on her mother's side have "autism;" and possible ADHD as she is "energetic," "all over the place," prone to interrupting others, and engaging in excessive talking (mother).  Chart Review: Per a 10/11/2023 MyChart message to Rollene Northern, FNP, Ms. Chad stated, "At our last visit, we had spoke about me possibly getting on Wellbutrin  or trying something different. Over the past few months I have been having some difficulty with my ADHD. Is there anything I can take to help with that?" Ms. Arnett responded, "We can retrial wellbutrin  off label for concentration as long as no history of seizure, anorexia, bulimia. You may also not drink alcohol on wellbutrin . Do you have formal diagnosis of  ADHD or would you like for to make that referral? Likely stimulant medication such as  adderall would be more effective. I cannot prescribe without formal diagnosis. Would you like to start wellbutrin  in the interim? We can start with wellbutrin  150mg  daily and increase to 300mg  after a week. You would need to monitor for increased anxiety, irritability." Ms. Wojcicki responded to this message saying, "My formal diagnosis is from childhood so I will need a referral. I can restart the wellbutrin  in the meantime," which led to Ms. Arnett referring her for an ADHD evaluation.    BEHAVIORAL OBSERVATIONS: Ms. Manka presented on time for the evaluation. She was well-groomed. She was oriented to the time, place, person, and purpose of the appointment. During the interview, Ms. Lovern was often tangential and occasionally gave answers before the evaluator had finished the question. During the evaluation, Ms. Renwick verbalized and/or demonstrated impulsivity (e.g., abruptly asking this evaluator if he was "from [place]" secondary to a perceived accent while instructions to a subtest were being provided and using various hand gestures while speaking) and working memory-related problems (e.g., closing her eyes and/or looking down in what appeared to be an effort to limit potential distractors, as well as utilizing her fingers to "try to see what [this evaluator] was saying"). Throughout the evaluation, she maintained appropriate eye contact. Her thought processes and content were logical, coherent, and goal-directed. There were no overt signs of a thought disorder or perceptual disturbances, nor did she report such symptomatology. There was no evidence of paraphasias (i.e., errors in speech, gross mispronunciations, and word substitutions), repetition deficits, or disturbances in volume or prosody (i.e., rhythm and intonation). Overall, based on Ms. Ellenwood's approach to testing, the current results are believed to be a fair estimate of her abilities.  PROCEDURAL CONSIDERATIONS: Psychological testing measures were  conducted through a virtual visit with video and audio capabilities, but otherwise in a standard manner.   The Wechsler Adult Intelligence Scale, Fifth Edition (WAIS-5) was administered via remote telepractice using digital stimulus materials on [employer]'s Q-global system. The remote testing environment appeared free of distractions, adequate rapport was established with the examinee via video/audio capabilities, and Ms. Gentz appeared appropriately engaged in the task throughout the session. No significant technological problems or distractions were noted during administration. Modifications to the standardization procedure included: none. The WAIS-5 subtests, or similar tasks, have received initial validation in several samples for remote telepractice and digital format administration, and the results are considered a valid description of Ms. Senna's skills and abilities.  CLINICAL FINDINGS:  COGNITIVE FUNCTIONING  Wechsler Adult Intelligence Scale, Fourth Edition (WAIS-5): Ms. Postma completed subtests of the WAIS-5, a full-scale measure of cognitive ability. The WAIS-5 comprises four indices that measure cognitive processes that are components of intellectual ability; however, only subtests from the Verbal Comprehension and Working Memory indices were administered. As a result, Full-Scale-IQ (FSIQ) and General Ability Index (GAI) could not be determined.   WAIS-5 Scale/Subtest Composite Score/Scaled Score 95% Confidence Interval Percentile Rank Qualitative Description  Verbal Comprehension (VCI) 108 100-115 70 Average  Similarities 11     Vocabulary 12     Working Memory (WMI) 103 96-110 58 Average  Digit Sequencing  11     Running Digits 10     Digits Forward 7       The Verbal Comprehension Index (VCI) measures one's ability to receive, comprehend, and express language. It also measures the ability to retrieve previously learned information and to understand relationships between words and  concepts presented orally. Ms. Ohlson obtained a VCI score of 108 (70th percentile), placing her in the average range compared to same-aged peers. Her performance on the subtests comprising this index was comparable.   The Working Memory Index (WMI) measures one's ability to sustain attention, concentrate, and exert mental control. Ms. Gatton obtained a WMI score of 103 (58th percentile), placing her in the average range compared to same-aged peers. Ms. Bai performance on the subtests varied. Her lowest performance was on the Digits Forward subtest. Her best performance was on the Digit Sequencing subtest. This performance may suggest that she may display better working memory ability when tasks are somewhat more effortful.   ATTENTION AND PROCESSING  CNS Vital Signs: The CNS Vital Signs assessment evaluates the neurocognitive status of an individual and covers a range of mental processes. The results of the CNS Vital Signs testing indicated low average neurocognitive processing ability. Attentional abilities ranged from very low to above, with complex attention in the very low range, simple attention in the average range, and sustained attention in the above range. Executive function and cognitive flexibility were very low. Working memory was average. Psychomotor speed, motor speed, and processing speed were average, which suggests average hand-eye coordination and thinking speed. Reaction time was low average. Visual memory (images) was above, and verbal memory (words) was average, which indicates visual memory is a relative strength. The results suggest Ms. Gasner experiences impairment in complex attention, cognitive flexibility, and executive function; weakness in reaction time; and strengths in visual memory and sustained attention. Upon follow-up, Ms. Chad described how she treated the CNS Vital Signs as a "game," which seemed to help her sustain her attention on portions of it.   Domain  Standard Score  Percentile Validity Indicator Guideline  Neurocognitive Index 80 9 Yes Low Average  Composite Memory 119 90 Yes Above  Verbal Memory 99 47 Yes Average  Visual Memory 133 99 Yes Above  Psychomotor Speed 105 63 Yes Average  Reaction Time 85 16 Yes Low Average  Complex Attention 43 1 Yes Very Low  Cognitive Flexibility 50 1 Yes Very Low  Processing Speed  108 70 Yes Average  Executive Function 52 1 Yes Very Low  Working Memory 109 73 Yes Average  Sustained Attention 112 79 Yes Above  Simple Attention 107 68 Yes Average  Motor Speed 102 55 Yes Average   EXECUTIVE FUNCTION  Behavior Rating Inventory of Executive Function, Second Edition Event organiser) Self-Report: Ms. Forman completed the Self-Report Form of the Behavior Rating Inventory of Executive Function-Adult Version, Second Edition [employer]), which has three domains that evaluate cognitive, behavioral, and emotional regulation, and a Global Executive Composite score provides an overall snapshot of executive functioning. There are no missing item responses in the protocol. The Infrequency and Inconsistency scales are not elevated, suggesting she did not respond to the protocol in a haphazard, extreme, or inconsistent manner. However, the Negativity scale was elevated, which suggests her view of herself may be excessively negative or that she is experiencing significant executive dysfunction. In the context of these validity considerations, ratings of Ms. Sciascia's everyday executive function suggest some areas of concern. The overall index score, the GEC, was highly elevated (GEC T = 90, %ile = >99). The Behavior Regulation Index (BRI), Emotion Regulation Index (ERI), and Cognitive Regulation Index (CRI) scores were all elevated (BRI T = 99, %ile = >99; ERI T = 90, %ile = >99, CRI T = 82, %ile = >99), suggesting self-regulatory problems in all measured  domains. More specifically, Ms. Chad indicated difficulty with her ability to resist impulses, be aware  of her functioning in social settings, adjust well to changes, react to events appropriately, get going on tasks and activities and independently generate ideas, sustain working memory, plan and organize her approach to problem solving appropriately, be appropriately cautious in her approach to tasks and check for mistakes, and keep materials and belongings reasonably well-organized. The elevated scores on the Shift and Emotional Control scales suggest she experiences significant problem-solving rigidity combined with emotional dysregulation, which may leave her prone to losing emotional control when her routine or perspective is challenged and/or flexibility is required. Moreover, the elevated scores on scales reflecting problems with fundamental behavioral and/or emotional regulation (Inhibit, Emotional Control, and Shift) suggest that more global problems with self-regulation are adversely affecting active cognitive problem solving (elevated CRI). Upon follow-up, Ms. Ulin expressed the belief that she was answering questions honestly on the BRIEF-2A.  Scale/Index  Raw Score T Score Percentile Qualitative Description  Inhibit 24 94 >99 Highly Elevated  Self-Monitor 18 93 >99 Highly Elevated  Behavior Regulation Index (BRI) 42 99 >99 Highly Elevated  Shift 16 82 >99 Highly Elevated  Emotional Control 23 86 >99 Highly Elevated  Emotion Regulation Index (ERI) 39 90 >99 Highly Elevated  Initiate 21 80 99 Highly Elevated  Working Memory 21 84 >99 Highly Elevated  Plan/Organize 19 75 99 Highly Elevated  Task Monitor 15 80 >99 Highly Elevated  Organization of Materials 19 73 99 Moderately Elevated  Cognitive Regulation Index (CRI) 95 82 >99 Highly Elevated  Global Executive Composite (GEC) 176 90 >99 Highly Elevated   Validity Scale Raw Score Cumulative Percentile Protocol Classification  Inconsistency 4 98 Acceptable  Negativity 7 >99 Elevated  Infrequency 0 98 Acceptable   Behavior Rating  Inventory of Executive Function, Second Edition Event organiser) Informant:  Ms. Achey friend, Concha Batter, completed the Informant Form of the Behavior Rating Inventory of Executive Function-Adult Version, Second Edition (BRIEF-2A), which is equivalent to the Self-Report version and has three domains that evaluate cognitive, behavioral, and emotional regulation, and a Global Executive Composite score provides an overall snapshot of executive functioning. There are no missing item responses in the protocol. The Negativity and Infrequency scales are not elevated, suggesting they did not respond to the protocol in an overly negative, haphazard, or extreme manner. However, the Inconsistency scale was elevated, indicating that ratings on the scales may not be internally consistent and that validity may be compromised. In the context of these validity considerations, Grayson's ratings of Ms. Gundry's everyday executive function suggest some areas of concern. The overall index score, the GEC, was within normal limits (GEC T = 58, %ile = 81). The Behavior Regulation Index (BRI), Emotion Regulation Index (ERI), and Cognitive Regulation Index (CRI) scores were within normal limits (BRI T = 57, %ile = 80; ERI T = 53, %ile = 73; CRI T = 57, %ile = 83). Concha indicated that Ms. 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 Concha did not describe her ability to resist impulses, be aware of her functioning in social settings, adjust well to changes, react to events appropriately, get going on tasks and activities and independently generate ideas, plan and organize their approach to problem solving appropriately, be appropriately cautious in her approach to tasks and check for mistakes, and keep materials and belongings reasonably well-organized as problematic. Upon follow-up, Ms. Chad shared that [place] experiences similar executive functioning issues as her, which may have led to them not  being "bother[ed]" by "certain  [executive functioning issues she experiences]."  Scale/Index  Raw Score T Score Percentile Qualitative Description  Inhibit 12 56 83 Within Normal Limits  Self-Monitor 9 54 82 Within Normal Limits  Behavior Regulation Index (BRI) 21 57 80 Within Normal Limits  Shift 7 47 56 Within Normal Limits  Emotional Control 13 56 82 Within Normal Limits  Emotion Regulation Index (ERI) 20 53 73 Within Normal Limits  Initiate 13 58 86 Within Normal Limits  Working Memory 16 68 97 Mildly Elevated  Plan/Organize 10 48 62 Within Normal Limits  Task-Monitor 9 54 77 Within Normal Limits  Organization of Materials 13 54 69 Within Normal Limits  Cognitive Regulation Index (CRI) 61 57 83 Within Normal Limits  Global Executive Composite (GEC) 102 58 81 Within Normal Limits   Validity Scale Raw Score Cumulative Percentile Protocol Classification  Inconsistency 8 >99 Inconsistent  Negativity 0 98 Acceptable  Infrequency 0 98 Acceptable   BEHAVIORAL FUNCTIONING   Patient Health Questionnaire-9 (PHQ-9): Ms. Cardy completed the PHQ-9, a self-report measure that assesses symptoms of depression. She scored 11/27, which indicates moderate depression. She endorsed thoughts of being better off dead or of hurting herself in some way on several days of the two weeks before she completed the measure.   Generalized Anxiety Disorder-7 (GAD-7): Ms. Milbourn completed the GAD-7, a self-report measure that assesses symptoms of anxiety. She scored 2/21, which indicates minimal anxiety.   Adult ADHD Self-Report Scale Symptom Checklist (ASRS): Ms. Kloos reported the following symptoms as sometimes: difficulty wrapping up the final details of a project after completing challenging aspects, and misplacing or having difficulty finding things. She endorsed the following symptoms as occurring often: difficulty getting things in order when a task requires organization, fidgeting or squirming, and being distracted by the noise around her,  feeling restless or fidgety, and interrupting others or finishing their sentences. She endorsed the following symptoms as very often: problems remembering appointments or obligations, avoiding or delaying getting started on tasks requiring a lot of thought, feeling overly active and compelled to do things, making careless mistakes when working on boring or complex projects, struggling to sustain attention when doing boring or repetitive work, struggling to concentrate on what people say even when they are speaking directly to her, difficulty relaxing, talking too much in social situations, difficulty waiting for her turn-taking situations, and interrupting others when they are busy. The endorsement of at least four items in Part A is highly consistent with ADHD in adults. The frequency scores of Part B provides additional cues. Ms. Krizan scored 6/6 on Part A and 10/12 on Part B, which is considered a positive screening for ADHD.   Adult OCD Inventory (OCD-A) SF-20: The OCD-A SF-20 was administered. Ms. Woodside scored a 90/300, which is indicative of mild problems with OCD-related concerns. She endorsed the following as "Yes, this stops me a little or wastes a little of my time": having thoughts or words repeat themselves over and over in her mind, liking to eat the same foods, and having to check things several times. She endorsed the following as "Yes, this stops me from doing other things or wastes some of my time": trouble making up her mind, arranging things in certain ways or symmetrical, and having to put things away just right. She endorsed the following as "Yes, this stops me from doing a lot of things and wastes a lot of my time": getting angry if other people mess up their desk or  work area, feeling guilty over minor infractions, and feeling compelled to do things they don't really want to do.  Social Responsiveness Scale-2 (SRS-2): The SRS-2 is a self-report measure that identifies and examines the  severity of social impairment commonly associated with Autism Spectrum Disorder (ASD). Ms. Tirone indicated experiencing social communication and interaction problems (SCI T = 82) that include severe impairment in identifying (AWR T = 83) and interpreting (COG T = 77) social cues and engaging in expressive social communication (COM T = 80) as well as low motivation to engage in social-interpersonal behavior (MOT T = 76) that is likely causing significant and enduring interference with everyday social interactions. She also indicated having severely restricted interests and engagement in repetitive behaviors (RRB T = 90). Her endorsing severe social communication and interaction impairment, as well as having restricted interests and repetitive subscales, is indicative of her endorsing many of the components of Autism Spectrum Disorder.   Personality Assessment Inventory (PAI): The PAI is an objective inventory of adult personality. The validity indicators suggested Ms. Margolis's profile is interpretable (ICN T = 58, INF T = 55, NIM T = 55, and PIM T = 15). She is endorsing stress and worry (ANX T = 67, STR T = 71, and BOR-A T = 72) as well as unhappiness at least part of the time (DEP T = 68, DEP-A T = 61, and BOR-A T = 72) that includes ruminative worry and concerns (ANX-C T = 64) as well as thoughts of inadequacy (DEP-C T = 72) that likely impair concentration, tension and difficulty relaxing (ANX-A T = 75), the possibility of specific fears (ARD-P T = 65), vegetative signs of depression (e.g., sleep problems, appetite issues, and/or lack of drive; DEP-P T 62), and being fairly rigid in her personal guidelines for conduct, with changes in routine or unexpected events likely to generate untoward stress (ARD-O T = 68); an activity level that is noticeably high even to casual observers (MAN-A T = 67), as well as impulsivity and recklessness in areas that have a high potential for negative consequences (BOR-S T = 88 and  ANT-S T = 83); problematic alcohol use (ALC T = 82); impatience and being easily frustrated to an extent that relationships with others are probably under stress (MAN-I T = 74 and AGG-A T = 60); and a history of difficulties with authority and social convention (ANT-A T = 84).  She also endorsed neither desiring or enjoying close relationships, with social isolation and detachment possibly serving to decrease the sense of discomfort that interpersonal contact fosters (SCZ-S T = 76, ANT-E T = 62, and WRM T =21 ), that may be at least partially explained by her experiencing a loosening of associations and increased difficulties in self-expression (SCZ-T T = 90), numerous problems in past attachment relationships (BOR-N T = 81), spending a great deal of time monitoring the environment for evidence that others are not trustworthy and may be trying to harm or discredit her in some way (PAR-H T = 83 and PAR-P T = 63), and/or preferring to approach relationships more passively (DOM T = 38).  She appears to acknowledge significant problems in functioning and perceives that help is needed in dealing with them (RXR T = 20).    SUMMARY AND CLINICAL IMPRESSIONS: Ms. Pates is a 38 year old female who was referred by Rollene Northern, NP-C, for an evaluation to determine if she currently meets criteria for a diagnosis of Attention-Deficit/Hyperactivity Disorder (ADHD).   Ms. Fritcher reported she  was diagnosed with ADHD when in the "second or third grade," although stated her "parents were more on the holistic side" and did not pursue medication management of her symptoms. She expressed a belief that her ADHD-related symptoms have occurred since childhood and are consistent and independent of mood. She described how she engages in various compensatory strategies to assist in reducing the effects of various ADHD-related symptoms, but that it takes significant effort for her to do so, and she often "feels [s] [she is] not being  [herself]."  Ms. Ahn was administered assessments during the evaluation to measure her current cognitive abilities. Her verbal comprehension abilities were in the average range, and comparably developed. Her ability to sustain attention, concentrate, and exert mental control was also in the average range, although her performance on the subtests varied. Her best performance was on the Digit Sequencing subtest. This performance may suggest she displays better working memory ability when tasks are somewhat more effortful. Results of the CNS Vital Signs indicated a low average neurocognitive processing ability, with impairment in complex attention, cognitive flexibility, and executive function; weakness in reaction time; and strengths in visual memory and sustained attention.  During the clinical interview and on self-report measures, Ms. Chad endorsed executive functioning concerns that include attentional dysregulation, hyperactivity- and impulsivity-related symptoms, and meeting the full criteria for ADHD. However, her BRIEF-2A results had an elevated Negativity scale. Moreover, her friend, Mr. Mardy, denied perceiving her as experiencing executive functioning issues, although his BRIEF-2A Informant results had an elevated Inconsistency scale. The aforementioned discrepancy and concerns about validity make creating a diagnostic decision difficult. Ultimately, this evaluator placed more emphasis on her clinical interview information, behavioral observations, and measures that were deemed valid (e.g., ASRS, CNS, and PAI). When considering self-reported symptoms and behavioral observations; endorsed and/or demonstrated impairment or weakness on measures of attention, executive functioning, and reaction time; a reported past diagnosis of ADHD; and a reported familial history of ADHD, a diagnosis of F90.2 Attention-Deficit/Hyperactivity Disorder, Combined Presentation, Moderate appears warranted. The specifier of  "Moderate" was assigned as she endorsed symptoms in excess of what is needed to make the diagnosis, and indicated they cause impairment or weakness in her academic (e.g., regularly having trouble sustaining her attention and interest in classes, as well as engaging in disruptive behaviors), social (e.g., frequently engaging in excessive talking and interrupting others), and daily (e.g., regularly experiencing being easily distracted, task initiation and completion issues, disorganization, and forgetfulness) functioning.  Ms. Arey also discussed a history of having possibly been diagnosed with "mild depression," and noting she has a "constant" low mood that she attributed to ADHD- and social-related difficulties; generalized anxiety-related symptoms; social anxiety-related that contributes to fatigue, efforts to mask her discomfort and various social problems, and avoiding "intimate" social settings; obsessions and compulsions; and autism spectrum disorder-related symptomatology. The PHQ-9, GAD-7, OCD-A, SRS-2, and PAI were administered. Her results suggested she experiences moderate depression-related symptoms, minimal anxiety-related symptoms, mild OCD-related concerns, and many of the components of autism spectrum disorder. However, given the limited scope of this evaluation, this evaluator was unable to determine if the full criteria for depressive disorder, anxiety disorder(s), OCD, and autism spectrum disorder are met, or if potential autism spectrum disorder and/or her diagnosis of ADHD better explain the symptoms. She would likely benefit from further evaluation of these symptoms to definitively rule in or out the aforementioned disorders. Should any of the aforementioned be ruled in, they would likely be in addition to her diagnosis of ADHD, as she  described her ADHD-related concerns as occurring before some of the concerns, as consistent across situations, and independent of mood and intrusive thoughts.  Moreover, autism spectrum disorder does not appear to explain her ADHD symptoms better, as she endorsed ADHD-related symptoms less commonly associated with autism spectrum disorder (e.g., regularly engaging in excessive talking and interrupting others, being distracted by various stimuli [versus primarily internal], and impulsivity).   DSM-5 Diagnostic Impressions: F90.2 Attention-Deficit/Hyperactivity Disorder, Combined Presentation, Moderate   RECOMMENDATIONS: Ms. Grennan would likely benefit from the completion of an autism spectrum disorder evaluation to rule in or out the disorder definitively. Ms. Soman would also likely benefit from a consultation regarding medication for ADHD symptoms.   Individual therapeutic services may assist in processing a diagnosis of ADHD and discussing coping and compensatory strategies. Ms. Woolverton would likely benefit from making use of strategies for ADHD symptoms:  Setting a timer to complete tasks. Break tasks into manageable chunks and spread them out over more extended periods with breaks.  Utilizing lists and day calendars to keep track of tasks.  Answering emails daily.  Improve listening skills by asking the speaker to give information in smaller chunks and asking for explanations and clarification as needed. Leaving more than the anticipated time to complete tasks. It may help to keep tasks brief, well within your attention span, and a mix of both high and low-interest tasks. Tasks may be gradually increased in length. Practice proactive planning by setting aside time every evening to plan for the next day (e.g., prepare needed materials or pack the car the night before).  Learn how to make a practical and reasonable "to-do" list of important tasks and priorities, and always keep it easily accessible. Make additional copies in case it is lost or misplaced. Utilize visual reminders by posting appointments, "to-do lists," or schedules in strategic areas at home  and work.  Practice using an appointment book, smartphone, or other tech device, or a daily planning calendar, and learn to write down appointments and commitments immediately. Keep notepads or use a portable audio recorder to capture important ideas that would be beneficial to recall later. Learn and practice time management skills. Purchase a programmable alarm watch or set an alarm on a smartphone to avoid losing track of time.  Use a color-coded file system, desk and closet organizers, storage boxes, or other organization devices to reduce clutter and improve efficiency and structure.  Implement ways to become more aware of your actions and to inhibit or adjust them as warranted (e.g., reviewing videos of your actions, considering consequences of obeying or not obeying the rules of various upcoming situations, having a trusted other to discuss plans with, and/or provide cues to stop certain behaviors, and make visual cues for rules you would like to follow). The 4Rs: Read just one paragraph, recite out loud in a soft voice or whisper what was important in that material, write that material down in a notebook, then review what you just wrote. Stay flexible and be prepared to change your plans, as symptom breakthroughs and crises will likely occur periodically. Ms. Fehrman may benefit from mindfulness training to address symptoms of inattention.  Mental alertness/energy can be raised by increasing exercise; improving sleep; eating a healthy diet; and managing stress. Consulting with a physician regarding any changes to the physical regimen is recommended. "Failing at Normal: An ADHD Success Story" by Harlene Solar is a great overview of ADHD. Dr. Nelwyn Pica also has a YouTube channel called "Nelwyn Pica, PhD -  Dedicated to ADHD Science+" with helpful videos on ADHD-related topics: [email or web address]  Applications:  RescueTime. Tracks your activities on your phone  and/or computer to determine how productive you have been and what distracted you. Free two-week trial.  [email or web address] . It uses engineered audio that may reduce distractions and assist with focus. Free 15-day trial. Freedom. Allows you to highlight days and times you want to block yourself from certain sites or apps. Free trial. Merrily.  It allows you to input your bank accounts and creates a visual layout of information about your financial goals, budget management, alerts, etc. May offer a free trial. Boomerang. It allows you to schedule times an email is sent and to see if others have received or opened your email. Ten messages free per month and a free trial of the premium version. IFTTT. Uses "channels" to create various actions (e.g., if you are mentioned in an email, highlight it in your inbox, and if you miss a call, add it to a to-do list). Free and premium versions. Unroll.me. Cleans up your email by unsubscribing from what you do not want to receive while still getting everything you do. Free. Finish. It allows you to divide two-list tasks into short-term, mid-term, and long-term tasks and determine how much time is left for a task. Focus mode hides non-priority tasks.  Autosilent. Turns your phone ringer on and off based on specified calendars, geo-fences, timers, etc. $3.99. Freakyalarm. It makes you solve math problems to disable an alarm. $1.99. Wake N Shake. It makes you vigorously shake your phone to stop the alarm. $.99. Todoist. It allows you to add sub-tasks to tasks and includes email and web plugins to make it work across the system. Premium has location-based reminders, calendar sync, productive tracking, etc.  Sleep Cycle. Utilize your phone's motion sensors to catch movement while you are asleep. The alarm will wake you as early as 30 minutes before your alarm based on your lightest sleep phase and show you how daily activities affect your sleep quality.  Books: "Taking Charge of  Adult ADHD Second Edition" by Dr. Nelwyn Pica "The ADHD Effect on Marriage" by Eleanor Bowers "The Couples Guide to Thriving with ADHD" by Melissa Orlov "Survival Guide for College Students with ADHD and LD" by Dr. Rollo Fess Organizations that are a reliable source of information on ADHD:  Children and Adults with Attention-Deficit/Hyperactivity Disorder (CHADD): chadd.org  Attention Deficit Disorder Association (ADDA): HotterNames.de ADD Resources: addresources.org ADD WareHouse: addwarehouse.com World Federation of ADHD: adhd-federation.org ADDConsults: addconsults.com Compilation of ADHD resources: https://www.harrell.com/ Future evaluation, if deemed necessary, and/or to determine the effectiveness of recommended interventions.   Frederic Fire, Psy.D. Licensed Psychologist - [facility] [phone]   References  Pica, R. A. (2021). Taking charge of adult ADHD: proven strategies to succeed at work, at   home, and in relationships (pp. 6-10 and 272-276). [employer].              Frederic ONEIDA Fire, PsyD

## 2024-02-02 ENCOUNTER — Ambulatory Visit: Admitting: Clinical

## 2024-02-08 ENCOUNTER — Other Ambulatory Visit: Admitting: Clinical

## 2024-02-14 ENCOUNTER — Ambulatory Visit: Admitting: Clinical

## 2024-02-21 ENCOUNTER — Ambulatory Visit

## 2024-02-21 DIAGNOSIS — Z113 Encounter for screening for infections with a predominantly sexual mode of transmission: Secondary | ICD-10-CM

## 2024-02-21 LAB — HM HIV SCREENING LAB: HM HIV Screening: NEGATIVE

## 2024-02-21 LAB — WET PREP FOR TRICH, YEAST, CLUE
Clue Cell Exam: NEGATIVE
Trichomonas Exam: NEGATIVE
Yeast Exam: NEGATIVE

## 2024-02-21 NOTE — Progress Notes (Signed)
 [facility] Department STI clinic [address] [place] [address] Main phone: [phone]  STI screening visit  Subjective:  Cassandra Vazquez is a 38 y.o. female being seen today for an STI screening visit. The patient reports they do have symptoms.    Patient reports they are not pregnant . They do not desire a pregnancy in the next year. Patient is currently using IUD (or IUS) to prevent pregnancy. They reported they are not interested in discussing contraception today.    No LMP recorded. (Menstrual status: IUD).  Patient has the following medical conditions:  Patient Active Problem List   Diagnosis Date Noted   HLD (hyperlipidemia) 08/15/2023   Epigastric pain 07/25/2023   Vaginitis 02/07/2023   Cervical cancer screening 01/06/2023   B12 deficiency 03/27/2021   Sinusitis 07/08/2020   Elevated WBC count 12/17/2019   Birth control counseling 11/02/2019   Vitamin D  deficiency 02/22/2018   Depression, recurrent 09/02/2017   Fatigue 09/02/2017   Routine physical examination 11/08/2016   Obesity 10/21/2016   Chief Complaint  Patient presents with   SEXUALLY TRANSMITTED DISEASE    Pt is here STD screening and has no symptoms   HPI Patient reports new sexual partner 2 weeks ago. She is having some discharge that is not her norm for the last few days.  Does the patient using douching products? No  See flowsheet for further details and programmatic requirements Hyperlink available at the top of the signed note in blue.  Flow sheet content below:  Pregnancy Intention Screening Does the patient want to become pregnant in the next year?: No Does the patient's partner want to become pregnant in the next year?: No Would the patient like to discuss contraceptive options today?: No Reason For STD Screen STD Screening: Has symptoms Have you ever had an STD?: Yes History of Antibiotic use in the past 2 weeks?: No STD Symptoms Denies all:  No Genital Itching: No Lower abdominal pain: No Discharge: Yes Dysuria: No Genital ulcer / lesion: No Rash: No Vaginal irritation: No Oral / Other skin ulcer: No Pain with sex: No Sore Throat: No Visual Changes: No Vaginal Bleeding: No Risk Factors for Hep B Household, sexual, or needle sharing contact of a person infected with Hep B: No Sexual contact with a person who uses drugs not as prescribed?: No Currently or Ever used drugs not as prescribed: No HIV Positive: No PRep Patient: No Men who have sex with men: No Have Hepatitis C: No History of Incarceration: No History of Homeslessness?: No Anal sex following anal drug use?: No Risk Factors for Hep C Currently using drugs not as prescribed: No Sexual partner(s) currently using drugs as not prescribed: No History of drug use: No HIV Positive: No People with a history of incarceration: No People born between the years of 17 and 1965: No   Screening for MPX risk:  Unexplained rash?  No   MSM?  No   Multiple or anonymous sex partners?  No   Any close or sexual contact with a person  diagnosed with MPX?  No   Any outside the [place]  where MPX is endemic?  No   High clinical suspicion for MPX?    -Unlikely to be chickenpox    -Lymphadenopathy    -Rash that presents in same phase of       evolution on any given body part  No   Screenings: Last HIV test per patient/review of record was  Lab Results  Component Value Date   HMHIVSCREEN Negative - Validated 11/08/2023    Lab Results  Component Value Date   HIV NON-REACTIVE 07/25/2023     Last HEPC test per patient/review of record was No results found for: HMHEPCSCREEN No components found for: HEPC   Last HEPB test per patient/review of record was No components found for: HMHEPBSCREEN   Patient reports last pap was:   No results found for: SPECADGYN Result Date Procedure Results Follow-ups  01/06/2023 Cytology - PAP High risk HPV: Negative Adequacy:  Satisfactory for evaluation; transformation zone component PRESENT. Diagnosis: - Negative for intraepithelial lesion or malignancy (NILM) Comment: Normal Reference Range HPV - Negative   05/07/2022 Cytology - PAP( [place]) High risk HPV: Negative Neisseria Gonorrhea: Negative Chlamydia: Negative Trichomonas: Negative Adequacy: Satisfactory for evaluation; transformation zone component PRESENT. Diagnosis: - Negative for intraepithelial lesion or malignancy (NILM) Comment: Normal Reference Range HPV - Negative Comment: Normal Reference Range Trichomonas - Negative Comment: Normal Reference [place] Chlamydia - Negative Comment: Normal Reference Range Neisseria Gonorrhea - Negative   11/02/2019 Cytology - PAP High risk HPV: Negative Adequacy: Satisfactory for evaluation; transformation zone component PRESENT. Diagnosis: - Negative for intraepithelial lesion or malignancy (NILM) Comment: Normal Reference Range HPV - Negative   05/19/2016 Cytology - PAP Pap: Negative for intraephithelial lesion or malignancy    Immunization history:  Immunization History  Administered Date(s) Administered   Moderna Sars-Covid-2 Vaccination 06/11/2019, 07/09/2019   PPD Test 11/08/2016   Tdap 11/04/2016    The following portions of the patient's history were reviewed and updated as appropriate: allergies, current medications, past medical history, past social history, past surgical history and problem list.  Objective:  There were no vitals filed for this visit.  Physical Exam Vitals and nursing note reviewed.  Constitutional:      Appearance: Normal appearance.  HENT:     Head: Normocephalic.     Mouth/Throat:     Mouth: Mucous membranes are moist.  Cardiovascular:     Rate and Rhythm: Normal rate.  Pulmonary:     Effort: Pulmonary effort is normal.  Abdominal:     Palpations: Abdomen is soft.  Genitourinary:    Comments: Politely declined genital exam. Self swabbed Musculoskeletal:         General: Normal range of motion.  Lymphadenopathy:     Cervical: No cervical adenopathy.     Upper Body:     Right upper body: No supraclavicular adenopathy.     Left upper body: No supraclavicular adenopathy.  Skin:    General: Skin is warm and dry.  Neurological:     Mental Status: She is alert and oriented to person, place, and time.  Psychiatric:        Mood and Affect: Mood normal.    Assessment and Plan:  Lilyona Richner is a 38 y.o. female presenting to the [facility] Department for STI screening  1. Screening for venereal disease (Primary)  - Chlamydia/Gonorrhea [place] [facility] - Gonococcus culture - WET PREP FOR TRICH, YEAST, CLUE - HIV [place] [facility] - Syphilis Serology,  Lab   Discussed HIV test will not reflect sex with new partner 2 weeks ago, advised re-testing for HIV as test not accurate until at least 3 weeks after exposure.  Patient accepted the following screenings: oral GC culture, vaginal CT/GC swab, vaginal wet prep, HIV, and RPR Patient meets criteria for HepB screening? No. Ordered? no Patient meets criteria for HepC screening? No. Ordered? no  Treat wet  prep per standing order Discussed time line for [facility] results and that patient will be called with positive results and encouraged patient to call if she had not heard in 2 weeks.  Counseled to return or seek care for continued or worsening symptoms Recommended repeat testing in 3 months with positive results. Recommended condom use with all sex for STI prevention.   Return if symptoms worsen or fail to improve.  No future appointments.  Damien FORBES Satchel, NP

## 2024-02-21 NOTE — Progress Notes (Signed)
 Pt is here for STD screening. Wet prep results reviewed with patient and requires no treatment per SO. Opportunity given to patient to ask questions for any clarifications. Questions answered.  Condoms declined. Kwadwo Tonishia Steffy,RN.

## 2024-02-25 LAB — GONOCOCCUS CULTURE

## 2024-03-14 ENCOUNTER — Encounter: Payer: Self-pay | Admitting: Family

## 2024-03-27 ENCOUNTER — Telehealth (INDEPENDENT_AMBULATORY_CARE_PROVIDER_SITE_OTHER): Admitting: Family

## 2024-03-27 ENCOUNTER — Encounter: Payer: Self-pay | Admitting: Family

## 2024-03-27 VITALS — Ht 64.0 in | Wt 274.0 lb

## 2024-03-27 DIAGNOSIS — F339 Major depressive disorder, recurrent, unspecified: Secondary | ICD-10-CM

## 2024-03-27 DIAGNOSIS — F909 Attention-deficit hyperactivity disorder, unspecified type: Secondary | ICD-10-CM | POA: Insufficient documentation

## 2024-03-27 MED ORDER — AMPHETAMINE-DEXTROAMPHET ER 5 MG PO CP24: 5.0000 mg | ORAL_CAPSULE | Freq: Every morning | ORAL | 0 refills | Status: AC

## 2024-03-27 NOTE — Progress Notes (Signed)
 Virtual Visit via Video Note  I connected with Cassandra Vazquez on 03/27/2024 at  8:00 AM EST by a video enabled telemedicine application and verified that I am speaking with the correct person using two identifiers. Location patient: home Location provider: work  Persons participating in the virtual visit: patient, provider  I discussed the limitations of evaluation and management by telemedicine and the availability of in person appointments. The patient expressed understanding and agreed to proceed.  HPI: Feels well today. No complaints.  Here to discuss ADHD and [facility] medication evaluation September and October 2025.  Dx with ADHD as a a child  She never as a treated with medication.  She has been in contact within [facility] [place] [facility] office for evaluation of autism; she plans to make this appointment when schedule allows.  Endorses mild depression. She would like to treat ADHD  No personal h/o heart disease.  No h/o HTN Family h/o HTN   ROS: See pertinent positives and negatives per HPI.  EXAM:  VITALS per patient if applicable: Ht 5' 4 (1.626 m)   Wt 274 lb (124.3 kg) Comment: pt reported  Breastfeeding No   BMI 47.03 kg/m  BP Readings from Last 3 Encounters:  10/31/23 124/63  09/06/23 110/75  07/25/23 130/76   Wt Readings from Last 3 Encounters:  03/27/24 274 lb (124.3 kg)  09/06/23 287 lb (130.2 kg)  08/15/23 281 lb 6.4 oz (127.6 kg)      08/15/2023   11:35 AM 08/15/2023   11:23 AM 07/25/2023   11:03 AM  Depression screen PHQ 2/9  Decreased Interest 0 0 0  Down, Depressed, Hopeless 0 0 1  PHQ - 2 Score 0 0 1  Altered sleeping 0 0 1  Tired, decreased energy 0 0 1  Change in appetite 0 0 1  Feeling bad or failure about yourself  0 0 2  Trouble concentrating 0 0 0  Moving slowly or fidgety/restless 0 0 0  Suicidal thoughts 0 0 0  PHQ-9 Score 0  0  6   Difficult doing work/chores Not difficult at all Not difficult at all Somewhat difficult      Data saved with a previous flowsheet row definition      07/25/2023   11:03 AM 09/21/2022   11:04 AM  GAD 7 : Generalized Anxiety Score  Nervous, Anxious, on Edge 0 0  Control/stop worrying 0 0  Worry too much - different things 0 0  Trouble relaxing 0 0  Restless 0 0  Easily annoyed or irritable 0 0  Afraid - awful might happen 0 0  Total GAD 7 Score 0 0  Anxiety Difficulty Not difficult at all Not difficult at all      GENERAL: alert, oriented, appears well and in no acute distress  HEENT: atraumatic, conjunttiva clear, no obvious abnormalities on inspection of external nose and ears  NECK: normal movements of the head and neck  LUNGS: on inspection no signs of respiratory distress, breathing rate appears normal, no obvious gross SOB, gasping or wheezing  CV: no obvious cyanosis  MS: moves all visible extremities without noticeable abnormality  PSYCH/NEURO: pleasant and cooperative, no obvious depression or anxiety, speech and thought processing grossly intact  ASSESSMENT AND PLAN: Attention deficit hyperactivity disorder (ADHD), unspecified ADHD type Assessment & Plan: Reviewed dx from psychology. We discussed cardiovascular risks of stimulant medication. Discussed side effects. We opted to start adderall 5 mg XR and titrate.She plans to be evaluated to  ASD with psychology office once her schedule allows. Will follow.   Orders: -     Amphetamine-Dextroamphet ER; Take 1 capsule (5 mg total) by mouth every morning.  Dispense: 30 capsule; Refill: 0  Depression, recurrent Assessment & Plan: Discussed suboptimal control of depression.  She prefers to start and monitor for symptoms at follow-up.  Continue Wellbutrin  150 mg daily      -we discussed possible serious and likely etiologies, options for evaluation and workup, limitations of telemedicine visit vs in person visit, treatment, treatment risks and precautions. Pt prefers to treat via telemedicine empirically  rather then risking or undertaking an in person visit at this moment.    I discussed the assessment and treatment plan with the patient. The patient was provided an opportunity to ask questions and all were answered. The patient agreed with the plan and demonstrated an understanding of the instructions.   The patient was advised to call back or seek an in-person evaluation if the symptoms worsen or if the condition fails to improve as anticipated.  Advised if desired AVS can be mailed or viewed via MyChart if Mychart user.   Rollene Northern, FNP

## 2024-03-27 NOTE — Assessment & Plan Note (Signed)
 Discussed suboptimal control of depression.  She prefers to start and monitor for symptoms at follow-up.  Continue Wellbutrin  150 mg daily

## 2024-03-27 NOTE — Patient Instructions (Addendum)
 Start  adderall 5mg   After 5-7 days may increase to 10mg  total daily if not effective  Monitor for increased anxiety, trouble sleeping, increase in heart rate or blood pressure.   Amphetamine; Dextroamphetamine Tablets What is this medication? AMPHETAMINE; DEXTROAMPHETAMINE (am FET a meen; dex troe am FET a meen) treats attention-deficit hyperactivity disorder (ADHD). It works by improving focus and reducing impulsive behavior. It may also be used to treat narcolepsy. It works by promoting wakefulness. It belongs to a group of medications called stimulants. This medicine may be used for other purposes; ask your health care provider or pharmacist if you have questions. COMMON BRAND NAME(S): Adderall What should I tell my care team before I take this medication? They need to know if you have any of these conditions: Anxiety or panic attacks Circulation problems in fingers or toes (Raynaud syndrome) Glaucoma Heart attack Heart disease High blood pressure Kidney disease Liver disease Mental health conditions Seizures Stroke Substance use disorder Suicidal thoughts, plans, or attempt by you or a family member Thyroid disease Tourette syndrome An unusual or allergic reaction to dextroamphetamine, other medications, foods, dyes, or preservatives Pregnant or trying to get pregnant Breastfeeding How should I use this medication? Take this medication by mouth. Take it as directed on the prescription label at the same time every day. You can take it with or without food. If it upsets your stomach, take it with food. Keep taking it unless your care team tells you to stop. A special MedGuide will be given to you by the pharmacist with each prescription and refill. Be sure to read this information carefully each time. Talk to your care team about the use of this medication in children. While it may be prescribed for children as young as 3 years for selected conditions, precautions do  apply. Overdosage: If you think you have taken too much of this medicine contact a poison control center or emergency room at once. NOTE: This medicine is only for you. Do not share this medicine with others. What if I miss a dose? If you miss a dose, take it as soon as you can. If it is almost time for your next dose, take only that dose. Do not take double or extra doses. What may interact with this medication? Do not take this medication with any of the following: Linezolid MAOIs, such as Marplan, Nardil, and Parnate Methylene blue This medication may also interact with the following: Acetazolamide Alcohol Ascorbic acid Certain medications for depression, anxiety, or other mental health conditions Certain medications for migraines, such as sumatriptan Guanethidine Opioids Reserpine Sodium bicarbonate St. John's wort Thiazide diuretics, such as chlorothiazide Tryptophan This list may not describe all possible interactions. Give your health care provider a list of all the medicines, herbs, non-prescription drugs, or dietary supplements you use. Also tell them if you smoke, drink alcohol, or use illegal drugs. Some items may interact with your medicine. What should I watch for while using this medication? Visit your care team for regular checks on your progress. Tell your care team if your symptoms do not start to get better or if they get worse. This medication requires a new prescription from your care team every time it is filled at the pharmacy. This medication can be abused and cause your brain and body to depend on it after high doses or long term use. Your care team will assess your risk and monitor you closely during treatment. Long term use of this medication may cause your brain  and body to depend on it. You may be able to take breaks from this medication during weekends, holidays, or summer vacations. Talk to your care team about what works for you. If your care team wants you  to stop this medication permanently, the dose may be slowly lowered over time to reduce the risk of side effects. Tell your care team if this medication loses its effects, or if you feel you need to take more than the prescribed amount. Do not change your dose without talking to your care team. Do not take this medication close to bedtime. It may prevent you from sleeping. Loss of appetite is common when starting this medication. Eating small, frequent meals or snacks can help. Talk to your care team if appetite loss persists. Children should have height and weight checked often while taking this medication. Tell your care team right away if you notice unexplained wounds on your fingers and toes while taking this medication. You should also tell your care team if you experience numbness or pain, changes in the skin color, or sensitivity to temperature in your fingers or toes. Contact your care team right away if you have an erection that lasts longer than 4 hours or if it becomes painful. This may be a sign of a serious problem and must be treated right away to prevent permanent damage. What side effects may I notice from receiving this medication? Side effects that you should report to your care team as soon as possible: Allergic reactions--skin rash, itching, hives, swelling of the face, lips, tongue, or throat Heart attack--pain or tightness in the chest, shoulders, arms, or jaw, nausea, shortness of breath, cold or clammy skin, feeling faint or lightheaded Heart rhythm changes--fast or irregular heartbeat, dizziness, feeling faint or lightheaded, chest pain, trouble breathing Increase in blood pressure Irritability, confusion, fast or irregular heartbeat, muscle stiffness, twitching muscles, sweating, high fever, seizure, chills, vomiting, diarrhea, which may be signs of serotonin syndrome Mood and behavior changes--anxiety, nervousness, confusion, hallucinations, irritability, hostility, thoughts  of suicide or self-harm, worsening mood, feelings of depression Prolonged or painful erection Raynaud syndrome--cool, numb, or painful fingers or toes that may change color from pale, to blue, to red Seizures Stroke--sudden numbness or weakness of the face, arm, or leg, trouble speaking, confusion, trouble walking, loss of balance or coordination, dizziness, severe headache, change in vision Side effects that usually do not require medical attention (report these to your care team if they continue or are bothersome): Dry mouth Headache Loss of appetite with weight loss Nausea Stomach pain Trouble sleeping This list may not describe all possible side effects. Call your doctor for medical advice about side effects. You may report side effects to FDA at 1-800-FDA-1088. Where should I keep my medication? Keep out of the reach of children and pets. This medication can be abused. Keep it in a safe place to protect it from theft. Do not share it with anyone. It is only for you. Selling or giving away this medication is dangerous and against the law. Store at room temperature between 20 and 25 degrees C (68 and 77 degrees F). Protect from light and moisture. Keep container tightly closed. Get rid of any unused medication after the expiration date. This medication may cause harm and death if it is taken by other adults, children, or pets. It is important to get rid of the medication as soon as you no longer need it, or it is expired. You can do this in two ways:  Take the medication to a medication take-back program. Check with your pharmacy or law enforcement to find a location. If you cannot return the medication, check the label or package insert to see if the medication should be thrown out in the garbage or flushed down the toilet. If you are not sure, ask your care team. If it is safe to put it in the trash, take the medication out of the container. Mix the medication with cat litter, dirt, coffee  grounds, or other unwanted substance. Seal the mixture in a bag or container. Put it in the trash. NOTE: This sheet is a summary. It may not cover all possible information. If you have questions about this medicine, talk to your doctor, pharmacist, or health care provider.  2024 Elsevier/Gold Standard (2023-02-25 00:00:00)

## 2024-03-27 NOTE — Assessment & Plan Note (Addendum)
 Reviewed dx from psychology. We discussed cardiovascular risks of stimulant medication. Discussed side effects. We opted to start adderall 5 mg XR and titrate.She plans to be evaluated to ASD with psychology office once her schedule allows. Will follow.

## 2024-04-11 ENCOUNTER — Ambulatory Visit: Admitting: Family

## 2024-04-12 ENCOUNTER — Ambulatory Visit: Admitting: Clinical
